# Patient Record
Sex: Female | Born: 1970 | Race: White | Hispanic: No | Marital: Single | Smoking: Current every day smoker
Health system: Southern US, Community
[De-identification: ages and names within clinical notes are randomized; demographics above are authoritative.]

## PROBLEM LIST (undated history)

## (undated) DIAGNOSIS — IMO0001 Reserved for inherently not codable concepts without codable children: Secondary | ICD-10-CM

## (undated) DIAGNOSIS — R918 Other nonspecific abnormal finding of lung field: Secondary | ICD-10-CM

## (undated) DIAGNOSIS — K861 Other chronic pancreatitis: Secondary | ICD-10-CM

## (undated) DIAGNOSIS — K219 Gastro-esophageal reflux disease without esophagitis: Secondary | ICD-10-CM

## (undated) DIAGNOSIS — S2239XA Fracture of one rib, unspecified side, initial encounter for closed fracture: Secondary | ICD-10-CM

## (undated) DIAGNOSIS — F419 Anxiety disorder, unspecified: Secondary | ICD-10-CM

## (undated) DIAGNOSIS — N390 Urinary tract infection, site not specified: Secondary | ICD-10-CM

## (undated) DIAGNOSIS — S060X9A Concussion with loss of consciousness of unspecified duration, initial encounter: Secondary | ICD-10-CM

## (undated) DIAGNOSIS — S2241XA Multiple fractures of ribs, right side, initial encounter for closed fracture: Secondary | ICD-10-CM

## (undated) DIAGNOSIS — C3490 Malignant neoplasm of unspecified part of unspecified bronchus or lung: Secondary | ICD-10-CM

## (undated) HISTORY — PX: TONSILLECTOMY: SUR1361

## (undated) HISTORY — PX: EYE MUSCLE SURGERY: SHX370

## (undated) HISTORY — DX: Malignant neoplasm of unspecified part of unspecified bronchus or lung: C34.90

## (undated) HISTORY — PX: MYRINGOTOMY WITH TUBE PLACEMENT: SHX5663

## (undated) HISTORY — PX: ESOPHAGOGASTRODUODENOSCOPY: SHX1529

---

## 1998-03-18 ENCOUNTER — Other Ambulatory Visit: Admission: RE | Admit: 1998-03-18 | Discharge: 1998-03-18 | Payer: Self-pay | Admitting: Obstetrics and Gynecology

## 2006-01-08 ENCOUNTER — Emergency Department: Payer: Self-pay | Admitting: Internal Medicine

## 2006-01-09 ENCOUNTER — Emergency Department: Payer: Self-pay | Admitting: Internal Medicine

## 2006-01-12 ENCOUNTER — Emergency Department: Payer: Self-pay | Admitting: Emergency Medicine

## 2006-01-16 ENCOUNTER — Emergency Department: Payer: Self-pay | Admitting: Emergency Medicine

## 2008-11-05 ENCOUNTER — Ambulatory Visit: Payer: Self-pay | Admitting: Urology

## 2009-09-02 ENCOUNTER — Ambulatory Visit: Payer: Self-pay

## 2009-09-07 ENCOUNTER — Ambulatory Visit: Payer: Self-pay

## 2010-02-17 ENCOUNTER — Other Ambulatory Visit: Payer: Self-pay | Admitting: Family

## 2010-03-03 ENCOUNTER — Ambulatory Visit: Payer: Self-pay | Admitting: Gastroenterology

## 2011-01-26 ENCOUNTER — Ambulatory Visit: Payer: Self-pay

## 2011-02-13 ENCOUNTER — Other Ambulatory Visit: Payer: Self-pay | Admitting: Family Medicine

## 2011-04-12 ENCOUNTER — Ambulatory Visit: Payer: Self-pay

## 2011-05-18 ENCOUNTER — Ambulatory Visit: Payer: Self-pay | Admitting: Surgery

## 2011-05-23 LAB — PATHOLOGY REPORT

## 2012-12-23 ENCOUNTER — Emergency Department: Payer: Self-pay | Admitting: Emergency Medicine

## 2012-12-29 ENCOUNTER — Emergency Department: Payer: Self-pay | Admitting: Internal Medicine

## 2013-05-22 ENCOUNTER — Ambulatory Visit: Payer: Self-pay | Admitting: Gastroenterology

## 2013-05-28 ENCOUNTER — Ambulatory Visit: Payer: Self-pay | Admitting: Gastroenterology

## 2013-08-14 ENCOUNTER — Emergency Department: Payer: Self-pay | Admitting: Emergency Medicine

## 2013-08-22 ENCOUNTER — Emergency Department: Payer: Self-pay | Admitting: Emergency Medicine

## 2013-08-22 LAB — CBC WITH DIFFERENTIAL/PLATELET
Basophil #: 0.1 10*3/uL (ref 0.0–0.1)
Basophil %: 0.4 %
EOS ABS: 0 10*3/uL (ref 0.0–0.7)
EOS PCT: 0.2 %
HCT: 48.2 % — AB (ref 35.0–47.0)
HGB: 16.2 g/dL — AB (ref 12.0–16.0)
LYMPHS ABS: 1 10*3/uL (ref 1.0–3.6)
LYMPHS PCT: 6.7 %
MCH: 35.1 pg — ABNORMAL HIGH (ref 26.0–34.0)
MCHC: 33.5 g/dL (ref 32.0–36.0)
MCV: 105 fL — ABNORMAL HIGH (ref 80–100)
MONO ABS: 1 x10 3/mm — AB (ref 0.2–0.9)
MONOS PCT: 6.9 %
NEUTROS ABS: 13 10*3/uL — AB (ref 1.4–6.5)
NEUTROS PCT: 85.8 %
PLATELETS: 241 10*3/uL (ref 150–440)
RBC: 4.61 10*6/uL (ref 3.80–5.20)
RDW: 13.2 % (ref 11.5–14.5)
WBC: 15.1 10*3/uL — AB (ref 3.6–11.0)

## 2013-08-22 LAB — COMPREHENSIVE METABOLIC PANEL
ALT: 96 U/L — AB (ref 12–78)
ANION GAP: 8 (ref 7–16)
AST: 82 U/L — AB (ref 15–37)
Albumin: 4.5 g/dL (ref 3.4–5.0)
Alkaline Phosphatase: 125 U/L — ABNORMAL HIGH
BUN: 5 mg/dL — ABNORMAL LOW (ref 7–18)
Bilirubin,Total: 0.9 mg/dL (ref 0.2–1.0)
CHLORIDE: 99 mmol/L (ref 98–107)
CREATININE: 0.61 mg/dL (ref 0.60–1.30)
Calcium, Total: 9.8 mg/dL (ref 8.5–10.1)
Co2: 26 mmol/L (ref 21–32)
EGFR (African American): >60
Glucose: 105 mg/dL — ABNORMAL HIGH (ref 65–99)
OSMOLALITY: 264 (ref 275–301)
Potassium: 3.6 mmol/L (ref 3.5–5.1)
SODIUM: 133 mmol/L — AB (ref 136–145)
Total Protein: 9.4 g/dL — ABNORMAL HIGH (ref 6.4–8.2)

## 2013-08-22 LAB — LIPASE, BLOOD: Lipase: 671 U/L — ABNORMAL HIGH (ref 73–393)

## 2013-08-23 LAB — URINALYSIS, COMPLETE
Bilirubin,UR: NEGATIVE
Blood: NEGATIVE
GLUCOSE, UR: NEGATIVE mg/dL (ref 0–75)
Hyaline Cast: 27
LEUKOCYTE ESTERASE: NEGATIVE
Nitrite: NEGATIVE
PH: 5 (ref 4.5–8.0)
RBC, UR: NONE SEEN /HPF (ref 0–5)
SPECIFIC GRAVITY: 1.026 (ref 1.003–1.030)
Squamous Epithelial: 65
WBC UR: NONE SEEN /HPF (ref 0–5)

## 2013-11-08 ENCOUNTER — Encounter: Payer: Self-pay | Admitting: Podiatry

## 2013-11-08 ENCOUNTER — Ambulatory Visit (INDEPENDENT_AMBULATORY_CARE_PROVIDER_SITE_OTHER): Payer: Self-pay

## 2013-11-08 ENCOUNTER — Ambulatory Visit (INDEPENDENT_AMBULATORY_CARE_PROVIDER_SITE_OTHER): Payer: Self-pay | Admitting: Podiatry

## 2013-11-08 VITALS — BP 118/79 | HR 84 | Resp 16 | Ht 62.0 in | Wt 155.0 lb

## 2013-11-08 DIAGNOSIS — M722 Plantar fascial fibromatosis: Secondary | ICD-10-CM

## 2013-11-08 MED ORDER — DICLOFENAC SODIUM 75 MG PO TBEC: 75.0000 mg | DELAYED_RELEASE_TABLET | Freq: Two times a day (BID) | ORAL | Status: DC

## 2013-11-08 NOTE — Patient Instructions (Signed)

## 2013-11-08 NOTE — Progress Notes (Signed)
Subjective:     Patient ID: Tiffany Erickson, female   DOB: 1971-05-19, 43 y.o.   MRN: [id number]  Foot Pain   patient points to both heels stating they had been very painful for me and it's been going on for around 6 months states the left has been worse than the right   Review of Systems  All other systems reviewed and are negative.      Objective:   Physical Exam  Nursing note and vitals reviewed. Constitutional: She is oriented to person, place, and time.  Cardiovascular: Intact distal pulses.   Musculoskeletal: Normal range of motion.  Neurological: She is oriented to person, place, and time.  Skin: Skin is warm.   neurovascular status found to be intact with no equinus condition and good range of motion subtalar midtarsal joint. Patient has discomfort and inflammation at the insertion of the fascia to the calcaneus medial side of both heels with the left being worse than the right     Assessment:     Severe plantar fasciitis heel region both feet    Plan:     H&P and x-ray reviewed. Injected the plantar fascia both heel 3 mg Kenalog 5 mg like Marcaine mixture and applied fascially brace left and placed on diclofenac 75 mg twice a day. Reappoint in 1 week

## 2013-11-08 NOTE — Progress Notes (Signed)
   Subjective:    Patient ID: Tiffany Erickson, female    DOB: 04-07-1971, 43 y.o.   MRN: [id number]  HPI Comments: i have heel pain in both feet. i have had the pain for 6 months. The pain has gotten worse. It hurts to stand and walk. i havent done anything for my feet.  Foot Pain Associated symptoms include abdominal pain, nausea and vomiting.      Review of Systems  Gastrointestinal: Positive for nausea, vomiting, abdominal pain, diarrhea and constipation.       Bloating  Endocrine: Positive for heat intolerance.       Excessive thirst  Musculoskeletal:       Back pain Difficulty walking   Allergic/Immunologic: Positive for environmental allergies.  Hematological: Bruises/bleeds easily.  All other systems reviewed and are negative.      Objective:   Physical Exam        Assessment & Plan:

## 2013-11-15 ENCOUNTER — Ambulatory Visit (INDEPENDENT_AMBULATORY_CARE_PROVIDER_SITE_OTHER): Payer: Self-pay | Admitting: Podiatry

## 2013-11-15 ENCOUNTER — Encounter: Payer: Self-pay | Admitting: Podiatry

## 2013-11-15 DIAGNOSIS — M722 Plantar fascial fibromatosis: Secondary | ICD-10-CM

## 2013-11-15 NOTE — Progress Notes (Signed)
Subjective:     Patient ID: Tiffany Erickson, female   DOB: 15-Dec-1970, 43 y.o.   MRN: [id number]  HPI patient presents stating she is doing a lot better   Review of Systems     Objective:   Physical Exam Significant diminishment of discomfort upon pressure to the tendons on the bottom of the feet    Assessment:     Improve plantar fasciitis    Plan:     Did inject one area on the left and went ahead today and applied PBT insoles to try to keep the symptoms under control

## 2013-12-27 ENCOUNTER — Encounter: Payer: Self-pay | Admitting: Podiatry

## 2013-12-27 ENCOUNTER — Ambulatory Visit (INDEPENDENT_AMBULATORY_CARE_PROVIDER_SITE_OTHER): Payer: Self-pay | Admitting: Podiatry

## 2013-12-27 DIAGNOSIS — M722 Plantar fascial fibromatosis: Secondary | ICD-10-CM

## 2013-12-28 NOTE — Progress Notes (Signed)
Subjective:     Patient ID: Tiffany Erickson, female   DOB: Apr 16, 1971, 43 y.o.   MRN: [id number]  HPI patient states she is still having pain and desperate to get off the cement floors that she is still on. States she was really good for a little while   Review of Systems     Objective:   Physical Exam Neurovascular status unchanged with pain in the plantar aspect left heel at the insertion with minimal discomfort on the right    Assessment:     Continue plantar fasciitis left over right    Plan:     Discussed possible orthotics and did careful injection around the area dexamethasone Xylocaine and advised on ice therapy and supportive shoe gear usage

## 2014-07-04 ENCOUNTER — Ambulatory Visit: Payer: Self-pay | Admitting: Gastroenterology

## 2014-07-29 ENCOUNTER — Other Ambulatory Visit: Payer: Self-pay | Admitting: Gastroenterology

## 2014-07-31 ENCOUNTER — Other Ambulatory Visit: Payer: Self-pay | Admitting: Gastroenterology

## 2014-08-28 ENCOUNTER — Ambulatory Visit: Payer: Self-pay | Admitting: Gastroenterology

## 2014-09-25 ENCOUNTER — Emergency Department
Admit: 2014-09-25 | Disposition: A | Discharge status: Home / Self Care | Payer: Self-pay | Admitting: Emergency Medicine

## 2014-09-25 LAB — CBC
HCT: 50.6 % — AB (ref 35.0–47.0)
HGB: 17.1 g/dL — AB (ref 12.0–16.0)
MCH: 34.1 pg — AB (ref 26.0–34.0)
MCHC: 33.8 g/dL (ref 32.0–36.0)
MCV: 101 fL — ABNORMAL HIGH (ref 80–100)
Platelet: 256 10*3/uL (ref 150–440)
RBC: 5 10*6/uL (ref 3.80–5.20)
RDW: 12.5 % (ref 11.5–14.5)
WBC: 7.3 10*3/uL (ref 3.6–11.0)

## 2014-09-25 LAB — URINALYSIS, COMPLETE
BILIRUBIN, UR: NEGATIVE
BLOOD: NEGATIVE
Glucose,UR: NEGATIVE mg/dL (ref 0–75)
Ketone: NEGATIVE
Leukocyte Esterase: NEGATIVE
Nitrite: NEGATIVE
Ph: 7 (ref 4.5–8.0)
Protein: NEGATIVE
RBC,UR: NONE SEEN /HPF (ref 0–5)
Specific Gravity: 1.001 (ref 1.003–1.030)

## 2014-09-25 LAB — LIPASE, BLOOD: Lipase: 117 U/L — ABNORMAL HIGH

## 2014-09-26 LAB — COMPREHENSIVE METABOLIC PANEL
Albumin: 4.3 g/dL
Alkaline Phosphatase: 132 U/L — ABNORMAL HIGH
Anion Gap: 8 (ref 7–16)
Bilirubin,Total: 0.7 mg/dL
CO2: 26 mmol/L
Calcium, Total: 9.1 mg/dL
Chloride: 107 mmol/L
Creatinine: 0.59 mg/dL
Glucose: 130 mg/dL — ABNORMAL HIGH
Potassium: 3.8 mmol/L
SGOT(AST): 106 U/L — ABNORMAL HIGH
SGPT (ALT): 84 U/L — ABNORMAL HIGH
Sodium: 141 mmol/L
TOTAL PROTEIN: 8 g/dL

## 2015-02-03 ENCOUNTER — Emergency Department (HOSPITAL_COMMUNITY)
Admission: EM | Admit: 2015-02-03 | Discharge: 2015-02-03 | Disposition: A | Discharge status: Home / Self Care | Payer: No Typology Code available for payment source | Attending: Emergency Medicine | Admitting: Emergency Medicine

## 2015-02-03 ENCOUNTER — Encounter (HOSPITAL_COMMUNITY): Payer: Self-pay | Admitting: Emergency Medicine

## 2015-02-03 DIAGNOSIS — F419 Anxiety disorder, unspecified: Secondary | ICD-10-CM | POA: Diagnosis not present

## 2015-02-03 DIAGNOSIS — Z720 Tobacco use: Secondary | ICD-10-CM | POA: Diagnosis not present

## 2015-02-03 DIAGNOSIS — Z79899 Other long term (current) drug therapy: Secondary | ICD-10-CM | POA: Diagnosis not present

## 2015-02-03 DIAGNOSIS — Z8719 Personal history of other diseases of the digestive system: Secondary | ICD-10-CM | POA: Diagnosis not present

## 2015-02-03 DIAGNOSIS — R140 Abdominal distension (gaseous): Secondary | ICD-10-CM | POA: Diagnosis not present

## 2015-02-03 DIAGNOSIS — R1084 Generalized abdominal pain: Secondary | ICD-10-CM | POA: Diagnosis present

## 2015-02-03 DIAGNOSIS — R000 Tachycardia, unspecified: Secondary | ICD-10-CM | POA: Diagnosis not present

## 2015-02-03 DIAGNOSIS — R1013 Epigastric pain: Secondary | ICD-10-CM | POA: Insufficient documentation

## 2015-02-03 HISTORY — DX: Anxiety disorder, unspecified: F41.9

## 2015-02-03 LAB — LIPASE, BLOOD: Lipase: 66 U/L — ABNORMAL HIGH (ref 22–51)

## 2015-02-03 LAB — COMPREHENSIVE METABOLIC PANEL
ALBUMIN: 4.4 g/dL (ref 3.5–5.0)
ALT: 25 U/L (ref 14–54)
AST: 37 U/L (ref 15–41)
Alkaline Phosphatase: 103 U/L (ref 38–126)
Anion gap: 13 (ref 5–15)
BILIRUBIN TOTAL: 0.6 mg/dL (ref 0.3–1.2)
CHLORIDE: 100 mmol/L — AB (ref 101–111)
CO2: 23 mmol/L (ref 22–32)
Calcium: 9.2 mg/dL (ref 8.9–10.3)
Creatinine, Ser: 0.77 mg/dL (ref 0.44–1.00)
GFR calc Af Amer: >60 mL/min (ref >60)
GFR calc non Af Amer: >60 mL/min (ref >60)
GLUCOSE: 108 mg/dL — AB (ref 65–99)
POTASSIUM: 3.4 mmol/L — AB (ref 3.5–5.1)
Sodium: 136 mmol/L (ref 135–145)
TOTAL PROTEIN: 8.2 g/dL — AB (ref 6.5–8.1)

## 2015-02-03 LAB — CBC
HEMATOCRIT: 48.9 % — AB (ref 36.0–46.0)
Hemoglobin: 17 g/dL — ABNORMAL HIGH (ref 12.0–15.0)
MCH: 34.8 pg — ABNORMAL HIGH (ref 26.0–34.0)
MCHC: 34.8 g/dL (ref 30.0–36.0)
MCV: 100 fL (ref 78.0–100.0)
Platelets: 305 10*3/uL (ref 150–400)
RBC: 4.89 MIL/uL (ref 3.87–5.11)
RDW: 12 % (ref 11.5–15.5)
WBC: 8 10*3/uL (ref 4.0–10.5)

## 2015-02-03 LAB — URINALYSIS, ROUTINE W REFLEX MICROSCOPIC
BILIRUBIN URINE: NEGATIVE
GLUCOSE, UA: NEGATIVE mg/dL
HGB URINE DIPSTICK: NEGATIVE
Ketones, ur: NEGATIVE mg/dL
Leukocytes, UA: NEGATIVE
Nitrite: NEGATIVE
PH: 6.5 (ref 5.0–8.0)
Protein, ur: NEGATIVE mg/dL
SPECIFIC GRAVITY, URINE: 1.003 — AB (ref 1.005–1.030)
Urobilinogen, UA: 0.2 mg/dL (ref 0.0–1.0)

## 2015-02-03 MED ORDER — MORPHINE SULFATE (PF) 4 MG/ML IV SOLN
6.0000 mg | Freq: Once | INTRAVENOUS | Status: AC
  Administered 2015-02-03: 6 mg via INTRAVENOUS
  Filled 2015-02-03: qty 2

## 2015-02-03 MED ORDER — ONDANSETRON HCL 4 MG/2ML IJ SOLN
4.0000 mg | Freq: Once | INTRAMUSCULAR | Status: AC
  Administered 2015-02-03: 4 mg via INTRAVENOUS
  Filled 2015-02-03: qty 2

## 2015-02-03 MED ORDER — METOCLOPRAMIDE HCL 10 MG PO TABS: 10.0000 mg | ORAL_TABLET | Freq: Three times a day (TID) | ORAL | Status: DC | PRN

## 2015-02-03 MED ORDER — SODIUM CHLORIDE 0.9 % IV BOLUS (SEPSIS)
2000.0000 mL | Freq: Once | INTRAVENOUS | Status: AC
Start: 2015-02-03 — End: 2015-02-03
  Administered 2015-02-03: 2000 mL via INTRAVENOUS

## 2015-02-03 MED ORDER — RANITIDINE HCL 150 MG/10ML PO SYRP
300.0000 mg | ORAL_SOLUTION | Freq: Once | ORAL | Status: AC
  Administered 2015-02-03: 300 mg via ORAL
  Filled 2015-02-03: qty 20

## 2015-02-03 MED ORDER — ALUM & MAG HYDROXIDE-SIMETH 200-200-20 MG/5ML PO SUSP
30.0000 mL | Freq: Once | ORAL | Status: AC
  Administered 2015-02-03: 30 mL via ORAL
  Filled 2015-02-03: qty 30

## 2015-02-03 MED ORDER — TRAMADOL HCL 50 MG PO TABS
50.0000 mg | ORAL_TABLET | Freq: Two times a day (BID) | ORAL | Status: DC | PRN
Start: 2015-02-03 — End: 2015-06-04

## 2015-02-03 NOTE — ED Provider Notes (Signed)
CSN: [id number]     Arrival date & time 02/03/15  0213 History  This chart was scribed for Tiffany Balls, MD by Evelene Croon, ED Scribe. This patient was seen in room WA13/WA13 and the patient's care was started 3:36 AM.     Chief Complaint  Patient presents with  . Abdominal Pain   The history is provided by the patient. No language interpreter was used.    HPI Comments:  Tiffany Erickson is a 44 y.o. female with a history of pancreatitis (diagnosed in 2015), who presents to the Emergency Department complaining of 10/10 diffuse abdominal pain for 2 days.  She notes her pain radiates around to her back and her abdomen is distended. She likens her pain to past episodes of pancreatitis. She admits to drinking ~4 beers last night and drinks daily. She reports associated nausea and vomiting. Pt has taken phengran 2 days ago with minimal relief. Pt denies dysuria and hematuria. No alleviating factors noted.  Past Medical History  Diagnosis Date  . Pancreatitis   . Anxiety    History reviewed. No pertinent past surgical history. History reviewed. No pertinent family history. Social History  Substance Use Topics  . Smoking status: Current Every Day Smoker  . Smokeless tobacco: None  . Alcohol Use: Yes     Comment: drinks daily   OB History    No data available     Review of Systems  A complete 10 system review of systems was obtained and all systems are negative except as noted in the HPI and PMH.    Allergies  Sulfa antibiotics  Home Medications   Prior to Admission medications   Medication Sig Start Date End Date Taking? Authorizing Provider  ALPRAZolam Duanne Moron) 1 MG tablet Take 1 mg by mouth 3 (three) times daily as needed for anxiety.    Yes Historical Provider, MD  dicyclomine (BENTYL) 10 MG capsule Take 10 mg by mouth every 8 (eight) hours.   Yes Historical Provider, MD  Melatonin-Pyridoxine (MELATIN PO) Take 1 tablet by mouth daily.   Yes Historical Provider, MD  metoCLOPramide  (REGLAN) 10 MG tablet Take 10 mg by mouth daily as needed for nausea.   Yes Historical Provider, MD  pantoprazole (PROTONIX) 20 MG tablet Take 20 mg by mouth daily.   Yes Historical Provider, MD  diclofenac (VOLTAREN) 75 MG EC tablet Take 1 tablet (75 mg total) by mouth 2 (two) times daily. Patient not taking: Reported on 02/03/2015 11/08/13   Tamala Fothergill Regal, DPM   BP 124/87 mmHg  Pulse 113  Temp(Src) 98.4 F (36.9 C) (Oral)  Resp 19  SpO2 98%  LMP 01/03/2015 Physical Exam  Constitutional: She is oriented to person, place, and time. She appears well-developed and well-nourished. No distress.  HENT:  Head: Normocephalic and atraumatic.  Nose: Nose normal.  Mouth/Throat: Oropharynx is clear and moist. No oropharyngeal exudate.  Eyes: Conjunctivae and EOM are normal. Pupils are equal, round, and reactive to light. No scleral icterus.  Neck: Normal range of motion. Neck supple. No JVD present. No tracheal deviation present. No thyromegaly present.  Cardiovascular: Regular rhythm and normal heart sounds.  Exam reveals no gallop and no friction rub.   No murmur heard. Tachycardia  Pulmonary/Chest: Effort normal and breath sounds normal. No respiratory distress. She has no wheezes. She exhibits no tenderness.  Abdominal: Soft. Bowel sounds are normal. She exhibits distension. She exhibits no mass. There is tenderness (Mid epigastric ). There is no rebound and no  guarding.  Musculoskeletal: Normal range of motion. She exhibits no edema or tenderness.  Lymphadenopathy:    She has no cervical adenopathy.  Neurological: She is alert and oriented to person, place, and time. No cranial nerve deficit. She exhibits normal muscle tone.  Skin: Skin is warm and dry. No rash noted. No erythema. No pallor.  Nursing note and vitals reviewed.   ED Course  Procedures   DIAGNOSTIC STUDIES:  Oxygen Saturation is 100% on RA, normal by my interpretation.    COORDINATION OF CARE:  3:40 AM Discussed  treatment plan with pt at bedside and pt agreed to plan.  Labs Review Labs Reviewed  LIPASE, BLOOD - Abnormal; Notable for the following:    Lipase 66 (*)    All other components within normal limits  COMPREHENSIVE METABOLIC PANEL - Abnormal; Notable for the following:    Potassium 3.4 (*)    Chloride 100 (*)    Glucose, Bld 108 (*)    BUN <5 (*)    Total Protein 8.2 (*)    All other components within normal limits  CBC - Abnormal; Notable for the following:    Hemoglobin 17.0 (*)    HCT 48.9 (*)    MCH 34.8 (*)    All other components within normal limits  URINALYSIS, ROUTINE W REFLEX MICROSCOPIC (NOT AT [facility]) - Abnormal; Notable for the following:    APPearance CLOUDY (*)    Specific Gravity, Urine 1.003 (*)    All other components within normal limits    Imaging Review No results found. I have personally reviewed and evaluated these images and lab results as part of my medical decision-making.   EKG Interpretation None      MDM   Final diagnoses:  None    Patient presents to the ED for abdominal pain, she states is consistent with her pancreatitis.  This is due to heavy alcohol use. She drinks beer every day. Patient was given IV fluids as well as morphine for pain control. Also gave her medicine for gastritis in case this has a component. Will reevaluate.  Long discussion was held with the patient in reference to alcohol cessation. She demonstrates understanding. She was given second dose of morphine for pain control. She appears well and in no acute distress. Her tachycardia has now resolved. Her vital signs remain within her normal limits and she is safe for discharge.   I personally performed the services described in this documentation, which was scribed in my presence. The recorded information has been reviewed and is accurate.   Tiffany Balls, MD 02/03/15 [phone]

## 2015-02-03 NOTE — ED Notes (Addendum)
Pt states that she has hx of pancreatitis and feels as if she has been having a flare up tonight. Pt is a daily drinker and has been drinking tonight. Alert and oriented. Generalized abdominal pain.

## 2015-02-03 NOTE — Discharge Instructions (Signed)
Alcohol Use Disorder Tiffany Erickson, your abdominal pain is likely related to drinking alcohol everyday.  See a primary care doctor within 3 days for help to quit drinking alcohol completely.  You can also follow up with GI for further evaluation of your abdominal pain. If symptoms worsen, come back to the ED immediately.  Thank you. Alcohol use disorder is a mental disorder. It is not a one-time incident of heavy drinking. Alcohol use disorder is the excessive and uncontrollable use of alcohol over time that leads to problems with functioning in one or more areas of daily living. People with this disorder risk harming themselves and others when they drink to excess. Alcohol use disorder also can cause other mental disorders, such as mood and anxiety disorders, and serious physical problems. People with alcohol use disorder often misuse other drugs.  Alcohol use disorder is common and widespread. Some people with this disorder drink alcohol to cope with or escape from negative life events. Others drink to relieve chronic pain or symptoms of mental illness. People with a family history of alcohol use disorder are at higher risk of losing control and using alcohol to excess.  SYMPTOMS  Signs and symptoms of alcohol use disorder may include the following:   Consumption ofalcohol inlarger amounts or over a longer period of time than intended.  Multiple unsuccessful attempts to cutdown or control alcohol use.   A great deal of time spent obtaining alcohol, using alcohol, or recovering from the effects of alcohol (hangover).  A strong desire or urge to use alcohol (cravings).   Continued use of alcohol despite problems at work, school, or home because of alcohol use.   Continued use of alcohol despite problems in relationships because of alcohol use.  Continued use of alcohol in situations when it is physically hazardous, such as driving a car.  Continued use of alcohol despite awareness of a  physical or psychological problem that is likely related to alcohol use. Physical problems related to alcohol use can involve the brain, heart, liver, stomach, and intestines. Psychological problems related to alcohol use include intoxication, depression, anxiety, psychosis, delirium, and dementia.   The need for increased amounts of alcohol to achieve the same desired effect, or a decreased effect from the consumption of the same amount of alcohol (tolerance).  Withdrawal symptoms upon reducing or stopping alcohol use, or alcohol use to reduce or avoid withdrawal symptoms. Withdrawal symptoms include:  Racing heart.  Hand tremor.  Difficulty sleeping.  Nausea.  Vomiting.  Hallucinations.  Restlessness.  Seizures. DIAGNOSIS Alcohol use disorder is diagnosed through an assessment by your health care provider. Your health care provider may start by asking three or four questions to screen for excessive or problematic alcohol use. To confirm a diagnosis of alcohol use disorder, at least two symptoms must be present within a 37-monthperiod. The severity of alcohol use disorder depends on the number of symptoms:  Mild--two or three.  Moderate--four or five.  Severe--six or more. Your health care provider may perform a physical exam or use results from lab tests to see if you have physical problems resulting from alcohol use. Your health care provider may refer you to a mental health professional for evaluation. TREATMENT  Some people with alcohol use disorder are able to reduce their alcohol use to low-risk levels. Some people with alcohol use disorder need to quit drinking alcohol. When necessary, mental health professionals with specialized training in substance use treatment can help. Your health care provider can  help you decide how severe your alcohol use disorder is and what type of treatment you need. The following forms of treatment are available:   Detoxification.  Detoxification involves the use of prescription medicines to prevent alcohol withdrawal symptoms in the first week after quitting. This is important for people with a history of symptoms of withdrawal and for heavy drinkers who are likely to have withdrawal symptoms. Alcohol withdrawal can be dangerous and, in severe cases, cause death. Detoxification is usually provided in a hospital or in-patient substance use treatment facility.  Counseling or talk therapy. Talk therapy is provided by substance use treatment counselors. It addresses the reasons people use alcohol and ways to keep them from drinking again. The goals of talk therapy are to help people with alcohol use disorder find healthy activities and ways to cope with life stress, to identify and avoid triggers for alcohol use, and to handle cravings, which can cause relapse.  Medicines.Different medicines can help treat alcohol use disorder through the following actions:  Decrease alcohol cravings.  Decrease the positive reward response felt from alcohol use.  Produce an uncomfortable physical reaction when alcohol is used (aversion therapy).  Support groups. Support groups are run by people who have quit drinking. They provide emotional support, advice, and guidance. These forms of treatment are often combined. Some people with alcohol use disorder benefit from intensive combination treatment provided by specialized substance use treatment centers. Both inpatient and outpatient treatment programs are available. Document Released: 06/30/2004 Document Revised: 10/07/2013 Document Reviewed: 08/30/2012 [facility] Patient Information 2015 [place], [place]. This information is not intended to replace advice given to you by your health care provider. Make sure you discuss any questions you have with your health care provider. Abdominal Pain, Women Abdominal (stomach, pelvic, or belly) pain can be caused by many things. It is important to tell your  doctor:  The location of the pain.  Does it come and go or is it present all the time?  Are there things that start the pain (eating certain foods, exercise)?  Are there other symptoms associated with the pain (fever, nausea, vomiting, diarrhea)? All of this is helpful to know when trying to find the cause of the pain. CAUSES   Stomach: virus or bacteria infection, or ulcer.  Intestine: appendicitis (inflamed appendix), regional ileitis (Crohn's disease), ulcerative colitis (inflamed colon), irritable bowel syndrome, diverticulitis (inflamed diverticulum of the colon), or cancer of the stomach or intestine.  Gallbladder disease or stones in the gallbladder.  Kidney disease, kidney stones, or infection.  Pancreas infection or cancer.  Fibromyalgia (pain disorder).  Diseases of the female organs:  Uterus: fibroid (non-cancerous) tumors or infection.  Fallopian tubes: infection or tubal pregnancy.  Ovary: cysts or tumors.  Pelvic adhesions (scar tissue).  Endometriosis (uterus lining tissue growing in the pelvis and on the pelvic organs).  Pelvic congestion syndrome (female organs filling up with blood just before the menstrual period).  Pain with the menstrual period.  Pain with ovulation (producing an egg).  Pain with an IUD (intrauterine device, birth control) in the uterus.  Cancer of the female organs.  Functional pain (pain not caused by a disease, may improve without treatment).  Psychological pain.  Depression. DIAGNOSIS  Your doctor will decide the seriousness of your pain by doing an examination.  Blood tests.  X-rays.  Ultrasound.  CT scan (computed tomography, special type of X-ray).  MRI (magnetic resonance imaging).  Cultures, for infection.  Barium enema (dye inserted in the large intestine,  to better view it with X-rays).  Colonoscopy (looking in intestine with a lighted tube).  Laparoscopy (minor surgery, looking in abdomen with a  lighted tube).  Major abdominal exploratory surgery (looking in abdomen with a large incision). TREATMENT  The treatment will depend on the cause of the pain.   Many cases can be observed and treated at home.  Over-the-counter medicines recommended by your caregiver.  Prescription medicine.  Antibiotics, for infection.  Birth control pills, for painful periods or for ovulation pain.  Hormone treatment, for endometriosis.  Nerve blocking injections.  Physical therapy.  Antidepressants.  Counseling with a psychologist or psychiatrist.  Minor or major surgery. HOME CARE INSTRUCTIONS   Do not take laxatives, unless directed by your caregiver.  Take over-the-counter pain medicine only if ordered by your caregiver. Do not take aspirin because it can cause an upset stomach or bleeding.  Try a clear liquid diet (broth or water) as ordered by your caregiver. Slowly move to a bland diet, as tolerated, if the pain is related to the stomach or intestine.  Have a thermometer and take your temperature several times a day, and record it.  Bed rest and sleep, if it helps the pain.  Avoid sexual intercourse, if it causes pain.  Avoid stressful situations.  Keep your follow-up appointments and tests, as your caregiver orders.  If the pain does not go away with medicine or surgery, you may try:  Acupuncture.  Relaxation exercises (yoga, meditation).  Group therapy.  Counseling. SEEK MEDICAL CARE IF:   You notice certain foods cause stomach pain.  Your home care treatment is not helping your pain.  You need stronger pain medicine.  You want your IUD removed.  You feel faint or lightheaded.  You develop nausea and vomiting.  You develop a rash.  You are having side effects or an allergy to your medicine. SEEK IMMEDIATE MEDICAL CARE IF:   Your pain does not go away or gets worse.  You have a fever.  Your pain is felt only in portions of the abdomen. The right  side could possibly be appendicitis. The left lower portion of the abdomen could be colitis or diverticulitis.  You are passing blood in your stools (bright red or black tarry stools, with or without vomiting).  You have blood in your urine.  You develop chills, with or without a fever.  You pass out. MAKE SURE YOU:   Understand these instructions.  Will watch your condition.  Will get help right away if you are not doing well or get worse. Document Released: 03/20/2007 Document Revised: 10/07/2013 Document Reviewed: 04/09/2009 [facility] Patient Information 2015 [place], [place]. This information is not intended to replace advice given to you by your health care provider. Make sure you discuss any questions you have with your health care provider.

## 2015-05-07 DIAGNOSIS — S2249XA Multiple fractures of ribs, unspecified side, initial encounter for closed fracture: Secondary | ICD-10-CM

## 2015-05-07 HISTORY — DX: Multiple fractures of ribs, unspecified side, initial encounter for closed fracture: S22.49XA

## 2015-06-01 ENCOUNTER — Inpatient Hospital Stay (HOSPITAL_COMMUNITY)
Admission: EM | Admit: 2015-06-01 | Discharge: 2015-06-04 | DRG: 184 | Disposition: A | Discharge status: Home Health | Payer: No Typology Code available for payment source | Attending: General Surgery | Admitting: General Surgery

## 2015-06-01 ENCOUNTER — Emergency Department (HOSPITAL_COMMUNITY): Payer: No Typology Code available for payment source

## 2015-06-01 ENCOUNTER — Encounter (HOSPITAL_COMMUNITY): Payer: Self-pay | Admitting: Emergency Medicine

## 2015-06-01 DIAGNOSIS — S060X0A Concussion without loss of consciousness, initial encounter: Secondary | ICD-10-CM | POA: Diagnosis present

## 2015-06-01 DIAGNOSIS — R000 Tachycardia, unspecified: Secondary | ICD-10-CM | POA: Diagnosis present

## 2015-06-01 DIAGNOSIS — S2232XA Fracture of one rib, left side, initial encounter for closed fracture: Secondary | ICD-10-CM

## 2015-06-01 DIAGNOSIS — Y92009 Unspecified place in unspecified non-institutional (private) residence as the place of occurrence of the external cause: Secondary | ICD-10-CM | POA: Diagnosis not present

## 2015-06-01 DIAGNOSIS — Z882 Allergy status to sulfonamides status: Secondary | ICD-10-CM

## 2015-06-01 DIAGNOSIS — W19XXXA Unspecified fall, initial encounter: Secondary | ICD-10-CM | POA: Diagnosis present

## 2015-06-01 DIAGNOSIS — S060X9A Concussion with loss of consciousness of unspecified duration, initial encounter: Secondary | ICD-10-CM

## 2015-06-01 DIAGNOSIS — Z79899 Other long term (current) drug therapy: Secondary | ICD-10-CM

## 2015-06-01 DIAGNOSIS — F1010 Alcohol abuse, uncomplicated: Secondary | ICD-10-CM | POA: Diagnosis present

## 2015-06-01 DIAGNOSIS — M549 Dorsalgia, unspecified: Secondary | ICD-10-CM | POA: Diagnosis present

## 2015-06-01 DIAGNOSIS — S2242XA Multiple fractures of ribs, left side, initial encounter for closed fracture: Principal | ICD-10-CM | POA: Diagnosis present

## 2015-06-01 DIAGNOSIS — F17200 Nicotine dependence, unspecified, uncomplicated: Secondary | ICD-10-CM | POA: Diagnosis present

## 2015-06-01 DIAGNOSIS — S2241XA Multiple fractures of ribs, right side, initial encounter for closed fracture: Secondary | ICD-10-CM

## 2015-06-01 DIAGNOSIS — S060XAA Concussion with loss of consciousness status unknown, initial encounter: Secondary | ICD-10-CM

## 2015-06-01 DIAGNOSIS — S2249XA Multiple fractures of ribs, unspecified side, initial encounter for closed fracture: Secondary | ICD-10-CM | POA: Diagnosis present

## 2015-06-01 DIAGNOSIS — R918 Other nonspecific abnormal finding of lung field: Secondary | ICD-10-CM | POA: Diagnosis present

## 2015-06-01 DIAGNOSIS — F419 Anxiety disorder, unspecified: Secondary | ICD-10-CM | POA: Diagnosis present

## 2015-06-01 DIAGNOSIS — K861 Other chronic pancreatitis: Secondary | ICD-10-CM | POA: Diagnosis present

## 2015-06-01 DIAGNOSIS — Y93E9 Activity, other interior property and clothing maintenance: Secondary | ICD-10-CM | POA: Diagnosis not present

## 2015-06-01 DIAGNOSIS — W11XXXA Fall on and from ladder, initial encounter: Secondary | ICD-10-CM | POA: Diagnosis present

## 2015-06-01 HISTORY — DX: Multiple fractures of ribs, right side, initial encounter for closed fracture: S22.41XA

## 2015-06-01 HISTORY — DX: Concussion with loss of consciousness of unspecified duration, initial encounter: S06.0X9A

## 2015-06-01 HISTORY — DX: Urinary tract infection, site not specified: N39.0

## 2015-06-01 HISTORY — DX: Gastro-esophageal reflux disease without esophagitis: K21.9

## 2015-06-01 HISTORY — DX: Other nonspecific abnormal finding of lung field: R91.8

## 2015-06-01 HISTORY — DX: Concussion with loss of consciousness status unknown, initial encounter: S06.0XAA

## 2015-06-01 HISTORY — DX: Other chronic pancreatitis: K86.1

## 2015-06-01 LAB — CBC
HCT: 52.2 % — ABNORMAL HIGH (ref 36.0–46.0)
HEMOGLOBIN: 17.2 g/dL — AB (ref 12.0–15.0)
MCH: 33 pg (ref 26.0–34.0)
MCHC: 33 g/dL (ref 30.0–36.0)
MCV: 100.2 fL — ABNORMAL HIGH (ref 78.0–100.0)
PLATELETS: 272 10*3/uL (ref 150–400)
RBC: 5.21 MIL/uL — AB (ref 3.87–5.11)
RDW: 13.4 % (ref 11.5–15.5)
WBC: 10.6 10*3/uL — ABNORMAL HIGH (ref 4.0–10.5)

## 2015-06-01 LAB — COMPREHENSIVE METABOLIC PANEL
ALBUMIN: 3.7 g/dL (ref 3.5–5.0)
ALK PHOS: 129 U/L — AB (ref 38–126)
ALT: 38 U/L (ref 14–54)
ANION GAP: 14 (ref 5–15)
AST: 46 U/L — ABNORMAL HIGH (ref 15–41)
BILIRUBIN TOTAL: 0.4 mg/dL (ref 0.3–1.2)
BUN: <5 mg/dL — ABNORMAL LOW (ref 6–20)
CALCIUM: 9 mg/dL (ref 8.9–10.3)
CO2: 23 mmol/L (ref 22–32)
CREATININE: 0.58 mg/dL (ref 0.44–1.00)
Chloride: 105 mmol/L (ref 101–111)
Glucose, Bld: 105 mg/dL — ABNORMAL HIGH (ref 65–99)
Potassium: 3.7 mmol/L (ref 3.5–5.1)
SODIUM: 142 mmol/L (ref 135–145)
TOTAL PROTEIN: 7.5 g/dL (ref 6.5–8.1)

## 2015-06-01 LAB — I-STAT BETA HCG BLOOD, ED (MC, WL, AP ONLY): I-stat hCG, quantitative: <5 m[IU]/mL (ref <5)

## 2015-06-01 LAB — PROTIME-INR
INR: 1.12 (ref 0.00–1.49)
PROTHROMBIN TIME: 14.6 s (ref 11.6–15.2)

## 2015-06-01 LAB — ETHANOL: ALCOHOL ETHYL (B): 214 mg/dL — AB (ref <5)

## 2015-06-01 LAB — CDS SEROLOGY

## 2015-06-01 MED ORDER — LORAZEPAM 1 MG PO TABS
0.0000 mg | ORAL_TABLET | Freq: Two times a day (BID) | ORAL | Status: DC
  Administered 2015-06-03: 2 mg via ORAL
  Filled 2015-06-01: qty 2

## 2015-06-01 MED ORDER — TETANUS-DIPHTH-ACELL PERTUSSIS 5-2.5-18.5 LF-MCG/0.5 IM SUSP
0.5000 mL | Freq: Once | INTRAMUSCULAR | Status: AC
  Administered 2015-06-01: 0.5 mL via INTRAMUSCULAR
  Filled 2015-06-01: qty 0.5

## 2015-06-01 MED ORDER — ONDANSETRON HCL 4 MG/2ML IJ SOLN
4.0000 mg | Freq: Once | INTRAMUSCULAR | Status: AC
  Administered 2015-06-01: 4 mg via INTRAVENOUS
  Filled 2015-06-01: qty 2

## 2015-06-01 MED ORDER — KCL IN DEXTROSE-NACL 20-5-0.45 MEQ/L-%-% IV SOLN
INTRAVENOUS | Status: DC
  Administered 2015-06-01 – 2015-06-02 (×2): via INTRAVENOUS
  Filled 2015-06-01 (×2): qty 1000

## 2015-06-01 MED ORDER — ALPRAZOLAM 0.25 MG PO TABS
1.0000 mg | ORAL_TABLET | Freq: Once | ORAL | Status: AC
  Administered 2015-06-01: 1 mg via ORAL
  Filled 2015-06-01: qty 4

## 2015-06-01 MED ORDER — MORPHINE SULFATE (PF) 4 MG/ML IV SOLN
4.0000 mg | Freq: Once | INTRAVENOUS | Status: AC
  Administered 2015-06-01: 4 mg via INTRAVENOUS
  Filled 2015-06-01: qty 1

## 2015-06-01 MED ORDER — FOLIC ACID 1 MG PO TABS
1.0000 mg | ORAL_TABLET | Freq: Every day | ORAL | Status: DC
Start: 2015-06-01 — End: 2015-06-04
  Administered 2015-06-01 – 2015-06-04 (×4): 1 mg via ORAL
  Filled 2015-06-01 (×4): qty 1

## 2015-06-01 MED ORDER — LORAZEPAM 2 MG/ML IJ SOLN: 1.0000 mg | Freq: Four times a day (QID) | INTRAMUSCULAR | Status: AC | PRN

## 2015-06-01 MED ORDER — LORAZEPAM 1 MG PO TABS
0.0000 mg | ORAL_TABLET | Freq: Four times a day (QID) | ORAL | Status: AC
  Administered 2015-06-02 – 2015-06-03 (×2): 2 mg via ORAL
  Filled 2015-06-01 (×2): qty 2

## 2015-06-01 MED ORDER — ADULT MULTIVITAMIN W/MINERALS CH
1.0000 | ORAL_TABLET | Freq: Every day | ORAL | Status: DC
  Administered 2015-06-01 – 2015-06-04 (×4): 1 via ORAL
  Filled 2015-06-01 (×4): qty 1

## 2015-06-01 MED ORDER — VITAMIN B-1 100 MG PO TABS
100.0000 mg | ORAL_TABLET | Freq: Every day | ORAL | Status: DC
  Administered 2015-06-01 – 2015-06-04 (×4): 100 mg via ORAL
  Filled 2015-06-01 (×4): qty 1

## 2015-06-01 MED ORDER — OXYCODONE HCL 5 MG PO TABS
10.0000 mg | ORAL_TABLET | ORAL | Status: DC | PRN
  Administered 2015-06-01 – 2015-06-02 (×5): 10 mg via ORAL
  Filled 2015-06-01 (×5): qty 2

## 2015-06-01 MED ORDER — MELATONIN 5 MG PO TABS: 5.0000 mg | ORAL_TABLET | Freq: Every evening | ORAL | Status: DC | PRN

## 2015-06-01 MED ORDER — ONDANSETRON HCL 4 MG/2ML IJ SOLN
4.0000 mg | Freq: Four times a day (QID) | INTRAMUSCULAR | Status: DC | PRN
  Administered 2015-06-01 – 2015-06-02 (×4): 4 mg via INTRAVENOUS
  Filled 2015-06-01 (×4): qty 2

## 2015-06-01 MED ORDER — IOHEXOL 300 MG/ML  SOLN
100.0000 mL | Freq: Once | INTRAMUSCULAR | Status: AC | PRN
  Administered 2015-06-01: 100 mL via INTRAVENOUS

## 2015-06-01 MED ORDER — TRAMADOL HCL 50 MG PO TABS
100.0000 mg | ORAL_TABLET | Freq: Four times a day (QID) | ORAL | Status: DC | PRN
  Administered 2015-06-01: 100 mg via ORAL
  Filled 2015-06-01: qty 2

## 2015-06-01 MED ORDER — ACETAMINOPHEN 325 MG PO TABS: 650.0000 mg | ORAL_TABLET | ORAL | Status: DC | PRN

## 2015-06-01 MED ORDER — HYDROMORPHONE HCL 1 MG/ML IJ SOLN
0.5000 mg | INTRAMUSCULAR | Status: DC | PRN
  Administered 2015-06-01 – 2015-06-02 (×4): 1 mg via INTRAVENOUS
  Filled 2015-06-01 (×3): qty 1

## 2015-06-01 MED ORDER — ALPRAZOLAM 0.5 MG PO TABS
0.5000 mg | ORAL_TABLET | Freq: Three times a day (TID) | ORAL | Status: DC | PRN
  Administered 2015-06-01 – 2015-06-04 (×4): 1 mg via ORAL
  Filled 2015-06-01 (×4): qty 2

## 2015-06-01 MED ORDER — PANTOPRAZOLE SODIUM 20 MG PO TBEC
20.0000 mg | DELAYED_RELEASE_TABLET | Freq: Every day | ORAL | Status: DC
  Administered 2015-06-01 – 2015-06-04 (×4): 20 mg via ORAL
  Filled 2015-06-01 (×5): qty 1

## 2015-06-01 MED ORDER — METOCLOPRAMIDE HCL 10 MG PO TABS
10.0000 mg | ORAL_TABLET | Freq: Three times a day (TID) | ORAL | Status: DC | PRN
  Administered 2015-06-02: 10 mg via ORAL
  Filled 2015-06-01: qty 1

## 2015-06-01 MED ORDER — LORAZEPAM 1 MG PO TABS
1.0000 mg | ORAL_TABLET | Freq: Four times a day (QID) | ORAL | Status: AC | PRN
  Administered 2015-06-02 (×2): 1 mg via ORAL
  Filled 2015-06-01 (×2): qty 1

## 2015-06-01 MED ORDER — OXYCODONE HCL 5 MG PO TABS: 5.0000 mg | ORAL_TABLET | ORAL | Status: DC | PRN

## 2015-06-01 MED ORDER — THIAMINE HCL 100 MG/ML IJ SOLN
100.0000 mg | Freq: Every day | INTRAMUSCULAR | Status: DC
  Filled 2015-06-01: qty 2

## 2015-06-01 MED ORDER — ONDANSETRON HCL 4 MG PO TABS: 4.0000 mg | ORAL_TABLET | Freq: Four times a day (QID) | ORAL | Status: DC | PRN

## 2015-06-01 MED ORDER — HYDROMORPHONE HCL 1 MG/ML IJ SOLN
INTRAMUSCULAR | Status: AC
  Filled 2015-06-01: qty 1

## 2015-06-01 MED ORDER — ENOXAPARIN SODIUM 40 MG/0.4ML ~~LOC~~ SOLN
40.0000 mg | SUBCUTANEOUS | Status: DC
  Administered 2015-06-01 – 2015-06-03 (×3): 40 mg via SUBCUTANEOUS
  Filled 2015-06-01 (×3): qty 0.4

## 2015-06-01 NOTE — ED Notes (Signed)
Pt oob to br with steady gait 

## 2015-06-01 NOTE — ED Notes (Signed)
Pt has $90.00 Cash, 1 Visa Card and 1 [place] License. And 1 White Gold [employer]. Checked with [facility]. Belongings put in a valuable envelope and given to Security.

## 2015-06-01 NOTE — ED Notes (Signed)
Pt fell over an eight foot banister. Pt admits to etoh aboard. Pt with chief complaint of head/neck/back pain. Pt with multiple abrasions/contusions on both her legs. Pt stated that she did not have a loss of consciousness before or after her fall. Pt CAO with stable vital signs.

## 2015-06-01 NOTE — ED Notes (Signed)
The patient has $90.00 in cas and VISA CARD and NCDL and white gold w/ diamond pendant. The RN IS AWARE.

## 2015-06-01 NOTE — ED Notes (Signed)
Pt. Ambulated in the hallway with ease. Pt. Independent with toileting. This RN assisted pt. To bathroom and back to bed. Pt. Free of dizziness/lightheadedness.

## 2015-06-01 NOTE — ED Notes (Signed)
Trauma surgeon at bedside.

## 2015-06-01 NOTE — ED Notes (Signed)
Pt instructed on use of incentive spirometer

## 2015-06-01 NOTE — H&P (Signed)
Tiffany Erickson is an 44 y.o. female.   Chief Complaint: L chest wall pain HPI: Tiffany Erickson was up on a stepladder trying to turn off a smoke detector when she fell over the railing on the stairs approximately 8 feet. She landed on her right side. She is amnestic to parts of the event and may have had loss of consciousness. She called her mother and then was transported via EMS for evaluation at the emergency department. She underwent a thorough evaluation. She was found to have multiple left-sided rib fractures as well as an incidental finding of a large right pulmonary mass, consistent with a lung cancer. Additionally, she was intoxicated. I was asked to see her for admission to the trauma service. She complains of anxiety and left sided chest pain. She was unaware about the right lung mass. She did have a couple days of hemoptysis earlier this month but this resolved spontaneously. She attributed this to a cold. She drinks between 3 and many beers daily.  Past Medical History  Diagnosis Date  . Pancreatitis   . Anxiety     History reviewed. No pertinent past surgical history.  History reviewed. No pertinent family history. Social History:  reports that she has been smoking.  She does not have any smokeless tobacco history on file. She reports that she drinks alcohol. Her drug history is not on file.  Allergies:  Allergies  Allergen Reactions  . Sulfa Antibiotics Other (See Comments)    unknown     (Not in a hospital admission)  Results for orders placed or performed during the hospital encounter of 06/01/15 (from the past 48 hour(s))  CDS serology     Status: None   Collection Time: 06/01/15  4:30 AM  Result Value Ref Range   CDS serology specimen      SPECIMEN WILL BE HELD FOR 14 DAYS IF TESTING IS REQUIRED  Comprehensive metabolic panel     Status: Abnormal   Collection Time: 06/01/15  4:30 AM  Result Value Ref Range   Sodium 142 135 - 145 mmol/L   Potassium 3.7 3.5 - 5.1 mmol/L    Chloride 105 101 - 111 mmol/L   CO2 23 22 - 32 mmol/L   Glucose, Bld 105 (H) 65 - 99 mg/dL   BUN <5 (L) 6 - 20 mg/dL   Creatinine, Ser 0.58 0.44 - 1.00 mg/dL   Calcium 9.0 8.9 - 10.3 mg/dL   Total Protein 7.5 6.5 - 8.1 g/dL   Albumin 3.7 3.5 - 5.0 g/dL   AST 46 (H) 15 - 41 U/L   ALT 38 14 - 54 U/L   Alkaline Phosphatase 129 (H) 38 - 126 U/L   Total Bilirubin 0.4 0.3 - 1.2 mg/dL   GFR calc non Af Amer >60 >60 mL/min   GFR calc Af Amer >60 >60 mL/min    Comment: (NOTE) The eGFR has been calculated using the CKD EPI equation. This calculation has not been validated in all clinical situations. eGFR's persistently <60 mL/min signify possible Chronic Kidney Disease.    Anion gap 14 5 - 15  CBC     Status: Abnormal   Collection Time: 06/01/15  4:30 AM  Result Value Ref Range   WBC 10.6 (H) 4.0 - 10.5 K/uL   RBC 5.21 (H) 3.87 - 5.11 MIL/uL   Hemoglobin 17.2 (H) 12.0 - 15.0 g/dL   HCT 52.2 (H) 36.0 - 46.0 %   MCV 100.2 (H) 78.0 - 100.0 fL  MCH 33.0 26.0 - 34.0 pg   MCHC 33.0 30.0 - 36.0 g/dL   RDW 13.4 11.5 - 15.5 %   Platelets 272 150 - 400 K/uL  Ethanol     Status: Abnormal   Collection Time: 06/01/15  4:30 AM  Result Value Ref Range   Alcohol, Ethyl (B) 214 (H) <5 mg/dL    Comment:        LOWEST DETECTABLE LIMIT FOR SERUM ALCOHOL IS 5 mg/dL FOR MEDICAL PURPOSES ONLY   Protime-INR     Status: None   Collection Time: 06/01/15  4:30 AM  Result Value Ref Range   Prothrombin Time 14.6 11.6 - 15.2 seconds   INR 1.12 0.00 - 1.49  I-Stat Beta hCG blood, ED ([facility], [facility], [facility] only)     Status: None   Collection Time: 06/01/15  6:26 AM  Result Value Ref Range   I-stat hCG, quantitative <5.0 <5 mIU/mL   Comment 3            Comment:   GEST. AGE      CONC.  (mIU/mL)   <=1 WEEK        5 - 50     2 WEEKS       50 - 500     3 WEEKS       100 - 10,000     4 WEEKS     1,000 - 30,000        FEMALE AND NON-PREGNANT FEMALE:     LESS THAN 5 mIU/mL    Ct Head Wo Contrast  06/01/2015   CLINICAL DATA:  Status post fall 8 foot over banister. Unsure of loss of consciousness. Headaches and neck pain. EXAM: CT HEAD WITHOUT CONTRAST CT CERVICAL SPINE WITHOUT CONTRAST TECHNIQUE: Multidetector CT imaging of the head and cervical spine was performed following the standard protocol without intravenous contrast. Multiplanar CT image reconstructions of the cervical spine were also generated. COMPARISON:  December 23, 2012 head CT FINDINGS: CT HEAD FINDINGS There is no midline shift, hydrocephalus, or mass. No acute hemorrhage or acute transcortical infarct is identified. The bony calvarium is intact. The visualized sinuses are clear. There is left frontal parietal scalp hematoma. CT CERVICAL SPINE FINDINGS There is no acute fracture or dislocation. The prevertebral soft tissues are normal. There is no malalignment. There is a right apical lung mass measuring at least 4.8 cm, incompletely included. IMPRESSION: No focal acute intracranial abnormality identified. Left frontal/ parietal scalp hematoma. No acute fracture or dislocation of cervical spine. Right apical lung mass measuring at least 4.8 cm, incompletely included. Recommend further evaluation with chest CT. Electronically Signed   By: Abelardo Diesel M.D.   On: 06/01/2015 07:41   Ct Chest W Contrast  06/01/2015  CLINICAL DATA:  Left-sided chest and back pain after falling 8 feet. History of pancreatitis. EXAM: CT CHEST, ABDOMEN, AND PELVIS WITH CONTRAST TECHNIQUE: Multidetector CT imaging of the chest, abdomen and pelvis was performed following the standard protocol during bolus administration of intravenous contrast. CONTRAST:  169m OMNIPAQUE IOHEXOL 300 MG/ML  SOLN COMPARISON:  Abdominal pelvic CT 08/23/2013. Chest radiographs today. FINDINGS: CT CHEST Mediastinum/Nodes: The heart and great vessels appear unremarkable. There is no evidence of acute vascular injury or mediastinal hematoma. There is a 12 mm right paratracheal node on image number 17 and  a 9 mm right hilar node on image 21. The thyroid gland, trachea and esophagus demonstrate no significant findings. The heart size is normal. There is no pericardial  effusion. Lungs/Pleura: There is no pleural effusion or pneumothorax.There is a large right upper lobe mass, measuring 6.0 x 4.6 cm. This has slightly irregular margins, although is otherwise well circumscribed and is concerning for malignancy rather than pulmonary contusion. Minimal atelectasis at the left lung base. No other suspicious pulmonary nodules. Musculoskeletal/Chest wall: There are several nondisplaced left-sided rib fractures posteriorly. No rib fractures are seen on the right. There is no evidence of acute spinal injury. There is a 2.3 cm water density lesion anteriorly in the left breast which appears cystic. CT ABDOMEN AND PELVIS FINDINGS Hepatobiliary: The liver is normal in density without focal abnormality. No evidence of gallstones, gallbladder wall thickening or biliary dilatation. Pancreas: Unremarkable. No pancreatic ductal dilatation or surrounding inflammatory changes. Spleen: Normal in size without focal abnormality. Adrenals/Urinary Tract: Both adrenal glands appear normal. The kidneys appear normal without evidence of urinary tract calculus, suspicious lesion or hydronephrosis. No bladder abnormalities are seen. Stomach/Bowel: No evidence of bowel wall thickening, distention or surrounding inflammatory change. No evidence of bowel or mesenteric injury. The appendix appears normal. Vascular/Lymphatic: There are small lymph nodes within the porta hepatis and retroperitoneum, not pathologically enlarged. There is mild aortoiliac atherosclerosis. No evidence of retroperitoneal hematoma. Reproductive: Unremarkable. Other: No evidence of abdominal wall mass or hernia. Musculoskeletal: No acute or significant osseous findings. IMPRESSION: 1. There are several nondisplaced posterior left rib fractures. Mild associated left lower lobe  atelectasis. No pneumothorax or significant pleural effusion. 2. Large right upper lobe mass is highly concerning for malignancy. Pulmonary contusion is considered less likely given the absence of associated right chest wall trauma and adjacent prominent right hilar and peritracheal lymph nodes. Close short-term follow-up recommended. 3. No acute or significant findings demonstrated within the abdomen or pelvis. Electronically Signed   By: Richardean Sale M.D.   On: 06/01/2015 07:43   Ct Cervical Spine Wo Contrast  06/01/2015  CLINICAL DATA:  Status post fall 8 foot over banister. Unsure of loss of consciousness. Headaches and neck pain. EXAM: CT HEAD WITHOUT CONTRAST CT CERVICAL SPINE WITHOUT CONTRAST TECHNIQUE: Multidetector CT imaging of the head and cervical spine was performed following the standard protocol without intravenous contrast. Multiplanar CT image reconstructions of the cervical spine were also generated. COMPARISON:  December 23, 2012 head CT FINDINGS: CT HEAD FINDINGS There is no midline shift, hydrocephalus, or mass. No acute hemorrhage or acute transcortical infarct is identified. The bony calvarium is intact. The visualized sinuses are clear. There is left frontal parietal scalp hematoma. CT CERVICAL SPINE FINDINGS There is no acute fracture or dislocation. The prevertebral soft tissues are normal. There is no malalignment. There is a right apical lung mass measuring at least 4.8 cm, incompletely included. IMPRESSION: No focal acute intracranial abnormality identified. Left frontal/ parietal scalp hematoma. No acute fracture or dislocation of cervical spine. Right apical lung mass measuring at least 4.8 cm, incompletely included. Recommend further evaluation with chest CT. Electronically Signed   By: Abelardo Diesel M.D.   On: 06/01/2015 07:41   Ct Abdomen Pelvis W Contrast  06/01/2015  CLINICAL DATA:  Left-sided chest and back pain after falling 8 feet. History of pancreatitis. EXAM: CT  CHEST, ABDOMEN, AND PELVIS WITH CONTRAST TECHNIQUE: Multidetector CT imaging of the chest, abdomen and pelvis was performed following the standard protocol during bolus administration of intravenous contrast. CONTRAST:  169m OMNIPAQUE IOHEXOL 300 MG/ML  SOLN COMPARISON:  Abdominal pelvic CT 08/23/2013. Chest radiographs today. FINDINGS: CT CHEST Mediastinum/Nodes: The heart and great vessels appear  unremarkable. There is no evidence of acute vascular injury or mediastinal hematoma. There is a 12 mm right paratracheal node on image number 17 and a 9 mm right hilar node on image 21. The thyroid gland, trachea and esophagus demonstrate no significant findings. The heart size is normal. There is no pericardial effusion. Lungs/Pleura: There is no pleural effusion or pneumothorax.There is a large right upper lobe mass, measuring 6.0 x 4.6 cm. This has slightly irregular margins, although is otherwise well circumscribed and is concerning for malignancy rather than pulmonary contusion. Minimal atelectasis at the left lung base. No other suspicious pulmonary nodules. Musculoskeletal/Chest wall: There are several nondisplaced left-sided rib fractures posteriorly. No rib fractures are seen on the right. There is no evidence of acute spinal injury. There is a 2.3 cm water density lesion anteriorly in the left breast which appears cystic. CT ABDOMEN AND PELVIS FINDINGS Hepatobiliary: The liver is normal in density without focal abnormality. No evidence of gallstones, gallbladder wall thickening or biliary dilatation. Pancreas: Unremarkable. No pancreatic ductal dilatation or surrounding inflammatory changes. Spleen: Normal in size without focal abnormality. Adrenals/Urinary Tract: Both adrenal glands appear normal. The kidneys appear normal without evidence of urinary tract calculus, suspicious lesion or hydronephrosis. No bladder abnormalities are seen. Stomach/Bowel: No evidence of bowel wall thickening, distention or  surrounding inflammatory change. No evidence of bowel or mesenteric injury. The appendix appears normal. Vascular/Lymphatic: There are small lymph nodes within the porta hepatis and retroperitoneum, not pathologically enlarged. There is mild aortoiliac atherosclerosis. No evidence of retroperitoneal hematoma. Reproductive: Unremarkable. Other: No evidence of abdominal wall mass or hernia. Musculoskeletal: No acute or significant osseous findings. IMPRESSION: 1. There are several nondisplaced posterior left rib fractures. Mild associated left lower lobe atelectasis. No pneumothorax or significant pleural effusion. 2. Large right upper lobe mass is highly concerning for malignancy. Pulmonary contusion is considered less likely given the absence of associated right chest wall trauma and adjacent prominent right hilar and peritracheal lymph nodes. Close short-term follow-up recommended. 3. No acute or significant findings demonstrated within the abdomen or pelvis. Electronically Signed   By: Richardean Sale M.D.   On: 06/01/2015 07:43   Dg Pelvis Portable  06/01/2015  CLINICAL DATA:  Status post fall over 8 foot bannister, with concern for pelvic injury. Initial encounter. EXAM: PORTABLE PELVIS 1-2 VIEWS COMPARISON:  CT of the abdomen and pelvis performed 08/23/2013 FINDINGS: There is no evidence of fracture or dislocation. Both femoral heads are seated normally within their respective acetabula. No significant degenerative change is appreciated. The sacroiliac joints are unremarkable in appearance. The visualized bowel gas pattern is grossly unremarkable in appearance. IMPRESSION: No evidence of fracture or dislocation. Electronically Signed   By: Garald Balding M.D.   On: 06/01/2015 03:32   Dg Chest Portable 1 View  06/01/2015  CLINICAL DATA:  44 year old female with fall and multiple operations. EXAM: PORTABLE CHEST 1 VIEW COMPARISON:  None. FINDINGS: The heart size and mediastinal contours are within normal  limits. Both lungs are clear. The visualized skeletal structures are unremarkable. IMPRESSION: No active disease. Electronically Signed   By: Anner Crete M.D.   On: 06/01/2015 03:32    Review of Systems  Constitutional: Negative for fever and chills.  Eyes: Negative.   Respiratory: Positive for cough and hemoptysis.        Earlier this month  Cardiovascular: Positive for chest pain.       Acute L side  Gastrointestinal: Positive for nausea and vomiting.  Chronic nausea and vomiting, has undergone extensive workup for chronic pancreatitis  Genitourinary: Negative.   Musculoskeletal: Negative.   Skin: Negative.   Neurological: Positive for loss of consciousness.       Possible loss of consciousness  Endo/Heme/Allergies: Negative.   Psychiatric/Behavioral: Negative.     Blood pressure 120/85, pulse 106, temperature 98.3 F (36.8 C), temperature source Oral, resp. rate 21, last menstrual period 05/11/2015, SpO2 93 %. Physical Exam  Constitutional: She appears well-developed and well-nourished. No distress.  HENT:  Head: Head is with contusion. Head is without abrasion.  Right Ear: Hearing, tympanic membrane, external ear and ear canal normal.  Left Ear: Hearing, tympanic membrane, external ear and ear canal normal.  Nose: No sinus tenderness or nasal deformity.  Mouth/Throat: Uvula is midline and oropharynx is clear and moist.  Left-sided scalp hematoma  Eyes: EOM are normal. Pupils are equal, round, and reactive to light. Right eye exhibits no discharge. Left eye exhibits no discharge.  Neck: No tracheal deviation present.  No posterior midline tenderness, no pain with active range of motion  Cardiovascular: Normal rate, regular rhythm, normal heart sounds and intact distal pulses.   Respiratory: Effort normal and breath sounds normal. No stridor. No respiratory distress. She has no wheezes. She has no rales. She exhibits tenderness.  Significant left-sided chest  tenderness along her ribs, limited respiratory excursion due to pain  GI: Soft. She exhibits no distension. There is no tenderness. There is no rebound and no guarding.  Musculoskeletal: Normal range of motion. She exhibits no edema or tenderness.  Neurological: She is alert. She displays no atrophy and no tremor. No cranial nerve deficit. She exhibits normal muscle tone. She displays no seizure activity. GCS eye subscore is 4. GCS verbal subscore is 5. GCS motor subscore is 6.  Skin: Skin is warm.  Psychiatric:  anxious     Assessment/Plan Fall Left-sided rib fractures 5 - pain control and pulmonary toilet, chest x-ray in a.m. Right lung mass - likely a carcinoma. I reviewed her films with Dr. Halford Chessman from the pulmonary service. She will need to be set up for EBUS after discharge. Concussion Anxiety - Xanax when necessary Alcohol abuse - CIWA Chronic pancreatitis - home Reglan dose when necessary  Admit to trauma  Airrion Otting E 06/01/2015, 9:10 AM

## 2015-06-01 NOTE — ED Provider Notes (Signed)
CSN: [id number]     Arrival date & time 06/01/15  5176 History  By signing my name below, I, Sonum Patel, attest that this documentation has been prepared under the direction and in the presence of Merryl Hacker, MD. Electronically Signed: Sonum Patel, Education administrator. 06/01/2015. 2:40 AM.     Chief Complaint  Patient presents with  . Fall   The history is provided by the patient. No language interpreter was used.     HPI Comments: Tiffany Erickson is a 44 y.o. female who presents to the Emergency Department complaining of a fall that occurred PTA. Patient states she was attempting to turn off a smoke detector when she lost balance and fell over a 6-8 ft banister and onto stairs. She reports striking her head and back during the fall but denies LOC. She complains of constant left upper and mid back pain along with left rib pain and posterior head pain which she rates as 10/10. She reports having a few beers about 5-6 hours ago. She denies knee pain, elbow pain, CP, SOB.    Past Medical History  Diagnosis Date  . Pancreatitis   . Anxiety    History reviewed. No pertinent past surgical history. History reviewed. No pertinent family history. Social History  Substance Use Topics  . Smoking status: Current Every Day Smoker  . Smokeless tobacco: None  . Alcohol Use: Yes     Comment: drinks daily   OB History    No data available     Review of Systems  Respiratory: Positive for shortness of breath.   Cardiovascular: Negative for chest pain.  Musculoskeletal: Positive for myalgias, back pain and arthralgias.  Skin: Positive for wound.  Neurological: Positive for headaches. Negative for syncope.  All other systems reviewed and are negative.     Allergies  Sulfa antibiotics  Home Medications   Prior to Admission medications   Medication Sig Start Date End Date Taking? Authorizing Provider  ALPRAZolam Duanne Moron) 1 MG tablet Take 0.5-1 mg by mouth 3 (three) times daily as needed for anxiety.     Yes Historical Provider, MD  Melatonin 5 MG TABS Take 5 mg by mouth at bedtime as needed (sleep).   Yes Historical Provider, MD  metoCLOPramide (REGLAN) 10 MG tablet Take 1 tablet (10 mg total) by mouth every 8 (eight) hours as needed for nausea or vomiting. 02/03/15  Yes [name], MD  pantoprazole (PROTONIX) 20 MG tablet Take 20 mg by mouth daily.   Yes Historical Provider, MD  traMADol (ULTRAM) 50 MG tablet Take 1 tablet (50 mg total) by mouth every 12 (twelve) hours as needed for severe pain. 02/03/15  Yes Adeleke Claudine Mouton, MD   BP 120/85 mmHg  Pulse 106  Temp(Src) 98.3 F (36.8 C) (Oral)  Resp 21  SpO2 93%  LMP 05/11/2015 Physical Exam  Constitutional: She is oriented to person, place, and time.  ABCs intact  HENT:  Head: Normocephalic and atraumatic.  Mouth/Throat: Oropharynx is clear and moist.  Eyes: EOM are normal. Pupils are equal, round, and reactive to light.  Neck:  C-collar in place  Cardiovascular: Normal rate, regular rhythm and normal heart sounds.   Pulmonary/Chest: Effort normal. No respiratory distress.  Decreased air movement, mild splinting, tenderness to palpation over the left posterior chest wall with crepitus  Abdominal: Soft. Bowel sounds are normal.  Musculoskeletal:  Normal range of motion of the bilateral hips and knees  Neurological: She is alert and oriented to person, place, and time.  Skin: Skin is warm and dry.  Contusion left posterior chest wall, abrasions to bilateral knees  Psychiatric: She has a normal mood and affect.  Nursing note and vitals reviewed.   ED Course  Procedures (including critical care time)  DIAGNOSTIC STUDIES: Oxygen Saturation is 98% on RA, normal by my interpretation.    COORDINATION OF CARE: 2:48 AM Discussed treatment plan with pt at bedside and pt agreed to plan.   Labs Review Labs Reviewed  COMPREHENSIVE METABOLIC PANEL - Abnormal; Notable for the following:    Glucose, Bld 105 (*)    BUN <5 (*)    AST 46  (*)    Alkaline Phosphatase 129 (*)    All other components within normal limits  CBC - Abnormal; Notable for the following:    WBC 10.6 (*)    RBC 5.21 (*)    Hemoglobin 17.2 (*)    HCT 52.2 (*)    MCV 100.2 (*)    All other components within normal limits  ETHANOL - Abnormal; Notable for the following:    Alcohol, Ethyl (B) 214 (*)    All other components within normal limits  CDS SEROLOGY  PROTIME-INR  I-STAT BETA HCG BLOOD, ED ([facility], [facility], [facility] ONLY)  I-STAT BETA HCG BLOOD, ED ([facility], [facility], [facility] ONLY)  SAMPLE TO BLOOD BANK    Imaging Review Ct Head Wo Contrast  06/01/2015  CLINICAL DATA:  Status post fall 8 foot over banister. Unsure of loss of consciousness. Headaches and neck pain. EXAM: CT HEAD WITHOUT CONTRAST CT CERVICAL SPINE WITHOUT CONTRAST TECHNIQUE: Multidetector CT imaging of the head and cervical spine was performed following the standard protocol without intravenous contrast. Multiplanar CT image reconstructions of the cervical spine were also generated. COMPARISON:  December 23, 2012 head CT FINDINGS: CT HEAD FINDINGS There is no midline shift, hydrocephalus, or mass. No acute hemorrhage or acute transcortical infarct is identified. The bony calvarium is intact. The visualized sinuses are clear. There is left frontal parietal scalp hematoma. CT CERVICAL SPINE FINDINGS There is no acute fracture or dislocation. The prevertebral soft tissues are normal. There is no malalignment. There is a right apical lung mass measuring at least 4.8 cm, incompletely included. IMPRESSION: No focal acute intracranial abnormality identified. Left frontal/ parietal scalp hematoma. No acute fracture or dislocation of cervical spine. Right apical lung mass measuring at least 4.8 cm, incompletely included. Recommend further evaluation with chest CT. Electronically Signed   By: Abelardo Diesel M.D.   On: 06/01/2015 07:41   Ct Chest W Contrast  06/01/2015  CLINICAL DATA:  Left-sided chest and back pain after falling  8 feet. History of pancreatitis. EXAM: CT CHEST, ABDOMEN, AND PELVIS WITH CONTRAST TECHNIQUE: Multidetector CT imaging of the chest, abdomen and pelvis was performed following the standard protocol during bolus administration of intravenous contrast. CONTRAST:  164m OMNIPAQUE IOHEXOL 300 MG/ML  SOLN COMPARISON:  Abdominal pelvic CT 08/23/2013. Chest radiographs today. FINDINGS: CT CHEST Mediastinum/Nodes: The heart and great vessels appear unremarkable. There is no evidence of acute vascular injury or mediastinal hematoma. There is a 12 mm right paratracheal node on image number 17 and a 9 mm right hilar node on image 21. The thyroid gland, trachea and esophagus demonstrate no significant findings. The heart size is normal. There is no pericardial effusion. Lungs/Pleura: There is no pleural effusion or pneumothorax.There is a large right upper lobe mass, measuring 6.0 x 4.6 cm. This has slightly irregular margins, although is otherwise well circumscribed and is concerning  for malignancy rather than pulmonary contusion. Minimal atelectasis at the left lung base. No other suspicious pulmonary nodules. Musculoskeletal/Chest wall: There are several nondisplaced left-sided rib fractures posteriorly. No rib fractures are seen on the right. There is no evidence of acute spinal injury. There is a 2.3 cm water density lesion anteriorly in the left breast which appears cystic. CT ABDOMEN AND PELVIS FINDINGS Hepatobiliary: The liver is normal in density without focal abnormality. No evidence of gallstones, gallbladder wall thickening or biliary dilatation. Pancreas: Unremarkable. No pancreatic ductal dilatation or surrounding inflammatory changes. Spleen: Normal in size without focal abnormality. Adrenals/Urinary Tract: Both adrenal glands appear normal. The kidneys appear normal without evidence of urinary tract calculus, suspicious lesion or hydronephrosis. No bladder abnormalities are seen. Stomach/Bowel: No evidence of  bowel wall thickening, distention or surrounding inflammatory change. No evidence of bowel or mesenteric injury. The appendix appears normal. Vascular/Lymphatic: There are small lymph nodes within the porta hepatis and retroperitoneum, not pathologically enlarged. There is mild aortoiliac atherosclerosis. No evidence of retroperitoneal hematoma. Reproductive: Unremarkable. Other: No evidence of abdominal wall mass or hernia. Musculoskeletal: No acute or significant osseous findings. IMPRESSION: 1. There are several nondisplaced posterior left rib fractures. Mild associated left lower lobe atelectasis. No pneumothorax or significant pleural effusion. 2. Large right upper lobe mass is highly concerning for malignancy. Pulmonary contusion is considered less likely given the absence of associated right chest wall trauma and adjacent prominent right hilar and peritracheal lymph nodes. Close short-term follow-up recommended. 3. No acute or significant findings demonstrated within the abdomen or pelvis. Electronically Signed   By: Richardean Sale M.D.   On: 06/01/2015 07:43   Ct Cervical Spine Wo Contrast  06/01/2015  CLINICAL DATA:  Status post fall 8 foot over banister. Unsure of loss of consciousness. Headaches and neck pain. EXAM: CT HEAD WITHOUT CONTRAST CT CERVICAL SPINE WITHOUT CONTRAST TECHNIQUE: Multidetector CT imaging of the head and cervical spine was performed following the standard protocol without intravenous contrast. Multiplanar CT image reconstructions of the cervical spine were also generated. COMPARISON:  December 23, 2012 head CT FINDINGS: CT HEAD FINDINGS There is no midline shift, hydrocephalus, or mass. No acute hemorrhage or acute transcortical infarct is identified. The bony calvarium is intact. The visualized sinuses are clear. There is left frontal parietal scalp hematoma. CT CERVICAL SPINE FINDINGS There is no acute fracture or dislocation. The prevertebral soft tissues are normal. There is no  malalignment. There is a right apical lung mass measuring at least 4.8 cm, incompletely included. IMPRESSION: No focal acute intracranial abnormality identified. Left frontal/ parietal scalp hematoma. No acute fracture or dislocation of cervical spine. Right apical lung mass measuring at least 4.8 cm, incompletely included. Recommend further evaluation with chest CT. Electronically Signed   By: Abelardo Diesel M.D.   On: 06/01/2015 07:41   Ct Abdomen Pelvis W Contrast  06/01/2015  CLINICAL DATA:  Left-sided chest and back pain after falling 8 feet. History of pancreatitis. EXAM: CT CHEST, ABDOMEN, AND PELVIS WITH CONTRAST TECHNIQUE: Multidetector CT imaging of the chest, abdomen and pelvis was performed following the standard protocol during bolus administration of intravenous contrast. CONTRAST:  172m OMNIPAQUE IOHEXOL 300 MG/ML  SOLN COMPARISON:  Abdominal pelvic CT 08/23/2013. Chest radiographs today. FINDINGS: CT CHEST Mediastinum/Nodes: The heart and great vessels appear unremarkable. There is no evidence of acute vascular injury or mediastinal hematoma. There is a 12 mm right paratracheal node on image number 17 and a 9 mm right hilar node on image 21.  The thyroid gland, trachea and esophagus demonstrate no significant findings. The heart size is normal. There is no pericardial effusion. Lungs/Pleura: There is no pleural effusion or pneumothorax.There is a large right upper lobe mass, measuring 6.0 x 4.6 cm. This has slightly irregular margins, although is otherwise well circumscribed and is concerning for malignancy rather than pulmonary contusion. Minimal atelectasis at the left lung base. No other suspicious pulmonary nodules. Musculoskeletal/Chest wall: There are several nondisplaced left-sided rib fractures posteriorly. No rib fractures are seen on the right. There is no evidence of acute spinal injury. There is a 2.3 cm water density lesion anteriorly in the left breast which appears cystic. CT  ABDOMEN AND PELVIS FINDINGS Hepatobiliary: The liver is normal in density without focal abnormality. No evidence of gallstones, gallbladder wall thickening or biliary dilatation. Pancreas: Unremarkable. No pancreatic ductal dilatation or surrounding inflammatory changes. Spleen: Normal in size without focal abnormality. Adrenals/Urinary Tract: Both adrenal glands appear normal. The kidneys appear normal without evidence of urinary tract calculus, suspicious lesion or hydronephrosis. No bladder abnormalities are seen. Stomach/Bowel: No evidence of bowel wall thickening, distention or surrounding inflammatory change. No evidence of bowel or mesenteric injury. The appendix appears normal. Vascular/Lymphatic: There are small lymph nodes within the porta hepatis and retroperitoneum, not pathologically enlarged. There is mild aortoiliac atherosclerosis. No evidence of retroperitoneal hematoma. Reproductive: Unremarkable. Other: No evidence of abdominal wall mass or hernia. Musculoskeletal: No acute or significant osseous findings. IMPRESSION: 1. There are several nondisplaced posterior left rib fractures. Mild associated left lower lobe atelectasis. No pneumothorax or significant pleural effusion. 2. Large right upper lobe mass is highly concerning for malignancy. Pulmonary contusion is considered less likely given the absence of associated right chest wall trauma and adjacent prominent right hilar and peritracheal lymph nodes. Close short-term follow-up recommended. 3. No acute or significant findings demonstrated within the abdomen or pelvis. Electronically Signed   By: Richardean Sale M.D.   On: 06/01/2015 07:43   Dg Pelvis Portable  06/01/2015  CLINICAL DATA:  Status post fall over 8 foot bannister, with concern for pelvic injury. Initial encounter. EXAM: PORTABLE PELVIS 1-2 VIEWS COMPARISON:  CT of the abdomen and pelvis performed 08/23/2013 FINDINGS: There is no evidence of fracture or dislocation. Both femoral  heads are seated normally within their respective acetabula. No significant degenerative change is appreciated. The sacroiliac joints are unremarkable in appearance. The visualized bowel gas pattern is grossly unremarkable in appearance. IMPRESSION: No evidence of fracture or dislocation. Electronically Signed   By: Garald Balding M.D.   On: 06/01/2015 03:32   Dg Chest Portable 1 View  06/01/2015  CLINICAL DATA:  44 year old female with fall and multiple operations. EXAM: PORTABLE CHEST 1 VIEW COMPARISON:  None. FINDINGS: The heart size and mediastinal contours are within normal limits. Both lungs are clear. The visualized skeletal structures are unremarkable. IMPRESSION: No active disease. Electronically Signed   By: Anner Crete M.D.   On: 06/01/2015 03:32   I have personally reviewed and evaluated these images and lab results as part of my medical decision-making.   EKG Interpretation None      MDM   Final diagnoses:  Lung mass  Left rib fracture, closed, initial encounter    Patient presents after a fall. ABCs intact. Vital signs are notable for mild tachycardia. She has obvious crepitus left posterior chest with contusion. Sounds are present but diminished secondary to splinting. She does endorse alcohol use. Trauma gram obtained given mechanism of injury and alcohol use. Patient  was given pain medication. Full trauma gram obtained and notable only for several rib fractures at least 5 left posterior chest. No pneumothorax. Patient also incidentally noted to have a pulmonary mass concerning for malignancy. She is received multiple doses of pain medication in the ER and still reports 10 out of 10 pain. She is a smoker. I would be concerned about the patient developing pneumonia given for pain control. Trauma consulted. Patient was informed of her pulmonary mass and need for follow-up with biopsy.  I personally performed the services described in this documentation, which was scribed in  my presence. The recorded information has been reviewed and is accurate.   Merryl Hacker, MD 06/01/15 [phone]

## 2015-06-01 NOTE — ED Notes (Addendum)
Dr. Lavone Neri contacted to confirm orders.

## 2015-06-02 ENCOUNTER — Inpatient Hospital Stay (HOSPITAL_COMMUNITY): Payer: No Typology Code available for payment source

## 2015-06-02 DIAGNOSIS — W19XXXA Unspecified fall, initial encounter: Secondary | ICD-10-CM | POA: Diagnosis present

## 2015-06-02 LAB — BASIC METABOLIC PANEL
Anion gap: 8 (ref 5–15)
BUN: <5 mg/dL — ABNORMAL LOW (ref 6–20)
CHLORIDE: 97 mmol/L — AB (ref 101–111)
CO2: 28 mmol/L (ref 22–32)
CREATININE: 0.57 mg/dL (ref 0.44–1.00)
Calcium: 8.7 mg/dL — ABNORMAL LOW (ref 8.9–10.3)
GFR calc non Af Amer: >60 mL/min (ref >60)
Glucose, Bld: 104 mg/dL — ABNORMAL HIGH (ref 65–99)
POTASSIUM: 3.5 mmol/L (ref 3.5–5.1)
SODIUM: 133 mmol/L — AB (ref 135–145)

## 2015-06-02 LAB — CBC
HCT: 48.2 % — ABNORMAL HIGH (ref 36.0–46.0)
HEMOGLOBIN: 15.7 g/dL — AB (ref 12.0–15.0)
MCH: 33 pg (ref 26.0–34.0)
MCHC: 32.6 g/dL (ref 30.0–36.0)
MCV: 101.3 fL — ABNORMAL HIGH (ref 78.0–100.0)
Platelets: 257 10*3/uL (ref 150–400)
RBC: 4.76 MIL/uL (ref 3.87–5.11)
RDW: 13.4 % (ref 11.5–15.5)
WBC: 8.5 10*3/uL (ref 4.0–10.5)

## 2015-06-02 MED ORDER — HYDROMORPHONE HCL 1 MG/ML IJ SOLN
0.5000 mg | INTRAMUSCULAR | Status: DC | PRN
  Administered 2015-06-02 (×2): 0.5 mg via INTRAVENOUS
  Filled 2015-06-02 (×2): qty 1

## 2015-06-02 MED ORDER — DOCUSATE SODIUM 100 MG PO CAPS
100.0000 mg | ORAL_CAPSULE | Freq: Two times a day (BID) | ORAL | Status: DC
  Administered 2015-06-02 – 2015-06-04 (×5): 100 mg via ORAL
  Filled 2015-06-02 (×5): qty 1

## 2015-06-02 MED ORDER — NAPROXEN 250 MG PO TABS
500.0000 mg | ORAL_TABLET | Freq: Two times a day (BID) | ORAL | Status: DC
  Administered 2015-06-02 – 2015-06-03 (×3): 500 mg via ORAL
  Filled 2015-06-02 (×5): qty 2

## 2015-06-02 MED ORDER — POLYETHYLENE GLYCOL 3350 17 G PO PACK
17.0000 g | PACK | Freq: Every day | ORAL | Status: DC
  Administered 2015-06-02 – 2015-06-04 (×3): 17 g via ORAL
  Filled 2015-06-02 (×3): qty 1

## 2015-06-02 MED ORDER — OXYCODONE HCL 5 MG PO TABS
5.0000 mg | ORAL_TABLET | ORAL | Status: DC | PRN
  Administered 2015-06-02 – 2015-06-03 (×4): 15 mg via ORAL
  Filled 2015-06-02 (×4): qty 3

## 2015-06-02 MED ORDER — TRAMADOL HCL 50 MG PO TABS
100.0000 mg | ORAL_TABLET | Freq: Four times a day (QID) | ORAL | Status: DC
  Administered 2015-06-02 – 2015-06-04 (×9): 100 mg via ORAL
  Filled 2015-06-02 (×9): qty 2

## 2015-06-02 NOTE — Evaluation (Signed)
Physical Therapy Evaluation Patient Details Name: Tiffany Erickson MRN: [id number] DOB: 1970/10/25 Today's Date: 06/02/2015   History of Present Illness  Tiffany Erickson was up on a stepladder trying to turn off a smoke detector when she fell over the railing on the stairs approximately 8 feet. She landed on her right side. She is amnestic to parts of the event and may have had loss of consciousness. She called her mother and then was transported via EMS for evaluation at the emergency department. She underwent a thorough evaluation. She was found to have multiple left-sided rib fractures as well as an incidental finding of a large right pulmonary mass, consistent with a lung cancer. Additionally, she was intoxicated. I was asked to see her for admission to the trauma service. She complains of anxiety and left sided chest pain. She was unaware about the right lung mass. She did have a couple days of hemoptysis earlier this month but this resolved spontaneously. She attributed this to a cold. She drinks between 3 and many beers daily.  Clinical Impression   Pt admitted with above diagnosis. Pt currently with functional limitations due to the deficits listed below (see PT Problem List).  Pt will benefit from skilled PT to increase their independence and safety with mobility to allow discharge to the venue listed below.       Follow Up Recommendations Home health PT    Equipment Recommendations  None recommended by PT    Recommendations for Other Services       Precautions / Restrictions        Mobility  Bed Mobility Overal bed mobility: Needs Assistance Bed Mobility: Supine to Sit;Sit to Supine     Supine to sit: Supervision Sit to supine: Supervision   General bed mobility comments: Cues and suggestions for technique, getting in and out on R side; able to manage without physical assist; including stepping into bed with RLE and turning to sit to get back into bed  Transfers Overall transfer level:  Needs assistance Equipment used: None Transfers: Sit to/from Stand Sit to Stand: Supervision            Ambulation/Gait Ambulation/Gait assistance: Supervision Ambulation Distance (Feet): 60 Feet Assistive device: None Gait Pattern/deviations: Step-through pattern;Decreased step length - right;Decreased step length - left;Decreased stride length     General Gait Details: Slow moving, but able to manage without loss of balance  Stairs            Wheelchair Mobility    Modified Rankin (Stroke Patients Only)       Balance                                             Pertinent Vitals/Pain Pain Assessment: 0-10 Pain Score: 9  Pain Location: rib pain post amb Pain Descriptors / Indicators: Aching;Grimacing Pain Intervention(s): Limited activity within patient's tolerance;Monitored during session;Other (comment) (taught splinting rib fxs to be able to take deep breaths)    Home Living Family/patient expects to be discharged to:: Private residence Living Arrangements: Alone Available Help at Discharge: Family;Available PRN/intermittently Type of Home: House Home Access: Stairs to enter   Entrance Stairs-Number of Steps: 1-2 Home Layout: Two level;1/2 bath on main level;Bed/bath upstairs Home Equipment: None      Prior Function Level of Independence: Independent  Hand Dominance        Extremity/Trunk Assessment   Upper Extremity Assessment: LUE deficits/detail       LUE Deficits / Details: Hesitant to move LUE because fo anticipation of pain   Lower Extremity Assessment: Overall WFL for tasks assessed      Cervical / Trunk Assessment: Normal  Communication   Communication: No difficulties  Cognition Arousal/Alertness: Awake/alert Behavior During Therapy: WFL for tasks assessed/performed Overall Cognitive Status: Within Functional Limits for tasks assessed                      General Comments  General comments (skin integrity, edema, etc.): Ended session with pt working on her incentive spirometry    Exercises        Assessment/Plan    PT Assessment Patient needs continued PT services  PT Diagnosis Difficulty walking;Acute pain   PT Problem List Decreased activity tolerance;Decreased mobility;Decreased knowledge of precautions;Pain  PT Treatment Interventions DME instruction;Gait training;Stair training;Functional mobility training;Therapeutic activities;Therapeutic exercise;Patient/family education   PT Goals (Current goals can be found in the Care Plan section) Acute Rehab PT Goals Patient Stated Goal: Is wary of going home tomorrow PT Goal Formulation: With patient Time For Goal Achievement: 06/09/15 Potential to Achieve Goals: Good    Frequency Min 5X/week   Barriers to discharge Decreased caregiver support Must be independent ot go home    Co-evaluation               End of Session   Activity Tolerance: Patient tolerated treatment well Patient left: in bed;with call bell/phone within reach Nurse Communication: Mobility status;Patient requests pain meds         Time: 2751-7001 PT Time Calculation (min) (ACUTE ONLY): 26 min   Charges:   PT Evaluation $Initial PT Evaluation Tier I: 1 Procedure PT Treatments $Gait Training: 8-22 mins   PT G Codes:        Quin Hoop 06/02/2015, 4:26 PM  Roney [place], [place] Pager [phone] Office [phone]

## 2015-06-02 NOTE — Progress Notes (Signed)
Patient ID: Tiffany Erickson, female   DOB: 1970/09/04, 44 y.o.   MRN: [id number]   LOS: 1 day   Subjective: Pain worse after x-ray this morning.   Objective: Vital signs in last 24 hours: Temp:  [97.9 F (36.6 C)-99 F (37.2 C)] 98.7 F (37.1 C) (12/27 0900) Pulse Rate:  [94-108] 96 (12/27 0900) Resp:  [17-19] 17 (12/27 0900) BP: (105-142)/(69-90) 124/74 mmHg (12/27 0900) SpO2:  [92 %-98 %] 96 % (12/27 0900) Weight:  [69.4 kg (153 lb)] 69.4 kg (153 lb) (12/26 1415) Last BM Date: 05/31/15   IS: 1227m   Laboratory  CBC  Recent Labs  06/01/15 0430 06/02/15 0551  WBC 10.6* 8.5  HGB 17.2* 15.7*  HCT 52.2* 48.2*  PLT 272 257   BMET  Recent Labs  06/01/15 0430 06/02/15 0551  NA 142 133*  K 3.7 3.5  CL 105 97*  CO2 23 28  GLUCOSE 105* 104*  BUN <5* <5*  CREATININE 0.58 0.57  CALCIUM 9.0 8.7*    Radiology Results PORTABLE CHEST 1 VIEW  COMPARISON: CT scan of the chest 06/01/2015  FINDINGS: Masslike opacity in the right upper lobe is again noted. This is more completely demonstrated on the recent CT scan of the chest. The nondisplaced left-sided rib fractures are also better demonstrated on the prior CT scan. No evidence of pneumothorax or new pleural effusion. Cardiac and mediastinal contours remain within normal limits.  IMPRESSION: 1. No evidence of a pneumothorax or new pleural effusion. 2. Nondisplaced left-sided rib fractures better demonstrated on recent CT scan. 3. Right upper lobe pulmonary mass again noted.   Electronically Signed  By: HJacqulynn CadetM.D.  On: 06/02/2015 07:41   Physical Exam General appearance: alert and no distress Resp: clear to auscultation bilaterally Cardio: regular rate and rhythm GI: normal findings: bowel sounds normal and soft, non-tender   Assessment/Plan: Fall Multiple left rib fxs -- Pulmonary toilet Right pulmonary mass -- OP f/u with pulmonology EtOH use -- CIWA FEN -- Add NSAID, schedule  tramadol, increase OxyIR, SL IV, advance diet VTE -- SCD's, Lovenox Dispo -- Home once pain controlled, likely tomorrow    MLisette Abu PA-C Pager: [phone] Trauma PA Pager: [phone] 06/02/2015

## 2015-06-02 NOTE — Progress Notes (Signed)
OT Cancellation Note  Patient Details Name: Tiffany Erickson MRN: [id number] DOB: 01/13/1971   Cancelled Treatment:    Reason Eval/Treat Not Completed: Pain limiting ability to participate. Pt with 10/10 pain. Pt reporting, "I was doing ok with pain-we had a good system going-but that chest x-ray this morning threw everything off again." nursing notified. OT to reattempt as schedule permits.   Hortencia Pilar 06/02/2015, 11:12 AM

## 2015-06-03 MED ORDER — OXYCODONE HCL ER 10 MG PO T12A
10.0000 mg | EXTENDED_RELEASE_TABLET | Freq: Two times a day (BID) | ORAL | Status: DC
  Administered 2015-06-03 – 2015-06-04 (×3): 10 mg via ORAL
  Filled 2015-06-03 (×3): qty 1

## 2015-06-03 MED ORDER — ONDANSETRON HCL 4 MG/2ML IJ SOLN
4.0000 mg | INTRAMUSCULAR | Status: DC | PRN
  Administered 2015-06-03 – 2015-06-04 (×2): 4 mg via INTRAVENOUS
  Filled 2015-06-03 (×2): qty 2

## 2015-06-03 MED ORDER — ONDANSETRON HCL 4 MG PO TABS
4.0000 mg | ORAL_TABLET | ORAL | Status: DC | PRN
  Administered 2015-06-04: 4 mg via ORAL
  Filled 2015-06-03: qty 1

## 2015-06-03 MED ORDER — OXYCODONE HCL 5 MG PO TABS
10.0000 mg | ORAL_TABLET | ORAL | Status: DC | PRN
  Administered 2015-06-03 – 2015-06-04 (×3): 20 mg via ORAL
  Administered 2015-06-04: 10 mg via ORAL
  Administered 2015-06-04 (×2): 20 mg via ORAL
  Filled 2015-06-03 (×5): qty 4
  Filled 2015-06-03: qty 2

## 2015-06-03 NOTE — Clinical Social Work Note (Signed)
Clinical Social Work Assessment  Patient Details  Name: Tiffany Erickson MRN: [id number] Date of Birth: [birth year]  Date of referral:  06/03/15               Reason for consult:  Substance Use/ETOH Abuse                Permission sought to share information with:   (Pt very private and does not want anyone invovled in her care) Permission granted to share information::  No           Housing/Transportation Living arrangements for the past 2 months:  Single Family Home Source of Information:  Patient Patient Interpreter Needed:  None Criminal Activity/Legal Involvement Pertinent to Current Situation/Hospitalization:  No - Comment as needed Significant Relationships:  Friend, Parents (mother and a few friends - very limited support) Lives with:  Self Do you feel safe going back to the place where you live?  Yes Need for family participation in patient care:  No (Coment)  Care giving concerns:  Lives alone and does not want her mother or others to be in her home or involved. Difficulty moving due to pain from rib fractures.   Social Worker assessment / plan:  44 year old female admitted from home after falling from a stepladder while trying to change the batteries in her smoke detector.  She states that she fell over a bannister and feel 6-8 feet. She was alone at the time but was able to call for help with her cell phone.  Patient lives alone by choice and relates that she has a very estranged relationship with her mother due to many childhood issues.  Patient states that she entered therapy for 5 years to simply be able to tolerate being around her mother. She was raised by her grandparents who have both passed away; she lived in their home and survived on the inheritance that they left her until it was used up.  She now states that she will need to seek work agan. 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  Patient verbalizes that she has very few friends or supports and not  discuss with this CSW. States that she prefers solitude only seeks others when she absolutely "has to."  Discussed current ETOH use which she fully minimalizes and denies any concerns about this. She admits to a DWI in 2014 and lost her driver's license for a while.  She feels that she drinks "a few beers or drinks a day and this varies.  She adamantly refuses to accept any SA resources nor is she interested in entering any type of treatment program.  She states that she will allow her mother to take her home but plans to minimalize contact with her as she is "too hysterical and I have to try to calm her down."  She plans to utilize [employer] for transportation as needed once home until she can drive again. SBIRT completed but denies any any interest in resources, counseling or support and she does not feel she has a problem.  Employment status:  Unemployed Forensic scientist:  Managed Care PT Recommendations:  Home with [place] / Referral to community resources:   (Pt declines SA resources or any mental health resources)  Patient/Family's Response to care:  Patient verbalizes frustration with staff and states "they don't help me much" but then indicates that she not always ask for help due to her independent nature. She states that it  is hard to move due to her rib fractures and struggles with her limited mobility.  Patient/Family's Understanding of and Emotional Response to Diagnosis, Current Treatment, and Prognosis: Patient was very quiet and withdrawn during first part of today's visit- she was noted to be guarded with limited responses. As she talked however, she began to open up about her childhood and loss of parental support; she states that her mother was "not a mother to me" and her father was not involved in her life. Her support came from her grandparents and even though they have passed away- she remains stoic as she speaks of them. Patient did bring up that a recent finding of  a "lung tumor" has been very frightening to her and she states that she reached out to her mother to come to the hospital to talk about it but she delayed coming an brought her boyfriend who has many medical issues. She felt this cut off any opportunities for her to talk. As CSW was starting to get patient to open up about her emotional response to this current diagnosis- our discussion was interrupted by staff 3 x's. By the 3rd time- she turned away and indicated that she did not feel like talking anymore.  CSW offered to come back but she declined politely.  She hopes to be able to return home in a few days and denies any further needs or questions.  CSW will sign off.    Emotional Assessment Appearance:  Appears younger than stated age Attitude/Demeanor/Rapport:  Guarded, Suspicious (Difficult to engage at first ) Affect (typically observed):  Calm, Quiet, Stoic, Blunt, Sad Orientation:  Oriented to Self, Oriented to Place, Oriented to  Time, Oriented to Situation Alcohol / Substance use:  Alcohol Use (Current, chronic- long term) Psych involvement (Current and /or in the community):  No (Comment) (Was seeing a private counselor until several years ago)  Discharge Needs  Concerns to be addressed:  Patient refuses services, Care Coordination Readmission within the last 30 days:  No Current discharge risk:  Lack of support system, Lives alone, Substance Abuse (Limited support system  by patient's choice) Barriers to Discharge:  Continued Medical Work up, Active Substance Use (Will return home when stable. )   Williemae Area, LCSW 06/03/2015, 8:29 PM

## 2015-06-03 NOTE — Progress Notes (Signed)
Occupational Therapy Evaluation Patient Details Name: Tiffany Erickson MRN: [id number] DOB: 1971-02-01 Today's Date: 06/03/2015    History of Present Illness Tiffany Erickson was up on a stepladder trying to turn off a smoke detector when she fell over the railing on the stairs approximately 8 feet. She landed on her right side. She is amnestic to parts of the event and may have had loss of consciousness. She called her mother and then was transported via EMS for evaluation at the emergency department. She underwent a thorough evaluation. She was found to have multiple left-sided rib fractures as well as an incidental finding of a large right pulmonary mass, consistent with a lung cancer. Additionally, she was intoxicated. I was asked to see her for admission to the trauma service. She complains of anxiety and left sided chest pain. She was unaware about the right lung mass. She did have a couple days of hemoptysis earlier this month but this resolved spontaneously. She attributed this to a cold. She drinks between 3 and many beers daily.   Clinical Impression   Pt admitted with the above diagnoses and presents with below problem list. Pt will benefit from continued acute OT to address the below listed deficits and maximize independence with BADLs prior to d/c home. PTA pt was independent with ADLs. Pt is currently setup to min guard with ADLs. Pt reporting she will not have reliable assistance at home but that she will "be able to figure things out when I get home." ADL education provided and discussed strategies, AE, DME to facilitate independence and safety at home. OT to continue to follow acutely      Follow Up Recommendations  Supervision - Intermittent;Home health OT    Equipment Recommendations  3 in 1 bedside comode    Recommendations for Other Services       Precautions / Restrictions Restrictions Weight Bearing Restrictions: No      Mobility Bed Mobility Overal bed mobility: Needs  Assistance Bed Mobility: Supine to Sit;Rolling;Sit to Sidelying Rolling: Supervision   Supine to sit: Supervision   Sit to sidelying: Supervision General bed mobility comments: Discussed bed mobility techniques for comfort. Pt supine to EOB with HOB elevated at start of session. Therapist positioned Tiffany Erickson flat with pt completing logrolling technique to return to bed.   Transfers Overall transfer level: Needs assistance Equipment used: None Transfers: Sit to/from Stand Sit to Stand: Supervision         General transfer comment: from EOB, comfort height toilet    Balance Overall balance assessment: Needs assistance Sitting-balance support: No upper extremity supported;Feet supported Sitting balance-Tiffany Erickson Scale: Good Sitting balance - Comments: donned socks in sitting position by bringing foot to rest on opposite knee.    Standing balance support: No upper extremity supported Standing balance-Tiffany Erickson Scale: Good                              ADL Overall ADL's : Needs assistance/impaired Eating/Feeding: Set up;Sitting   Grooming: Standing;Set up;Cueing for compensatory techniques   Upper Body Bathing: Set up;Sitting   Lower Body Bathing: Min guard;Sit to/from stand   Upper Body Dressing : Set up;Sitting   Lower Body Dressing: Min guard;Sit to/from stand Lower Body Dressing Details (indicate cue type and reason): donned socks sitting EOB. instructed and able to bring feet up to don socks.  Toilet Transfer: Supervision/safety;Ambulation;Comfort height toilet;Grab bars Toilet Transfer Details (indicate cue type and reason): pt noted to use  grab bars on right side to assist with transfer [place] and Hygiene: Set up;Sitting/lateral lean       Functional mobility during ADLs: Supervision/safety;Min guard General ADL Comments: Pt completed household distance functional mobility at supervision to min guards level. Pt noted to veer left and right  at times and seek external support, decreased speed. Pt reports she has plantar fasciitis and balance is a little off at baseline. Discussed strategies, AE and recommended DME for completing ADLs at home. Pt reports she does not have reliable assistance at home. Discussed timing of tasks such as bathing when she knows someone will be around.      Vision     Perception     Praxis      Pertinent Vitals/Pain Pain Assessment: 0-10 Pain Score: 9  Pain Location: rib pain Pain Descriptors / Indicators: Aching;Grimacing;Operative site guarding Pain Intervention(s): Limited activity within patient's tolerance;Monitored during session;Premedicated before session;Repositioned     Hand Dominance     Extremity/Trunk Assessment Upper Extremity Assessment Upper Extremity Assessment: Overall WFL for tasks assessed;LUE deficits/detail LUE Deficits / Details: Pt holding LUE in elbow flexion for comfort during ambulation.    Lower Extremity Assessment Lower Extremity Assessment: Defer to PT evaluation   Cervical / Trunk Assessment Cervical / Trunk Assessment: Normal   Communication Communication Communication: No difficulties   Cognition Arousal/Alertness: Awake/alert Behavior During Therapy: WFL for tasks assessed/performed Overall Cognitive Status: Within Functional Limits for tasks assessed                     General Comments       Exercises       Shoulder Instructions      Home Living Family/patient expects to be discharged to:: Private residence Living Arrangements: Alone Available Help at Discharge: Family;Available PRN/intermittently Type of Home: House Home Access: Stairs to enter [employer] of Steps: 1-2   Home Layout: Two level;1/2 bath on main level;Bed/bath upstairs Alternate Level Stairs-Number of Steps: 12   Bathroom Shower/Tub: Teacher, early years/pre: Standard     Home Equipment: None          Prior  Functioning/Environment Level of Independence: Independent             OT Diagnosis: Acute pain   OT Problem List: Impaired balance (sitting and/or standing);Decreased knowledge of use of DME or AE;Decreased knowledge of precautions;Pain   OT Treatment/Interventions: Self-care/ADL training;DME and/or AE instruction;Therapeutic activities;Patient/family education;Balance training    OT Goals(Current goals can be found in the care plan section) Acute Rehab OT Goals Patient Stated Goal: not stated OT Goal Formulation: With patient Time For Goal Achievement: 06/10/15 Potential to Achieve Goals: Good ADL Goals Pt Will Perform Grooming: with modified independence;sitting;standing Pt Will Perform Upper Body Bathing: with modified independence;sitting Pt Will Perform Lower Body Bathing: with modified independence;with adaptive equipment;sit to/from stand Pt Will Perform Upper Body Dressing: with modified independence;sitting Pt Will Perform Lower Body Dressing: with modified independence;with adaptive equipment;sitting/lateral leans;sit to/from stand Pt Will Perform Tub/Shower Transfer: Tub transfer;with modified independence;ambulating;3 in 1 Additional ADL Goal #1: Pt will complete logrolling at mod I level to prepare for OOB ADLs.   OT Frequency: Min 2X/week   Barriers to D/C: Decreased caregiver support          Co-evaluation              End of Session    Activity Tolerance: Patient tolerated treatment well Patient left: in bed;with call bell/phone  within reach   Time: 1204-1224 OT Time Calculation (min): 20 min Charges:  OT General Charges $OT Visit: 1 Procedure OT Evaluation $Initial OT Evaluation Tier I: 1 Procedure G-Codes:    Hortencia Pilar 06-30-15, 12:44 PM

## 2015-06-03 NOTE — Progress Notes (Signed)
Patient ID: EVAH RASHID, female   DOB: [birth year], 44 y.o.   MRN: [id number]   LOS: 2 days   Subjective: Up and down with respect to pain control. Did good with PT yesterday.   Objective: Vital signs in last 24 hours: Temp:  [97.9 F (36.6 C)-98.7 F (37.1 C)] 97.9 F (36.6 C) (12/28 0643) Pulse Rate:  [94-108] 100 (12/28 0643) Resp:  [17-18] 18 (12/28 0643) BP: (121-140)/(65-84) 121/65 mmHg (12/28 0643) SpO2:  [95 %-98 %] 98 % (12/28 0643) Last BM Date: 05/31/15   IS: 1540m (+2557m   Physical Exam General appearance: alert and no distress Resp: clear to auscultation bilaterally Cardio: regular rate and rhythm GI: normal findings: bowel sounds normal and soft, non-tender   Assessment/Plan: Fall Multiple left rib fxs -- Pulmonary toilet Right pulmonary mass -- OP f/u with pulmonology EtOH use -- CIWA FEN -- Increase OxyIR, add OxyContin VTE -- SCD's, Lovenox Dispo -- Home once pain controlled, possibly this afternoon    MiLisette AbuPA-C Pager: [phone] 1631eneral Trauma PA Pager: [phone]/28/2016

## 2015-06-03 NOTE — Progress Notes (Signed)
Physical Therapy Treatment Patient Details Name: Tiffany Erickson MRN: [id number] DOB: 12/04/1970 Today's Date: 06/03/2015    History of Present Illness Tiffany Erickson was up on a stepladder trying to turn off a smoke detector when she fell over the railing on the stairs approximately 8 feet. She landed on her right side. She is amnestic to parts of the event and may have had loss of consciousness. She called her mother and then was transported via EMS for evaluation at the emergency department. She underwent a thorough evaluation. She was found to have multiple left-sided rib fractures as well as an incidental finding of a large right pulmonary mass, consistent with a lung cancer. Additionally, she was intoxicated. I was asked to see her for admission to the trauma service. She complains of anxiety and left sided chest pain. She was unaware about the right lung mass. She did have a couple days of hemoptysis earlier this month but this resolved spontaneously. She attributed this to a cold. She drinks between 3 and many beers daily.    PT Comments    Overall moving well, but painful and not confident; Discussed allowing her mother to help her a bit more around the house at dc; Tiffany Erickson politely declined stairs this session due to pain; she tells me that she plans to sty on her first floor of her home until she feels better   Follow Up Recommendations  Home health PT     Equipment Recommendations  None recommended by PT    Recommendations for Other Services       Precautions / Restrictions      Mobility  Bed Mobility Overal bed mobility: Needs Assistance Bed Mobility: Supine to Sit;Sit to Supine Rolling: Supervision   Supine to sit: Supervision Sit to supine: Supervision   General bed mobility comments: Cues and suggestions for technique, getting in and out on R side; able to manage without physical assist; including stepping into bed with RLE and turning to sit to get back into  bed  Transfers Overall transfer level: Needs assistance Equipment used: None Transfers: Sit to/from Stand Sit to Stand: Supervision         General transfer comment: Slow, but steady  Ambulation/Gait Ambulation/Gait assistance: Supervision Ambulation Distance (Feet): 80 Feet Assistive device: None Gait Pattern/deviations: Step-through pattern     General Gait Details: Slow moving, but able to manage without loss of balance   Stairs            Wheelchair Mobility    Modified Rankin (Stroke Patients Only)       Balance     Sitting balance-Leahy Scale: Good       Standing balance-Leahy Scale: Good                      Cognition Arousal/Alertness: Awake/alert Behavior During Therapy: WFL for tasks assessed/performed Overall Cognitive Status: Within Functional Limits for tasks assessed                      Exercises      General Comments General comments (skin integrity, edema, etc.): Discussed improtance of deep breathing       Pertinent Vitals/Pain Pain Assessment: 0-10 Pain Score: 8  Pain Location: rib pain Pain Descriptors / Indicators: Aching Pain Intervention(s): Limited activity within patient's tolerance (educated in splinting)    Home Living  Prior Function            PT Goals (current goals can now be found in the care plan section) Acute Rehab PT Goals Patient Stated Goal: Is wary of going home PT Goal Formulation: With patient Time For Goal Achievement: 06/09/15 Potential to Achieve Goals: Good Progress towards PT goals: Progressing toward goals    Frequency  Min 5X/week    PT Plan Current plan remains appropriate    Co-evaluation             End of Session   Activity Tolerance: Patient tolerated treatment well Patient left: in bed;with call bell/phone within reach     Time: 2505-3976 PT Time Calculation (min) (ACUTE ONLY): 19 min  Charges:  $Gait Training: 8-22  mins                    G Codes:      Quin Hoop 06/03/2015, 4:05 PM  Roney Marion, [place] Pager [phone] Office [phone]

## 2015-06-03 NOTE — Care Management Note (Signed)
Case Management Note  Patient Details  Name: Tiffany Erickson MRN: [id number] Date of Birth: [birth year]  Subjective/Objective:   Pt admitted on 06/01/15 s/p fall with multiple rib fractures.  PTA, pt independent, lives alone.                   Action/Plan: Will follow for discharge planning as pt progresses.  PT/OT recommending HH follow up.    Expected Discharge Date:                  Expected Discharge Plan:  [place]  In-House Referral:     Discharge planning Services  CM Consult  Post Acute Care Choice:    Choice offered to:     DME Arranged:    DME Agency:     HH Arranged:    [place] Agency:     Status of Service:  In process, will continue to follow  Medicare Important Message Given:    Date Medicare IM Given:    Medicare IM give by:    Date Additional Medicare IM Given:    Additional Medicare Important Message give by:     If discussed at [place] of Stay Meetings, dates discussed:    Additional Comments:  Reinaldo Raddle, RN, BSN  Trauma/Neuro ICU Case Manager [phone]

## 2015-06-04 MED ORDER — OXYCODONE HCL ER 10 MG PO T12A
10.0000 mg | EXTENDED_RELEASE_TABLET | Freq: Once | ORAL | Status: AC
  Administered 2015-06-04: 10 mg via ORAL
  Filled 2015-06-04: qty 1

## 2015-06-04 MED ORDER — OXYCODONE HCL ER 10 MG PO T12A: 20.0000 mg | EXTENDED_RELEASE_TABLET | Freq: Two times a day (BID) | ORAL | Status: DC

## 2015-06-04 MED ORDER — NAPROXEN 500 MG PO TABS: 500.0000 mg | ORAL_TABLET | Freq: Two times a day (BID) | ORAL | Status: DC

## 2015-06-04 MED ORDER — TRAMADOL HCL 50 MG PO TABS: 100.0000 mg | ORAL_TABLET | Freq: Four times a day (QID) | ORAL | Status: DC

## 2015-06-04 MED ORDER — OXYCODONE HCL ER 10 MG PO T12A: EXTENDED_RELEASE_TABLET | ORAL | Status: DC

## 2015-06-04 MED ORDER — OXYCODONE-ACETAMINOPHEN 10-325 MG PO TABS: 1.0000 | ORAL_TABLET | ORAL | Status: DC | PRN

## 2015-06-04 NOTE — Discharge Instructions (Signed)
No driving while taking oxycodone.

## 2015-06-04 NOTE — Progress Notes (Signed)
PT Cancellation Note  Patient Details Name: KUSHI KUN MRN: [id number] DOB: [birth year]   Cancelled Treatment:    Reason Eval/Treat Not Completed: Other (comment)   Politely declining amb at this time;   Roney Marion, [place] Pager [phone] Office [phone]'    Roney Marion [facility] 06/04/2015, 2:58 PM

## 2015-06-04 NOTE — Progress Notes (Signed)
Patient ID: Tiffany Erickson, female   DOB: [birth year], 44 y.o.   MRN: [id number]   LOS: 3 days   Subjective: No new c/o. Pain control is a little better.   Objective: Vital signs in last 24 hours: Temp:  [97.7 F (36.5 C)-98.4 F (36.9 C)] 98.4 F (36.9 C) (12/29 7544) Pulse Rate:  [90-99] 93 (12/29 0613) Resp:  [18-19] 19 (12/29 0613) BP: (123-136)/(70-85) 130/70 mmHg (12/29 0613) SpO2:  [96 %-100 %] 97 % (12/29 9201) Last BM Date: 05/31/15   IS: 1516m (=)   Physical Exam General appearance: alert and no distress Resp: clear to auscultation bilaterally Cardio: regular rate and rhythm GI: normal findings: bowel sounds normal and soft, non-tender   Assessment/Plan: Fall Multiple left rib fxs -- Pulmonary toilet Right pulmonary mass -- OP f/u with pulmonology EtOH use -- CIWA Dispo -- D/C home    MLisette Abu PA-C Pager: [phone] Trauma PA Pager: [phone] 06/04/2015

## 2015-06-04 NOTE — Discharge Planning (Signed)
Valuables returned to patient. AVS and rx's given to patient who verbalizes understanding. Dc'd per w/c to private car home with all personal belongings accompanied by her Mom at 1715.

## 2015-06-04 NOTE — Discharge Summary (Signed)
Physician Discharge Summary  Patient ID: Tiffany Erickson MRN: [id number] DOB/AGE: Aug 28, 1970 44 y.o.  Admit date: 06/01/2015 Discharge date: 06/04/2015  Discharge Diagnoses Patient Active Problem List   Diagnosis Date Noted  . Fall 06/02/2015  . Pulmonary mass 06/02/2015  . Multiple rib fractures 06/01/2015    Consultants None   Procedures None   HPI: Ignacia was up on a stepladder trying to turn off a smoke detector when she fell over the railing on the stairs approximately 8 feet. She landed on her right side. She was amnestic to parts of the event and may have had a loss of consciousness. She called her mother and then was transported via EMS for evaluation at the emergency department. She underwent a thorough evaluation. She was found to have multiple left-sided rib fractures as well as an incidental finding of a large right pulmonary mass consistent with a lung cancer. Additionally, she was intoxicated. She did have a couple days of hemoptysis earlier this month but this resolved spontaneously. She attributed this to a cold. She drinks between 3 and many beers daily.   Hospital Course: The patient did not suffer any respiratory compromise from her rib fractures. Pulmonology was consulted and they said they would see her as an outpatient. It took multiple medications and dosage modifications to get her pain under control. Once it was adequately treated she was discharged home in good condition.     Medication List    TAKE these medications        ALPRAZolam 1 MG tablet  Commonly known as:  XANAX  Take 0.5-1 mg by mouth 3 (three) times daily as needed for anxiety.     Melatonin 5 MG Tabs  Take 5 mg by mouth at bedtime as needed (sleep).     metoCLOPramide 10 MG tablet  Commonly known as:  REGLAN  Take 1 tablet (10 mg total) by mouth every 8 (eight) hours as needed for nausea or vomiting.     naproxen 500 MG tablet  Commonly known as:  NAPROSYN  Take 1 tablet (500 mg total)  by mouth 2 (two) times daily with a meal.     oxyCODONE 10 mg 12 hr tablet  Commonly known as:  OXYCONTIN  '20mg'$  q12 x7d then '10mg'$  q12h x7d then stop     oxyCODONE-acetaminophen 10-325 MG tablet  Commonly known as:  PERCOCET  Take 1-2 tablets by mouth every 4 (four) hours as needed for pain.     pantoprazole 20 MG tablet  Commonly known as:  PROTONIX  Take 20 mg by mouth daily.     traMADol 50 MG tablet  Commonly known as:  ULTRAM  Take 2 tablets (100 mg total) by mouth 4 (four) times daily.            Follow-up Information    Follow up with PARRETT,TAMMY, NP On 06/22/2015.   Specialty:  Pulmonary Disease   Why:  9:00AM   Contact information:   [address] [place] [address] [phone]       Call [place].   Why:  As needed   Contact information:   [address] [id number] [place]       Signed: Lisette Abu, PA-C Pager: [phone] General Trauma PA Pager: [phone] 06/04/2015, 10:21 AM

## 2015-06-04 NOTE — Care Management Note (Signed)
Case Management Note  Patient Details  Name: SHINA WASS MRN: [id number] Date of Birth: September 30, 1970  Subjective/Objective:    Pt for dc later today.  Pt lives home alone; has little support from family or friends.  PT recommending [place] follow up.                Action/Plan: Met with pt to discuss [place] set up; pt politely declines [place] services at this time, as she cannot afford copays.  She states she does not think she needs HH at this time.  She hopes that her friend will assist her with getting groceries at dc.    Expected Discharge Date:   06/04/15               Expected Discharge Plan:  [place]  In-House Referral:     Discharge planning Services  CM Consult  Post Acute Care Choice:  Home Health Choice offered to:     DME Arranged:    DME Agency:     HH Arranged:  Patient Refused [place] Agency:     Status of Service:  Completed, signed off  Medicare Important Message Given:    Date Medicare IM Given:    Medicare IM give by:    Date Additional Medicare IM Given:    Additional Medicare Important Message give by:     If discussed at [place] of Stay Meetings, dates discussed:    Additional Comments:  Reinaldo Raddle, RN, BSN  Trauma/Neuro ICU Case Manager [phone]

## 2015-06-22 ENCOUNTER — Inpatient Hospital Stay: Payer: PRIVATE HEALTH INSURANCE | Admitting: Adult Health

## 2015-06-30 ENCOUNTER — Encounter: Payer: Self-pay | Admitting: Internal Medicine

## 2015-06-30 ENCOUNTER — Ambulatory Visit (INDEPENDENT_AMBULATORY_CARE_PROVIDER_SITE_OTHER): Payer: BLUE CROSS/BLUE SHIELD | Admitting: Internal Medicine

## 2015-06-30 ENCOUNTER — Other Ambulatory Visit
Admission: RE | Admit: 2015-06-30 | Discharge: 2015-06-30 | Disposition: A | Discharge status: Home / Self Care | Payer: BLUE CROSS/BLUE SHIELD | Source: Ambulatory Visit | Attending: Internal Medicine | Admitting: Internal Medicine

## 2015-06-30 VITALS — BP 128/72 | HR 116 | Wt 135.6 lb

## 2015-06-30 DIAGNOSIS — R0781 Pleurodynia: Secondary | ICD-10-CM

## 2015-06-30 DIAGNOSIS — F1910 Other psychoactive substance abuse, uncomplicated: Secondary | ICD-10-CM | POA: Diagnosis not present

## 2015-06-30 DIAGNOSIS — R918 Other nonspecific abnormal finding of lung field: Secondary | ICD-10-CM | POA: Diagnosis present

## 2015-06-30 DIAGNOSIS — Z720 Tobacco use: Secondary | ICD-10-CM

## 2015-06-30 NOTE — Progress Notes (Signed)
[facility] Consultation      Assessment and Plan:  Lung mass. -Right upper lobe lung mass, with right hilar lymphadenopathy, we'll plan bronchoscopy. Lung mass appears to be accessible via bronchoscopy.  Mediastinal lymphadenopathy. -We'll consider EBUS bronchoscopy, would like to get her pleuritic chest pain under better control before-hand  Multiple left-sided rib fractures. -Status post recent fall/trauma, now with severe pleuritic chest pain.  Alcohol abuse. -She is not currently drinking to excess. -Discussed the importance of cessation.  Nicotine abuse. -Discussed tobacco cessation. I asked her to stop smoking as this increases the risk of palpitations with bronchoscopy. -Given her pleuritic chest pain. We will hold off on scheduling a pulmonary function test at this time  Date: 06/30/2015  MRN# [id number] Tiffany Erickson 1971/05/22  Referring Physician:   [name] is a 45 y.o. old female seen in consultation for chief complaint of:    Chief Complaint  Patient presents with  . pulmonary consult    pt. states she has prod. cough yellowish in color. denies SOB, wheezing or chest pain/tightness.    HPI:  The patient is a 45 year old female. She had a fall off of a ladder on 06/01/2015, she apparently had a likely loss of consciousness at that time, she had been drinking at that time.She was found to have multiple left-sided rib fractures and incidentally found to have a large right pulmonary mass.  She got 2 new cats about a month ago, she sleeps in her recliner because of left chest pain. Today she notes that she continues to have severe pain and is grabbing her left side. She feels that the pain is radiating to the right side.  She is not working currently.   She notes about a month ago, she got a cold, she coughed up a bit of blood, it was bright red, it lasted only one day. She denied trouble breathing at that time or now. She has lost about 10 lbs  in about 3 weeks. Her grandparents had colon and pancreatic ca. No Lung cancer.   She has not drank since the fall other than one occasion. 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   She was discharged with naproxen and ultram which made her nauseated, so she stopped them. She has anxiety and take xanax '1mg'$  bid.    Review of images and report, CT chest 06/01/2015: Large right upper lobe mass with right paratracheal lymphadenopathy.  PMHX:   Past Medical History  Diagnosis Date  . Chronic pancreatitis ([place])   . Multiple fractures of ribs of right side 06/01/2015    "5" S/P fall   . Concussion 06/01/2015    S/P fall  . Lung mass     right side noted 06/01/2015  . Anxiety   . GERD (gastroesophageal reflux disease)   . Frequent UTI     "sometimes" (06/01/2015)   Surgical Hx:  Past Surgical History  Procedure Laterality Date  . Tonsillectomy    . Eye muscle surgery Left ~ 1979  . Myringotomy with tube placement Bilateral    Family Hx:  No family history on file. Social Hx:   Social History  Substance Use Topics  . Smoking status: Current Every Day Smoker -- 0.50 packs/day for 28 years    Types: Cigarettes  . Smokeless tobacco: Never Used  . Alcohol Use: 8.4 oz/week    12 Cans of beer, 2 Glasses of wine per week  Comment: 06/01/2015 "drink probably 4 times/wk; either ;4 beers or 2 glasses of wine when I do drink"   Medication:   Current Outpatient Rx  Name  Route  Sig  Dispense  Refill  . ALPRAZolam (XANAX) 1 MG tablet   Oral   Take 0.5-1 mg by mouth 3 (three) times daily as needed for anxiety.          . Melatonin 5 MG TABS   Oral   Take 5 mg by mouth at bedtime as needed (sleep).         . metoCLOPramide (REGLAN) 10 MG tablet   Oral   Take 1 tablet (10 mg total) by mouth every 8 (eight) hours as needed for nausea or vomiting.   10 tablet   0   . naproxen (NAPROSYN) 500 MG tablet   Oral   Take 1 tablet (500 mg total)  by mouth 2 (two) times daily with a meal.   60 tablet   1   . oxyCODONE (OXYCONTIN) 10 mg 12 hr tablet      '20mg'$  q12 x7d then '10mg'$  q12h x7d then stop   42 tablet   0   . oxyCODONE-acetaminophen (PERCOCET) 10-325 MG tablet   Oral   Take 1-2 tablets by mouth every 4 (four) hours as needed for pain.   60 tablet   0   . pantoprazole (PROTONIX) 20 MG tablet   Oral   Take 20 mg by mouth daily.         . traMADol (ULTRAM) 50 MG tablet   Oral   Take 2 tablets (100 mg total) by mouth 4 (four) times daily.   200 tablet   0       Allergies:  Sulfa antibiotics  Review of Systems: Gen:  Denies  fever, sweats, chills HEENT: Denies blurred vision, double vision. Cvc:  No dizziness, chest pain. Resp:   Denies cough or sputum porduction, shortness of breath  Gi: Denies swallowing difficulty, stomach pain. Gu:  Denies bladder incontinence, burning urine Ext:   No Joint pain, stiffness. Skin: No skin rash,  hives Endoc:  No polyuria, polydipsia. Psych: No depression, insomnia. Other:  All other systems were reviewed with the patient and were negative other that what is mentioned in the HPI.   Physical Examination:   VS: BP 128/72 mmHg  Pulse 116  Wt 135 lb 9.6 oz (61.508 kg)  SpO2 97%  LMP 05/11/2015  General Appearance: No distress  Neuro:without focal findings,  speech normal,  HEENT: PERRLA, EOM intact.   Pulmonary: normal breath sounds, No wheezing.  CardiovascularNormal S1,S2.  No m/r/g.   Abdomen: Benign, Soft, non-tender. Renal:  No costovertebral tenderness  GU:  No performed at this time. Endoc: No evident thyromegaly, no signs of acromegaly. Skin:   warm, no rashes, no ecchymosis  Extremities: normal, no cyanosis, clubbing.  Other findings:    LABORATORY PANEL:   CBC No results for input(s): WBC, HGB, HCT, PLT in the last 168  hours. ------------------------------------------------------------------------------------------------------------------  Chemistries  No results for input(s): NA, K, CL, CO2, GLUCOSE, BUN, CREATININE, CALCIUM, MG, AST, ALT, ALKPHOS, BILITOT in the last 168 hours.  Invalid input(s): GFRCGP ------------------------------------------------------------------------------------------------------------------  Cardiac Enzymes No results for input(s): TROPONINI in the last 168 hours. ------------------------------------------------------------  RADIOLOGY:  No results found.     Thank  you for the consultation and for allowing [facility] to assist in the care of your patient. Our recommendations are noted above.  Please  contact us if we can be of further service.   Marda Stalker, MD.  Board Certified in Internal Medicine, Pulmonary Medicine, [place], and Sleep Medicine.  [facility]   Patricia Pesa, M.D.  Vilinda Boehringer, M.D.  Merton Border, M.D

## 2015-06-30 NOTE — Patient Instructions (Addendum)
--  ACE level.  --Will set up EBUS-bronchoscopy with Fluoroscopy.  --You should stop smoking, continued smoking increases risks of complication with bronchoscopy.

## 2015-07-01 LAB — ANGIOTENSIN CONVERTING ENZYME: ANGIOTENSIN-CONVERTING ENZYME: 52 U/L (ref 14–82)

## 2015-07-06 ENCOUNTER — Other Ambulatory Visit: Payer: BLUE CROSS/BLUE SHIELD

## 2015-07-06 ENCOUNTER — Encounter: Payer: Self-pay | Admitting: *Deleted

## 2015-07-06 ENCOUNTER — Inpatient Hospital Stay: Admission: RE | Admit: 2015-07-06 | Payer: BLUE CROSS/BLUE SHIELD | Source: Ambulatory Visit

## 2015-07-06 NOTE — Pre-Procedure Instructions (Signed)
CALLED PT TO DO HER PHONE INTERVIEW AND PT STATES THAT SHE DOES NOT LIKE TO THE WAY THAT SHE FEELS-PT STATES SHE FEELS LIKE SHE IS GOING THRU PAIN MED WITHDRAWAL AND THAT HER XANAX IS NOT HELPING HER-PT STATES SHE DOES NOT LIKE WHAT HER HEAD IS TELLING

## 2015-07-06 NOTE — Patient Instructions (Signed)
  Your procedure is scheduled on: 07-13-15  Report to [facility] To find out your arrival time please call [phone] between 1PM - 3PM on 07-10-15  Remember: Instructions that are not followed completely may result in serious medical risk, up to and including death, or upon the discretion of your surgeon and anesthesiologist your surgery may need to be rescheduled.    _X___ 1. Do not eat food or drink liquids after midnight. No gum chewing or hard candies.     _X___ 2. No Alcohol for 24 hours before or after surgery.   ____ 3. Bring all medications with you on the day of surgery if instructed.    ____ 4. Notify your doctor if there is any change in your medical condition     (cold, fever, infections).     Do not wear jewelry, make-up, hairpins, clips or nail polish.  Do not wear lotions, powders, or perfumes. You may wear deodorant.  Do not shave 48 hours prior to surgery. Men may shave face and neck.  Do not bring valuables to the hospital.    [facility] is not responsible for any belongings or valuables.               Contacts, dentures or bridgework may not be worn into surgery.  Leave your suitcase in the car. After surgery it may be brought to your room.  For patients admitted to the hospital, discharge time is determined by your  treatment team.   Patients discharged the day of surgery will not be allowed to drive home.   Please read over the following fact sheets that you were given:      _X___ Take these medicines the morning of surgery with A SIP OF WATER:    1. XANAX  2. PROTONIX   3. TAKE EXTRA DOSE PROTONIX Sunday NIGHT  4.  5.  6.  ____ Fleet Enema (as directed)   ____ Use CHG Soap as directed  ____ Use inhalers on the day of surgery  ____ Stop metformin 2 days prior to surgery    ____ Take 1/2 of usual insulin dose the night before surgery and none on the morning of surgery.   ____ Stop Coumadin/Plavix/aspirin-N/A  _X___ Stop  Anti-inflammatories-STOP NAPROXEN NOW-TRAMADOL/TYLENOL OK TO CONTINUE    _X___ Stop supplements until after surgery-STOP MELATONIN NOW  ____ Bring C-Pap to the hospital.

## 2015-07-06 NOTE — Pre-Procedure Instructions (Signed)
CONTINUATION OF EARLIER NOTE-WHEN ASKED SUICIDE QUESTIONS PT STATES NO TO THOSE QUESTIONS ADDRESSED IN EPIC-PT WAS INSTRUCTED TO CALL DR KJ WHO HAD ORDERED HER OXYCODONE WHEN GETTING DISCHARGED FROM HOSPITAL AND WHO HAS BEEN MANAGING HER XANAX-PT STATES SHE FLUSHED OXYCODONE DOWN THE TOILET AND FEELS LIKE SHES IN WITHDRWAL BECAUSE HER LEGS WONT STAY STILL AND SHE FEELS REALLY NERVOUS-PT STATES SHE HAS BEEN LAYING ON THE COUCH FOR WEEKS NOW DUE TO HER RIB FX AND THAT HER XANAX IS NOT CALMING HER DOWN- THE PT NEVER TOLD ME WHAT THOUGHTS SHE WAS HAVING BUT I DID TELL HER THAT IF DR Cherylann Banas WAS NOT AVAILABLE THAT SHE NEEDED TO GO TO THE ED AND SEE IF SHE COULD GET HELP THERE. I STAYED ON THE PHONE WITH PT AND COMPLETED HER PHONE INTERVIEW.  PT WOULD SEEM TO PERK UP DURING CERTAIN PARTS OF INTERVIEW AND LAUGH BUT THEN WOULD GO BACK TO SAYING THAT SHE DIDN'T LIKE HOW SHE WAS FEELING.

## 2015-07-10 ENCOUNTER — Encounter: Payer: Self-pay | Admitting: *Deleted

## 2015-07-13 ENCOUNTER — Ambulatory Visit: Payer: BLUE CROSS/BLUE SHIELD

## 2015-07-13 ENCOUNTER — Encounter: Payer: Self-pay | Admitting: *Deleted

## 2015-07-13 ENCOUNTER — Ambulatory Visit: Payer: BLUE CROSS/BLUE SHIELD | Admitting: *Deleted

## 2015-07-13 ENCOUNTER — Encounter
Admission: RE | Disposition: A | Discharge status: Home / Self Care | Payer: Self-pay | Source: Ambulatory Visit | Attending: Internal Medicine

## 2015-07-13 ENCOUNTER — Ambulatory Visit
Admission: RE | Admit: 2015-07-13 | Discharge: 2015-07-13 | Disposition: A | Discharge status: Home / Self Care | Payer: BLUE CROSS/BLUE SHIELD | Source: Ambulatory Visit | Attending: Internal Medicine | Admitting: Internal Medicine

## 2015-07-13 DIAGNOSIS — Z800 Family history of malignant neoplasm of digestive organs: Secondary | ICD-10-CM | POA: Diagnosis not present

## 2015-07-13 DIAGNOSIS — K8681 Exocrine pancreatic insufficiency: Secondary | ICD-10-CM | POA: Diagnosis not present

## 2015-07-13 DIAGNOSIS — Z8781 Personal history of (healed) traumatic fracture: Secondary | ICD-10-CM | POA: Diagnosis not present

## 2015-07-13 DIAGNOSIS — K861 Other chronic pancreatitis: Secondary | ICD-10-CM | POA: Insufficient documentation

## 2015-07-13 DIAGNOSIS — R590 Localized enlarged lymph nodes: Secondary | ICD-10-CM | POA: Diagnosis present

## 2015-07-13 DIAGNOSIS — K219 Gastro-esophageal reflux disease without esophagitis: Secondary | ICD-10-CM | POA: Insufficient documentation

## 2015-07-13 DIAGNOSIS — R918 Other nonspecific abnormal finding of lung field: Secondary | ICD-10-CM

## 2015-07-13 DIAGNOSIS — C3411 Malignant neoplasm of upper lobe, right bronchus or lung: Secondary | ICD-10-CM | POA: Insufficient documentation

## 2015-07-13 DIAGNOSIS — F419 Anxiety disorder, unspecified: Secondary | ICD-10-CM | POA: Insufficient documentation

## 2015-07-13 DIAGNOSIS — R0602 Shortness of breath: Secondary | ICD-10-CM | POA: Insufficient documentation

## 2015-07-13 DIAGNOSIS — R0600 Dyspnea, unspecified: Secondary | ICD-10-CM | POA: Diagnosis not present

## 2015-07-13 DIAGNOSIS — Z87440 Personal history of urinary (tract) infections: Secondary | ICD-10-CM | POA: Insufficient documentation

## 2015-07-13 DIAGNOSIS — Z9889 Other specified postprocedural states: Secondary | ICD-10-CM | POA: Diagnosis not present

## 2015-07-13 DIAGNOSIS — C3490 Malignant neoplasm of unspecified part of unspecified bronchus or lung: Secondary | ICD-10-CM

## 2015-07-13 DIAGNOSIS — F17200 Nicotine dependence, unspecified, uncomplicated: Secondary | ICD-10-CM | POA: Insufficient documentation

## 2015-07-13 HISTORY — PX: ENDOBRONCHIAL ULTRASOUND: SHX5096

## 2015-07-13 HISTORY — DX: Malignant neoplasm of unspecified part of unspecified bronchus or lung: C34.90

## 2015-07-13 HISTORY — DX: Reserved for inherently not codable concepts without codable children: IMO0001

## 2015-07-13 SURGERY — ENDOBRONCHIAL ULTRASOUND (EBUS)
Anesthesia: General

## 2015-07-13 MED ORDER — HYDROMORPHONE HCL 1 MG/ML IJ SOLN
0.2500 mg | INTRAMUSCULAR | Status: DC | PRN
  Administered 2015-07-13 (×4): 0.5 mg via INTRAVENOUS

## 2015-07-13 MED ORDER — MORPHINE SULFATE (PF) 2 MG/ML IV SOLN
2.0000 mg | Freq: Once | INTRAVENOUS | Status: AC
  Administered 2015-07-13: 2 mg via INTRAVENOUS

## 2015-07-13 MED ORDER — MIDAZOLAM HCL 2 MG/2ML IJ SOLN
INTRAMUSCULAR | Status: AC
  Administered 2015-07-13: 2 mg
  Filled 2015-07-13: qty 2

## 2015-07-13 MED ORDER — ONDANSETRON HCL 4 MG/2ML IJ SOLN
INTRAMUSCULAR | Status: DC | PRN
  Administered 2015-07-13: 4 mg via INTRAVENOUS

## 2015-07-13 MED ORDER — LIDOCAINE HCL (CARDIAC) 20 MG/ML IV SOLN
INTRAVENOUS | Status: DC | PRN
  Administered 2015-07-13: 100 mg via INTRAVENOUS

## 2015-07-13 MED ORDER — MORPHINE SULFATE (PF) 4 MG/ML IV SOLN
INTRAVENOUS | Status: AC
  Filled 2015-07-13: qty 1

## 2015-07-13 MED ORDER — PHENYLEPHRINE HCL 10 MG/ML IJ SOLN
INTRAMUSCULAR | Status: DC | PRN
  Administered 2015-07-13 (×2): 100 ug via INTRAVENOUS

## 2015-07-13 MED ORDER — DEXAMETHASONE SODIUM PHOSPHATE 10 MG/ML IJ SOLN
INTRAMUSCULAR | Status: DC | PRN
  Administered 2015-07-13: 10 mg via INTRAVENOUS

## 2015-07-13 MED ORDER — SUCCINYLCHOLINE CHLORIDE 20 MG/ML IJ SOLN
INTRAMUSCULAR | Status: DC | PRN
  Administered 2015-07-13: 100 mg via INTRAVENOUS

## 2015-07-13 MED ORDER — LIDOCAINE HCL 2 % EX GEL: 1.0000 "application " | Freq: Once | CUTANEOUS | Status: DC

## 2015-07-13 MED ORDER — HYDROMORPHONE HCL 1 MG/ML IJ SOLN
INTRAMUSCULAR | Status: AC
  Administered 2015-07-13: 0.5 mg via INTRAVENOUS
  Filled 2015-07-13: qty 1

## 2015-07-13 MED ORDER — BUTAMBEN-TETRACAINE-BENZOCAINE 2-2-14 % EX AERO
1.0000 | INHALATION_SPRAY | Freq: Once | CUTANEOUS | Status: DC
  Filled 2015-07-13: qty 20

## 2015-07-13 MED ORDER — FENTANYL CITRATE (PF) 100 MCG/2ML IJ SOLN
INTRAMUSCULAR | Status: DC | PRN
  Administered 2015-07-13 (×3): 50 ug via INTRAVENOUS

## 2015-07-13 MED ORDER — ONDANSETRON HCL 4 MG/2ML IJ SOLN: 4.0000 mg | Freq: Once | INTRAMUSCULAR | Status: DC | PRN

## 2015-07-13 MED ORDER — LACTATED RINGERS IV SOLN
INTRAVENOUS | Status: DC
  Administered 2015-07-13: 13:00:00 via INTRAVENOUS

## 2015-07-13 MED ORDER — MIDAZOLAM HCL 2 MG/2ML IJ SOLN
INTRAMUSCULAR | Status: DC | PRN
  Administered 2015-07-13: 2 mg via INTRAVENOUS

## 2015-07-13 MED ORDER — MIDAZOLAM HCL 5 MG/ML IJ SOLN: 2.0000 mg | Freq: Once | INTRAMUSCULAR | Status: DC

## 2015-07-13 MED ORDER — ROCURONIUM BROMIDE 100 MG/10ML IV SOLN
INTRAVENOUS | Status: DC | PRN
  Administered 2015-07-13: 20 mg via INTRAVENOUS

## 2015-07-13 MED ORDER — PHENYLEPHRINE HCL 0.25 % NA SOLN
1.0000 | Freq: Four times a day (QID) | NASAL | Status: DC | PRN
Start: 2015-07-13 — End: 2015-07-13
  Filled 2015-07-13: qty 15

## 2015-07-13 MED ORDER — SUGAMMADEX SODIUM 200 MG/2ML IV SOLN
INTRAVENOUS | Status: DC | PRN
  Administered 2015-07-13: 120 mg via INTRAVENOUS

## 2015-07-13 MED ORDER — PROPOFOL 10 MG/ML IV BOLUS
INTRAVENOUS | Status: DC | PRN
  Administered 2015-07-13: 200 mg via INTRAVENOUS

## 2015-07-13 NOTE — H&P (Signed)
H&P reviewed, no interval changes. Pt notes continued symptoms which are unchanged from previous visit.   Physical Examination:   PS:UGAYGEFU.  General Appearance: No distress  Neuro:without focal findings, speech normal,  HEENT: PERRLA, EOM intact.  Pulmonary: normal breath sounds, No wheezing.  CardiovascularNormal S1,S2. No m/r/g.  Abdomen: Benign, Soft, non-tender. Renal: No costovertebral tenderness  GU: No performed at this time. Endoc: No evident thyromegaly, no signs of acromegaly. Skin: warm, no rashes, no ecchymosis  Extremities: normal, no cyanosis, clubbing.  A: Lung mass.  P: Will proceed with EBUS.

## 2015-07-13 NOTE — Procedures (Signed)
  [facility]            EBUS Bronchoscopy Note   FINDINGS/SUMMARY:   -Normal airways with moderate mucosal secretions, thick, copious mucus throughout both lungs. -Moderate erythema and edema throughout both lungs. -No endobronchial lesions were noted. -Right upper lobe transbronchial forceps biopsies 3. -Right upper lobe transbronchial cytology brushing 1. -Right upper lobe. Washings sent for cytology, culture, fungal, differential. -Right paratracheal, 4R node. EBUS guided biopsies 3 passes.   Indication: The patient is a 45 year old female with a incidentally found right upper lobe lung mass, with right paratracheal lymph node enlargement. The patient (or their representative) was informed of the risks (including but not limited to bleeding, infection, respiratory failure, lung injury, tooth/oral injury) and benefits of the procedure and gave consent, see chart.   Pre-op diagnosis: Lung mass Post-op diagnosis: Same Estimated blood loss: 5 ML  Medications for procedure: See anesthesia note  Procedure description:  The patient was brought to the endoscopy suite, she was intubated by anesthesia. Please see their note for greater details. Subsequently, the EBUS scope was taken, the endotracheal tube was retracted by 2 cm at the right paratracheal node could be visualized. Under real-time ultrasound guidance. 3 passes were made and the right paratracheal node with good returns. Scope was also used to examine for other areas of lymphadenopathy. No areas of enlarged lymphadenopathy were seen in the right hilar or in the subcarinal areas. The EBUS scope was removed, the white light bronchoscope was advanced and anatomical tour was undertaken, all segments were visualized except the right upper lobe apical segment, which could not be reached. There was thick mucus throughout both airways, which were easily suctioned. Under real-time fluoroscopic guidance, transbronchial  biopsies were taken from the right upper lobe anterior segment, with good returns. Subsequently cytology brush was passed in the same area. Washings were then taken from this area in 3 separate traps. Bronchoscope was then removed.   Condition post procedure: Stable   Complications: None   Deep Ashby Dawes, MD.  Board Certified in Internal Medicine, Pulmonary Medicine, [place], and Sleep Medicine.  [facility]  Patricia Pesa, M.D.  Vilinda Boehringer, M.D.  Cheral Marker, M.D

## 2015-07-13 NOTE — Transfer of Care (Signed)
Immediate Anesthesia Transfer of Care Note  Patient: Tiffany Erickson  Procedure(s) Performed: Procedure(s): ENDOBRONCHIAL ULTRASOUND (N/A)  Patient Location: PACU  Anesthesia Type:General  Level of Consciousness: awake, alert , oriented and patient cooperative  Airway & Oxygen Therapy: Patient Spontanous Breathing and Patient connected to nasal cannula oxygen  Post-op Assessment: Report given to RN, Post -op Vital signs reviewed and stable and Patient moving all extremities X 4  Post vital signs: Reviewed and stable  Last Vitals:  Filed Vitals:   07/13/15 1212  BP: 108/75  Pulse: 109  Temp: 37.1 C  Resp: 18    Complications: No apparent anesthesia complications

## 2015-07-13 NOTE — Anesthesia Postprocedure Evaluation (Signed)
Anesthesia Post Note  Patient: Tiffany Erickson  Procedure(s) Performed: Procedure(s) (LRB): ENDOBRONCHIAL ULTRASOUND (N/A)  Patient location during evaluation: PACU Anesthesia Type: General Level of consciousness: awake Pain management: pain level controlled Vital Signs Assessment: post-procedure vital signs reviewed and stable Respiratory status: spontaneous breathing Cardiovascular status: blood pressure returned to baseline Postop Assessment: no headache Anesthetic complications: no    Last Vitals:  Filed Vitals:   07/13/15 1212  BP: 108/75  Pulse: 109  Temp: 37.1 C  Resp: 18    Last Pain:  Filed Vitals:   07/13/15 1213  PainSc: 7                  Victorious Cosio, Willean M

## 2015-07-13 NOTE — Anesthesia Preprocedure Evaluation (Addendum)
Anesthesia Evaluation  Patient identified by MRN, date of birth, ID band Patient awake    Reviewed: Allergy & Precautions, NPO status , Patient's Chart, lab work & pertinent test results  Airway Mallampati: II  TM Distance: >3 FB Neck ROM: Full    Dental  (+) Teeth Intact, Dental Advisory Given   Pulmonary shortness of breath and with exertion, Current Smoker,  30 pack year hx--now down to 1/2 pk/day.   breath sounds clear to auscultation       Cardiovascular Exercise Tolerance: Poor  Rhythm:Regular Rate:Normal     Neuro/Psych Anxiety    GI/Hepatic GERD  Medicated and Controlled,  Endo/Other    Renal/GU      Musculoskeletal   Abdominal (+)  Abdomen: soft.    Peds  Hematology   Anesthesia Other Findings Hx tob/etoh, fall off a ladder and multiple fractured ribs on the LEFT side (chart says right side, but it is the left). Lung mass found on CXR.  Reproductive/Obstetrics                            Anesthesia Physical Anesthesia Plan  ASA: III  Anesthesia Plan: General   Post-op Pain Management:    Induction: Intravenous  Airway Management Planned: Oral ETT  Additional Equipment:   Intra-op Plan:   Post-operative Plan: Extubation in OR  Informed Consent: I have reviewed the patients History and Physical, chart, labs and discussed the procedure including the risks, benefits and alternatives for the proposed anesthesia with the patient or authorized representative who has indicated his/her understanding and acceptance.   Dental advisory given  Plan Discussed with: CRNA  Anesthesia Plan Comments:         Anesthesia Quick Evaluation

## 2015-07-13 NOTE — Discharge Instructions (Signed)
AMBULATORY SURGERY  °DISCHARGE INSTRUCTIONS ° ° °1) The drugs that you were given will stay in your system until tomorrow so for the next 24 hours you should not: ° °A) Drive an automobile °B) Make any legal decisions °C) Drink any alcoholic beverage ° ° °2) You may resume regular meals tomorrow.  Today it is better to start with liquids and gradually work up to solid foods. ° °You may eat anything you prefer, but it is better to start with liquids, then soup and crackers, and gradually work up to solid foods. ° ° °3) Please notify your doctor immediately if you have any unusual bleeding, trouble breathing, redness and pain at the surgery site, drainage, fever, or pain not relieved by medication. ° ° ° °4) Additional Instructions: ° ° ° ° ° ° ° °Please contact your physician with any problems or Same Day Surgery at [phone], Monday through Friday 6 am to 4 pm, or [place] at [facility] Main number at [phone]. °

## 2015-07-13 NOTE — Anesthesia Procedure Notes (Signed)
Procedure Name: Intubation Date/Time: 07/13/2015 1:39 PM Performed by: Silvana Newness Pre-anesthesia Checklist: Patient identified, Emergency Drugs available, Suction available, Patient being monitored and Timeout performed Patient Re-evaluated:Patient Re-evaluated prior to inductionOxygen Delivery Method: Circle system utilized Preoxygenation: Pre-oxygenation with 100% oxygen Intubation Type: IV induction Ventilation: Mask ventilation without difficulty Laryngoscope Size: Mac and 3 Grade View: Grade I Tube type: Oral Tube size: 8.5 mm Number of attempts: 1 Airway Equipment and Method: Rigid stylet Placement Confirmation: ETT inserted through vocal cords under direct vision,  positive ETCO2 and breath sounds checked- equal and bilateral Secured at: 18 cm Tube secured with: Tape Dental Injury: Teeth and Oropharynx as per pre-operative assessment

## 2015-07-14 ENCOUNTER — Encounter: Payer: Self-pay | Admitting: Internal Medicine

## 2015-07-15 LAB — SURGICAL PATHOLOGY

## 2015-07-15 LAB — CYTOLOGY - NON PAP

## 2015-07-16 LAB — CULTURE, BAL-QUANTITATIVE

## 2015-07-16 LAB — CULTURE, BAL-QUANTITATIVE W GRAM STAIN
Culture: NO GROWTH
Special Requests: NORMAL

## 2015-07-17 ENCOUNTER — Ambulatory Visit: Payer: BLUE CROSS/BLUE SHIELD | Admitting: Internal Medicine

## 2015-07-17 LAB — CYTOLOGY - NON PAP

## 2015-07-20 ENCOUNTER — Telehealth: Payer: Self-pay | Admitting: Internal Medicine

## 2015-07-20 ENCOUNTER — Encounter: Payer: Self-pay | Admitting: Internal Medicine

## 2015-07-20 ENCOUNTER — Ambulatory Visit (INDEPENDENT_AMBULATORY_CARE_PROVIDER_SITE_OTHER): Payer: BLUE CROSS/BLUE SHIELD | Admitting: Internal Medicine

## 2015-07-20 VITALS — BP 126/72 | HR 93 | Ht 62.0 in | Wt 138.2 lb

## 2015-07-20 DIAGNOSIS — R058 Other specified cough: Secondary | ICD-10-CM

## 2015-07-20 DIAGNOSIS — R05 Cough: Secondary | ICD-10-CM

## 2015-07-20 MED ORDER — ESZOPICLONE 1 MG PO TABS: 1.0000 mg | ORAL_TABLET | Freq: Every evening | ORAL | Status: DC | PRN

## 2015-07-20 MED ORDER — AMOXICILLIN-POT CLAVULANATE 875-125 MG PO TABS: 1.0000 | ORAL_TABLET | Freq: Two times a day (BID) | ORAL | Status: DC

## 2015-07-20 NOTE — Patient Instructions (Addendum)
Plan for today:  -Sputum sample for culture. -Augmentin 875 tablet twice daily for 5 days. -Lunesta 1 mg by mouth daily at bedtime  If you do not hear from the [facility] regarding your tumor board appointment in the next week, please call our office.   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

## 2015-07-20 NOTE — Progress Notes (Signed)
* [facility]     Assessment and Plan:  Lung cancer, adenocarcinoma with right paratracheal node involvement. -EBUS lymph node sampling of the right paratracheal node positive for adeno carcinoma. Consistent with an N2 disease, given the presence of a 7 cm right lung mass. This would  represent stage IIIA lung cancer.  The patient has been referred back to the [facility] tumor board for further treatment planning.  Insomnia. -Patient expressed that she is having trouble sleeping with the recent anxiety from what she is going through. -She would prefer not to use Ambien because of side effects, explained the similar side effects and Lunesta and she will be willing to try this.  Multiple left-sided rib fractures. -Status post recent fall/trauma,   Alcohol abuse. -She is not currently drinking to excess. -Discussed the importance of cessation.  Nicotine abuse. -Discussed the importance of smoking cessation. Even with the current diagnosis of lung cancer, continued smoking can reduce her chance for survival.    Date: 07/20/2015  MRN# [id number] Tiffany Erickson June 30, 1970   Tiffany Erickson is a 45 y.o. old female seen in follow up for chief complaint of  Chief Complaint  Patient presents with  . Follow-up    bronch results. pt. c/o prod. cough clear in color. occ. wheezing. occ. chest tightness. denies chest pain.      HPI:    Medication:   Outpatient Encounter Prescriptions as of 07/20/2015  Medication Sig  . ALPRAZolam (XANAX) 1 MG tablet Take 0.5-1 mg by mouth 3 (three) times daily as needed for anxiety.   . benzonatate (TESSALON) 200 MG capsule Take 200 mg by mouth 3 (three) times daily as needed for cough.  . Melatonin 5 MG TABS Take 5 mg by mouth at bedtime as needed (sleep).  . metoCLOPramide (REGLAN) 10 MG tablet Take 1 tablet (10 mg total) by mouth every 8 (eight) hours as needed for nausea or vomiting. (Patient not taking: Reported on 07/06/2015)  .  naproxen (NAPROSYN) 500 MG tablet Take 500 mg by mouth as needed. Reported on 07/13/2015  . pantoprazole (PROTONIX) 20 MG tablet Take 20 mg by mouth every morning.   . promethazine (PHENERGAN) 25 MG tablet Take 25 mg by mouth every 6 (six) hours as needed for nausea or vomiting.  . traMADol (ULTRAM) 50 MG tablet Take 50 mg by mouth every 6 (six) hours as needed. Reported on 07/13/2015   No facility-administered encounter medications on file as of 07/20/2015.     Allergies:  Sulfa antibiotics  Review of Systems: Gen:  Denies  fever, sweats. HEENT: Denies blurred vision. Cvc:  No dizziness, chest pain or heaviness Resp:   She notes that she has been having some cough and sputum production since the bronchoscopy. Gi: Denies swallowing difficulty, Gu:  Denies bladder incontinence, burning urine Ext:   No Joint pain, stiffness. Skin: No skin rash, easy bruising. Endoc:  No polyuria, polydipsia. Psych: No depression, insomnia. Other:  All other systems were reviewed and found to be negative other than what is mentioned in the HPI.   Physical Examination:   VS: BP 126/72 mmHg  Pulse 93  Ht '5\' 2"'$  (1.575 m)  Wt 138 lb 3.2 oz (62.687 kg)  BMI 25.27 kg/m2  SpO2 97%  LMP 06/09/2015 (Approximate)  General Appearance: No distress  Neuro:without focal findings,  speech normal,  HEENT: PERRLA, EOM intact. Pulmonary: normal breath sounds, No wheezing.   CardiovascularNormal S1,S2.  No m/r/g.   Abdomen: Benign,  Soft, non-tender. Renal:  No costovertebral tenderness  GU:  Not performed at this time. Endoc: No evident thyromegaly, no signs of acromegaly. Skin:   warm, no rash. Extremities: normal, no cyanosis, clubbing.   LABORATORY PANEL:   CBC No results for input(s): WBC, HGB, HCT, PLT in the last 168 hours. ------------------------------------------------------------------------------------------------------------------  Chemistries  No results for input(s): NA, K, CL, CO2, GLUCOSE,  BUN, CREATININE, CALCIUM, MG, AST, ALT, ALKPHOS, BILITOT in the last 168 hours.  Invalid input(s): GFRCGP ------------------------------------------------------------------------------------------------------------------  Cardiac Enzymes No results for input(s): TROPONINI in the last 168 hours. ------------------------------------------------------------  RADIOLOGY:   No results found for this or any previous visit. No results found for this or any previous visit. ------------------------------------------------------------------------------------------------------------------  Thank  you for allowing [facility] to assist in the care of your patient. Our recommendations are noted above.  Please contact us if we can be of further service.   Marda Stalker, MD.  [facility] Office Number: [phone]  Patricia Pesa, M.D.  Vilinda Boehringer, M.D.  Merton Border, M.D

## 2015-07-20 NOTE — Telephone Encounter (Signed)
Patient wants to know the specific kind of cancer Dr. Juanell Fairly dx her with.  Please call to discuss.

## 2015-07-20 NOTE — Telephone Encounter (Signed)
Tried to call pt. No answer. LMOM for pt to call back.

## 2015-07-20 NOTE — Telephone Encounter (Signed)
Pt informed of what is listed per DR. Nothing further needed.

## 2015-07-21 ENCOUNTER — Other Ambulatory Visit: Payer: Self-pay | Admitting: Internal Medicine

## 2015-07-23 ENCOUNTER — Encounter: Payer: Self-pay | Admitting: *Deleted

## 2015-07-23 ENCOUNTER — Inpatient Hospital Stay (HOSPITAL_BASED_OUTPATIENT_CLINIC_OR_DEPARTMENT_OTHER): Payer: BLUE CROSS/BLUE SHIELD | Admitting: Cardiothoracic Surgery

## 2015-07-23 ENCOUNTER — Ambulatory Visit
Admission: RE | Admit: 2015-07-23 | Discharge: 2015-07-23 | Disposition: A | Discharge status: Home / Self Care | Payer: BLUE CROSS/BLUE SHIELD | Source: Ambulatory Visit | Attending: Radiation Oncology | Admitting: Radiation Oncology

## 2015-07-23 ENCOUNTER — Other Ambulatory Visit: Payer: Self-pay | Admitting: *Deleted

## 2015-07-23 ENCOUNTER — Inpatient Hospital Stay: Payer: BLUE CROSS/BLUE SHIELD | Attending: Internal Medicine | Admitting: Internal Medicine

## 2015-07-23 ENCOUNTER — Inpatient Hospital Stay: Payer: BLUE CROSS/BLUE SHIELD

## 2015-07-23 VITALS — BP 129/88 | HR 93 | Temp 97.8°F | Resp 18 | Ht 62.0 in | Wt 138.0 lb

## 2015-07-23 DIAGNOSIS — F17210 Nicotine dependence, cigarettes, uncomplicated: Secondary | ICD-10-CM | POA: Diagnosis not present

## 2015-07-23 DIAGNOSIS — C3411 Malignant neoplasm of upper lobe, right bronchus or lung: Secondary | ICD-10-CM | POA: Insufficient documentation

## 2015-07-23 DIAGNOSIS — G8929 Other chronic pain: Secondary | ICD-10-CM | POA: Insufficient documentation

## 2015-07-23 DIAGNOSIS — R112 Nausea with vomiting, unspecified: Secondary | ICD-10-CM | POA: Insufficient documentation

## 2015-07-23 DIAGNOSIS — Z79899 Other long term (current) drug therapy: Secondary | ICD-10-CM | POA: Diagnosis not present

## 2015-07-23 DIAGNOSIS — Z87891 Personal history of nicotine dependence: Secondary | ICD-10-CM | POA: Insufficient documentation

## 2015-07-23 DIAGNOSIS — K861 Other chronic pancreatitis: Secondary | ICD-10-CM | POA: Insufficient documentation

## 2015-07-23 DIAGNOSIS — Z9181 History of falling: Secondary | ICD-10-CM | POA: Insufficient documentation

## 2015-07-23 DIAGNOSIS — C7931 Secondary malignant neoplasm of brain: Secondary | ICD-10-CM | POA: Diagnosis not present

## 2015-07-23 DIAGNOSIS — Z87440 Personal history of urinary (tract) infections: Secondary | ICD-10-CM | POA: Insufficient documentation

## 2015-07-23 DIAGNOSIS — Z8781 Personal history of (healed) traumatic fracture: Secondary | ICD-10-CM | POA: Insufficient documentation

## 2015-07-23 DIAGNOSIS — R109 Unspecified abdominal pain: Secondary | ICD-10-CM | POA: Diagnosis not present

## 2015-07-23 DIAGNOSIS — G8918 Other acute postprocedural pain: Secondary | ICD-10-CM | POA: Diagnosis not present

## 2015-07-23 DIAGNOSIS — Z800 Family history of malignant neoplasm of digestive organs: Secondary | ICD-10-CM | POA: Insufficient documentation

## 2015-07-23 DIAGNOSIS — K219 Gastro-esophageal reflux disease without esophagitis: Secondary | ICD-10-CM | POA: Diagnosis not present

## 2015-07-23 DIAGNOSIS — F419 Anxiety disorder, unspecified: Secondary | ICD-10-CM | POA: Insufficient documentation

## 2015-07-23 DIAGNOSIS — Z510 Encounter for antineoplastic radiation therapy: Secondary | ICD-10-CM | POA: Insufficient documentation

## 2015-07-23 LAB — COMPREHENSIVE METABOLIC PANEL
ALT: 26 U/L (ref 14–54)
AST: 21 U/L (ref 15–41)
Albumin: 3.9 g/dL (ref 3.5–5.0)
Alkaline Phosphatase: 155 U/L — ABNORMAL HIGH (ref 38–126)
Anion gap: 7 (ref 5–15)
BUN: <5 mg/dL — ABNORMAL LOW (ref 6–20)
CHLORIDE: 101 mmol/L (ref 101–111)
CO2: 27 mmol/L (ref 22–32)
Calcium: 9.1 mg/dL (ref 8.9–10.3)
Creatinine, Ser: 0.57 mg/dL (ref 0.44–1.00)
Glucose, Bld: 106 mg/dL — ABNORMAL HIGH (ref 65–99)
POTASSIUM: 3.6 mmol/L (ref 3.5–5.1)
SODIUM: 135 mmol/L (ref 135–145)
Total Bilirubin: 0.8 mg/dL (ref 0.3–1.2)
Total Protein: 7.9 g/dL (ref 6.5–8.1)

## 2015-07-23 LAB — CBC WITH DIFFERENTIAL/PLATELET
BASOS ABS: 0.1 10*3/uL (ref 0–0.1)
Basophils Relative: 1 %
EOS ABS: 0.3 10*3/uL (ref 0–0.7)
EOS PCT: 3 %
HCT: 48.9 % — ABNORMAL HIGH (ref 35.0–47.0)
Hemoglobin: 16.4 g/dL — ABNORMAL HIGH (ref 12.0–16.0)
LYMPHS PCT: 28 %
Lymphs Abs: 3.1 10*3/uL (ref 1.0–3.6)
MCH: 32.1 pg (ref 26.0–34.0)
MCHC: 33.5 g/dL (ref 32.0–36.0)
MCV: 95.7 fL (ref 80.0–100.0)
MONO ABS: 0.7 10*3/uL (ref 0.2–0.9)
Monocytes Relative: 7 %
Neutro Abs: 7 10*3/uL — ABNORMAL HIGH (ref 1.4–6.5)
Neutrophils Relative %: 61 %
PLATELETS: 369 10*3/uL (ref 150–440)
RBC: 5.11 MIL/uL (ref 3.80–5.20)
RDW: 15.1 % — AB (ref 11.5–14.5)
WBC: 11.2 10*3/uL — AB (ref 3.6–11.0)

## 2015-07-23 LAB — PROTIME-INR
INR: 0.92
PROTHROMBIN TIME: 12.6 s (ref 11.4–15.0)

## 2015-07-23 LAB — LACTATE DEHYDROGENASE: LDH: 150 U/L (ref 98–192)

## 2015-07-23 LAB — HCG, QUANTITATIVE, PREGNANCY: HCG, BETA CHAIN, QUANT, S: 1 m[IU]/mL (ref <5)

## 2015-07-23 LAB — APTT: APTT: 27 s (ref 24–36)

## 2015-07-23 NOTE — Consult Note (Signed)
Except an outstanding is perfect of Radiation Oncology NEW PATIENT EVALUATION  Name: Tiffany Erickson  MRN: [id number]  Date:   07/23/2015     DOB: 10-14-1970   This 45 y.o. female patient presents to the clinic for initial evaluation of stage IIIa (T2 N2 M0) non-small cell lung cancer right upper lobe.  REFERRING PHYSICIAN: No ref. provider found  CHIEF COMPLAINT: No chief complaint on file.   DIAGNOSIS: The encounter diagnosis was Malignant neoplasm of right upper lobe of lung ([place]).   PREVIOUS INVESTIGATIONS:  CT scan reviewed PET scan has been ordered Pathology report reviewed Clinical notes reviewed Case presented at weekly tumor conference  HPI: Patient is a 45 year old female who was seen in emergency room after falling off a ladder trying to change his smoking detector and upon workup was noted to have a right upper lobe lung mass. She did fracture some ribs although had no prior complaints of cough hemoptysis or chest tightness. CT scan demonstrated a large right upper lobe lung mass concerning for malignancy. She also had evidence of paratracheal lymph node. Underwent E BUS with biopsy of her paratracheal lymph node showing metastatic adenocarcinoma consistent with lung primary. PET scan and brain MRI scan have been ordered for complete metastatic workup. She is seen today in lung clinic. She has been discussed at weekly tumor conference and recognition was made for concurrent chemotherapy and radiation with curative intent.  PLANNED TREATMENT REGIMEN: Chemoradiation with curative intent  PAST MEDICAL HISTORY:  has a past medical history of Chronic pancreatitis ([place]); Multiple fractures of ribs of right side (06/01/2015); Concussion (06/01/2015); Lung mass; Anxiety; GERD (gastroesophageal reflux disease); Frequent UTI; Shortness of breath dyspnea; and Lung cancer ([place]) (07/13/15).    PAST SURGICAL HISTORY:  Past Surgical History  Procedure Laterality Date  . Tonsillectomy    .  Myringotomy with tube placement Bilateral   . Eye muscle surgery Left ~ 1979  . Esophagogastroduodenoscopy    . Endobronchial ultrasound N/A 07/13/2015    Procedure: ENDOBRONCHIAL ULTRASOUND;  Surgeon: Laverle Hobby, MD;  Location: [facility] ORS;  Service: Pulmonary;  Laterality: N/A;    FAMILY HISTORY: family history includes CAD in her maternal grandfather; Colon cancer in her maternal grandfather; Pancreatic cancer in her maternal grandmother.  SOCIAL HISTORY:  reports that she has been smoking Cigarettes.  She has a 14 pack-year smoking history. She has never used smokeless tobacco. She reports that she drinks about 8.4 oz of alcohol per week. She reports that she does not use illicit drugs.  ALLERGIES: Sulfa antibiotics  MEDICATIONS:  Current Outpatient Prescriptions  Medication Sig Dispense Refill  . ALPRAZolam (XANAX) 1 MG tablet Take 0.5-1 mg by mouth 3 (three) times daily as needed for anxiety.     Marland Kitchen amoxicillin-clavulanate (AUGMENTIN) 875-125 MG tablet Take 1 tablet by mouth 2 (two) times daily. 10 tablet 0  . eszopiclone (LUNESTA) 1 MG TABS tablet Take 1 tablet (1 mg total) by mouth at bedtime as needed for sleep. Take immediately before bedtime 30 tablet 1  . metoCLOPramide (REGLAN) 10 MG tablet Take 1 tablet (10 mg total) by mouth every 8 (eight) hours as needed for nausea or vomiting. 10 tablet 0  . pantoprazole (PROTONIX) 20 MG tablet Take 20 mg by mouth every morning.     . promethazine (PHENERGAN) 25 MG tablet Take 25 mg by mouth every 6 (six) hours as needed for nausea or vomiting.     No current facility-administered medications for this encounter.  ECOG PERFORMANCE STATUS:  0 - Asymptomatic  REVIEW OF SYSTEMS:  Patient denies any weight loss, fatigue, weakness, fever, chills or night sweats. Patient denies any loss of vision, blurred vision. Patient denies any ringing  of the ears or hearing loss. No irregular heartbeat. Patient denies heart murmur or history of  fainting. Patient denies any chest pain or pain radiating to her upper extremities. Patient denies any shortness of breath, difficulty breathing at night, cough or hemoptysis. Patient denies any swelling in the lower legs. Patient denies any nausea vomiting, vomiting of blood, or coffee ground material in the vomitus. Patient denies any stomach pain. Patient states has had normal bowel movements no significant constipation or diarrhea. Patient denies any dysuria, hematuria or significant nocturia. Patient denies any problems walking, swelling in the joints or loss of balance. Patient denies any skin changes, loss of hair or loss of weight. Patient denies any excessive worrying or anxiety or significant depression. Patient denies any problems with insomnia. Patient denies excessive thirst, polyuria, polydipsia. Patient denies any swollen glands, patient denies easy bruising or easy bleeding. Patient denies any recent infections, allergies or URI. Patient "s visual fields have not changed significantly in recent time.    PHYSICAL EXAM: LMP 06/09/2015 (Approximate) Well-developed well-nourished patient in NAD. HEENT reveals PERLA, EOMI, discs not visualized.  Oral cavity is clear. No oral mucosal lesions are identified. Neck is clear without evidence of cervical or supraclavicular adenopathy. Lungs are clear to A&P. Cardiac examination is essentially unremarkable with regular rate and rhythm without murmur rub or thrill. Abdomen is benign with no organomegaly or masses noted. Motor sensory and DTR levels are equal and symmetric in the upper and lower extremities. Cranial nerves II through XII are grossly intact. Proprioception is intact. No peripheral adenopathy or edema is identified. No motor or sensory levels are noted. Crude visual fields are within normal range.  LABORATORY DATA: Cytology reports reviewed as well as demonstrated at tumor conference    RADIOLOGY RESULTS: CT scan reviewed PET CT and brain  MRI pending   IMPRESSION: Stage IIIa adenocarcinoma of the right upper lobe in 45 year old female  PLAN: Have discussed the case with surgical oncology as well as medical oncology. Favor concurrent chemoradiation with curative and tends. Tumor is rather large would favor slightly dose escalated treatment with IM RT radiation therapy up to 6600 cGy with concurrent chemotherapy. Risks and benefits of treatment including cough possible radiation esophagitis fatigue alteration of blood counts skin reaction all were discussed in detail with the patient. She seems to comprehend my treatment plan well. I have set up and ordered CT simulation early next week. Patient will have port placed complete her metastatic workup. Depending on findings metastatic workup treatment plan may change and will review those tests as they become available.  I would like to take this opportunity for allowing me to participate in the care of your patient.Armstead Peaks., MD

## 2015-07-23 NOTE — Progress Notes (Signed)
Pt here for lung cancer dx and to get plan of care for her dx.pt states she has no pain at this time but does often from chronic pancreatitis and she has rib fractures on right side that hurt and sometimes cause her to have sob

## 2015-07-23 NOTE — Progress Notes (Signed)
[facility] CONSULT NOTE  Patient Care Team: Pcp Not In System as PCP - General  CHIEF COMPLAINTS/PURPOSE OF CONSULTATION:   # FEB 2017- ADENOCARCINOMA RUL [T2N2; STAGE III  Bronch/FNA- Right paratracheal LN;Dr.Ram].   # "Chronic pancreatitis"/ smoker [15ppt]  HISTORY OF PRESENTING ILLNESS:  Tiffany Erickson 45 y.o.  female with history of "chronic pancreatitis" as per history without any obvious cause- lost her balance and fell hitting her left ribs for which she ended up in the emergency room late December 2016. Further investigations- including a CT of the chest with contrast showed a 4 x 6 cm right upper lobe mass; with mediastinal adenopathy. She further underwent bronchoscopy with biopsy- pathology summarized above.  Patient admits to chronic nausea with intermittent vomiting; chronic abdominal pain. She denies any excessive alcohol abuse. No history of any gallstones. Patient denies any tingling and numbness of her feet. Denies any unusual chest pain except for the pain. No significant shortness of breath; no significant cough or hemoptysis. No hoarseness of voice. No vision changes or double vision.   ROS: A complete 10 point review of system is done which is negative except mentioned above in history of present illness  MEDICAL HISTORY:  Past Medical History  Diagnosis Date  . Chronic pancreatitis ([place])   . Multiple fractures of ribs of right side 06/01/2015    "5" S/P fall  LEFT SIDE  . Concussion 06/01/2015    S/P fall-PT STATES IN PHONE INTERVIEW THAT SHE IS UNSURE IF SHE HAD A CONCUSSION OR NOT  . Lung mass     right side noted 06/01/2015  . Anxiety   . GERD (gastroesophageal reflux disease)   . Frequent UTI     "sometimes" (06/01/2015)  . Shortness of breath dyspnea     5 rib fx right side  . Lung cancer ([place]) 07/13/15    METASTATIC ADENOCARCINOMA OF THE LUNG    SURGICAL HISTORY: Past Surgical History  Procedure Laterality Date  . Tonsillectomy    .  Myringotomy with tube placement Bilateral   . Eye muscle surgery Left ~ 1979  . Esophagogastroduodenoscopy    . Endobronchial ultrasound N/A 07/13/2015    Procedure: ENDOBRONCHIAL ULTRASOUND;  Surgeon: Laverle Hobby, MD;  Location: [facility] ORS;  Service: Pulmonary;  Laterality: N/A;    SOCIAL HISTORY: Social History   Social History  . Marital Status: Single    Spouse Name: N/A  . Number of Children: N/A  . Years of Education: N/A   Occupational History  . Not on file.   Social History Main Topics  . Smoking status: Current Every Day Smoker -- 0.50 packs/day for 28 years    Types: Cigarettes  . Smokeless tobacco: Never Used  . Alcohol Use: 8.4 oz/week    2 Glasses of wine, 12 Cans of beer per week     Comment: 06/01/2015 "drink probably 4 times/wk; either ;4 beers or 2 glasses of wine when I do drink"  . Drug Use: No  . Sexual Activity: Not Currently   Other Topics Concern  . Not on file   Social History Narrative    FAMILY HISTORY: Family History  Problem Relation Age of Onset  . Colon cancer Maternal Grandfather   . CAD Maternal Grandfather   . Pancreatic cancer Maternal Grandmother     ALLERGIES:  is allergic to sulfa antibiotics.  MEDICATIONS:  Current Outpatient Prescriptions  Medication Sig Dispense Refill  . ALPRAZolam (XANAX) 1 MG tablet Take 0.5-1 mg by  mouth 3 (three) times daily as needed for anxiety.     Marland Kitchen amoxicillin-clavulanate (AUGMENTIN) 875-125 MG tablet Take 1 tablet by mouth 2 (two) times daily. 10 tablet 0  . eszopiclone (LUNESTA) 1 MG TABS tablet Take 1 tablet (1 mg total) by mouth at bedtime as needed for sleep. Take immediately before bedtime 30 tablet 1  . metoCLOPramide (REGLAN) 10 MG tablet Take 1 tablet (10 mg total) by mouth every 8 (eight) hours as needed for nausea or vomiting. 10 tablet 0  . pantoprazole (PROTONIX) 20 MG tablet Take 20 mg by mouth every morning.     . promethazine (PHENERGAN) 25 MG tablet Take 25 mg by mouth every  6 (six) hours as needed for nausea or vomiting.     No current facility-administered medications for this visit.      Marland Kitchen  PHYSICAL EXAMINATION: ECOG PERFORMANCE STATUS: 0 - Asymptomatic  Filed Vitals:   07/23/15 1415  BP: 129/88  Pulse: 93  Temp: 97.8 F (36.6 C)  Resp: 18   Filed Weights   07/23/15 1415  Weight: 138 lb 0.1 oz (62.6 kg)    GENERAL: Well-nourished well-developed; Alert, no distress and comfortable. Accompanied by her mother/aunt.  EYES: no pallor or icterus OROPHARYNX: no thrush or ulceration; good dentition  NECK: supple, no masses felt LYMPH:  no palpable lymphadenopathy in the cervical, axillary or inguinal regions LUNGS: clear to auscultation and  No wheeze or crackles HEART/CVS: regular rate & rhythm and no murmurs; No lower extremity edema ABDOMEN: abdomen soft, non-tender and normal bowel sounds Musculoskeletal:no cyanosis of digits and no clubbing  PSYCH: alert & oriented x 3 with fluent speech; anxious NEURO: no focal motor/sensory deficits SKIN:  no rashes or significant lesions  LABORATORY DATA:  I have reviewed the data as listed Lab Results  Component Value Date   WBC 8.5 06/02/2015   HGB 15.7* 06/02/2015   HCT 48.2* 06/02/2015   MCV 101.3* 06/02/2015   PLT 257 06/02/2015    Recent Labs  09/25/14 2254 02/03/15 0235 06/01/15 0430 06/02/15 0551  NA 141 136 142 133*  K 3.8 3.4* 3.7 3.5  CL 107 100* 105 97*  CO2 26 23 23 28   GLUCOSE 130* 108* 105* 104*  BUN <5 <5* <5* <5*  CREATININE 0.59 0.77 0.58 0.57  CALCIUM 9.1 9.2 9.0 8.7*  GFRNONAA >60 >60 >60 >60  GFRAA >60 >60 >60 >60  PROT 8.0 8.2* 7.5  --   ALBUMIN 4.3 4.4 3.7  --   AST 106* 37 46*  --   ALT 84* 25 38  --   ALKPHOS 132* 103 129*  --   BILITOT 0.7 0.6 0.4  --     RADIOGRAPHIC STUDIES: I have personally reviewed the radiological images as listed and agreed with the findings in the report. Dg Chest 1 View  07/13/2015  CLINICAL DATA:  Post bronchoscopy, shortness  of breath. EXAM: CHEST 1 VIEW COMPARISON:  06/02/2015. FINDINGS: Trachea is midline. Heart size normal. Right upper lobe mass is again seen. No pneumothorax post biopsy. Lungs are otherwise clear. No pleural fluid. IMPRESSION: Right upper lobe mass.  No pneumothorax after bronchoscopic biopsy. Electronically Signed   By: Lorin Picket M.D.   On: 07/13/2015 15:56   Dg C-arm 1-60 Min-no Report  07/13/2015  CLINICAL DATA: lung mass C-ARM 1-60 MINUTES Fluoroscopy was utilized by the requesting physician.  No radiographic interpretation.    ASSESSMENT & PLAN:    # STAGE III Adenocarcinoma T2N2; right  upper lobe. I recommend workup with PET scan;  MRI of the brain. We'll also get PFTs. We'll check with pathology regarding sending the tissue out for- EGF for ALK and ROs-1 [ however unlikely to get testing on FNA]   #  In general I discussed regarding definitive chemoradiation therapy for stage III lung cancer. Patient has been evaluated by radiation oncology. Discussed that chemotherapy would likely be weekly/and radiation Monday through Friday for 6-8 weeks. Patient will also likely need consolidation chemotherapy. Discussed the potential side effects including but not limited to-increasing fatigue, nausea vomiting, diarrhea, hair loss, sores in the mouth, increase risk of infection and also neuropathy.   # I also discussed the less likely possibility of treating her with "neoadjuvant chemotherapy" for 3 cycles- followed by surgery if patient has a great response. However statistically this is less likely.  # Check CBC CMP and LDH. Follow-up in approximately 1 week. Also discussed regarding port. Spoke to Dr. Donella Stade and Dr. Faith Rogue. I also reviewed the images myself reviewed the images the patient and family. Patient was also discussed the tumor conference this afternoon.  Thank you for allowing me to participate in the care of your pleasant patient. Please do not hesitate to contact me with questions or  concerns in the interim.  # 60 minutes face-to-face with the patient discussing the above plan of care; more than 50% of time spent on prognosis/ natural history; counseling and coordination.     Cammie Sickle, MD 07/23/2015 2:22 PM

## 2015-07-24 ENCOUNTER — Telehealth: Payer: Self-pay | Admitting: Cardiothoracic Surgery

## 2015-07-24 NOTE — Progress Notes (Signed)
Met with patient at thoracic clinic appointment including medical oncology, thoracic surgery, and radiation oncology. Introduced Programmer, multimedia and reviewed plan of care. Will follow.

## 2015-07-24 NOTE — Progress Notes (Signed)
Patient ID: Tiffany Erickson, female   DOB: [birth year], 45 y.o.   MRN: [id number]  No chief complaint on file.   Referred By Dr. Ephraim Hamburger Reason for Referral Lung Cancer  HPI Location, Quality, Duration, Severity, Timing, Context, Modifying Factors, Associated Signs and Symptoms.  Tiffany Erickson is a 45 y.o. female.  She was in her usual state of health until late December when she fell from her porch suffering multiple left-sided rib fractures. She came to our emergency department where a CT scan showed the presence of a large right upper lobe mass with mediastinal adenopathy. She was ultimately seen by our pulmonologist who performed a bronchoscopy and biopsy. That revealed metastatic adenocarcinoma. The final staging on the basis of the CT scan and pathology was at least stage IIIa. She does have a history of smoking and has smoked a half pack cigarettes a day for the last 15 years. She also has about 4 drinks per week. She is currently single. She does not have any children. There is no family history of lung cancer. She denied any recent weight loss or hemoptysis. She states that she does get short of breath with minimal activities. She has noticed some occasional wheezing as well. The patient tells me she has a long-standing history of nausea vomiting and chronic pancreatitis. She see multiple physicians here for that as well as several physicians at [facility] without any specific diagnosis or treatment being rendered.   Past Medical History  Diagnosis Date  . Chronic pancreatitis ([place])   . Multiple fractures of ribs of right side 06/01/2015    "5" S/P fall  LEFT SIDE  . Concussion 06/01/2015    S/P fall-PT STATES IN PHONE INTERVIEW THAT SHE IS UNSURE IF SHE HAD A CONCUSSION OR NOT  . Lung mass     right side noted 06/01/2015  . Anxiety   . GERD (gastroesophageal reflux disease)   . Frequent UTI     "sometimes" (06/01/2015)  . Shortness of breath dyspnea     5 rib fx right side  . Lung  cancer ([place]) 07/13/15    METASTATIC ADENOCARCINOMA OF THE LUNG    Past Surgical History  Procedure Laterality Date  . Tonsillectomy    . Myringotomy with tube placement Bilateral   . Eye muscle surgery Left ~ 1979  . Esophagogastroduodenoscopy    . Endobronchial ultrasound N/A 07/13/2015    Procedure: ENDOBRONCHIAL ULTRASOUND;  Surgeon: Laverle Hobby, MD;  Location: [facility] ORS;  Service: Pulmonary;  Laterality: N/A;    Family History  Problem Relation Age of Onset  . Colon cancer Maternal Grandfather   . CAD Maternal Grandfather   . Pancreatic cancer Maternal Grandmother     Social History Social History  Substance Use Topics  . Smoking status: Current Every Day Smoker -- 0.50 packs/day for 28 years    Types: Cigarettes  . Smokeless tobacco: Never Used  . Alcohol Use: 8.4 oz/week    2 Glasses of wine, 12 Cans of beer per week     Comment: 06/01/2015 "drink probably 4 times/wk; either ;4 beers or 2 glasses of wine when I do drink"    Allergies  Allergen Reactions  . Sulfa Antibiotics Other (See Comments)    Unknown-last had as a child    Current Outpatient Prescriptions  Medication Sig Dispense Refill  . ALPRAZolam (XANAX) 1 MG tablet Take 0.5-1 mg by mouth 3 (three) times daily as needed for anxiety.     Marland Kitchen amoxicillin-clavulanate (AUGMENTIN)  875-125 MG tablet Take 1 tablet by mouth 2 (two) times daily. 10 tablet 0  . eszopiclone (LUNESTA) 1 MG TABS tablet Take 1 tablet (1 mg total) by mouth at bedtime as needed for sleep. Take immediately before bedtime 30 tablet 1  . metoCLOPramide (REGLAN) 10 MG tablet Take 1 tablet (10 mg total) by mouth every 8 (eight) hours as needed for nausea or vomiting. 10 tablet 0  . pantoprazole (PROTONIX) 20 MG tablet Take 20 mg by mouth every morning.     . promethazine (PHENERGAN) 25 MG tablet Take 25 mg by mouth every 6 (six) hours as needed for nausea or vomiting.     No current facility-administered medications for this visit.       Review of Systems A complete review of systems was asked and was negative except for the following positive findings loss of appetite, cough, shortness of breath, wheezing, heartburn, nausea, anxiety, easy bruising.  Last menstrual period 06/09/2015.  Physical Exam CONSTITUTIONAL:  Pleasant, well-developed, well-nourished, and in no acute distress. EYES: Pupils equal and reactive to light, Sclera non-icteric EARS, NOSE, MOUTH AND THROAT:  The oropharynx was clear.  Dentition is good repair.  Oral mucosa pink and moist. LYMPH NODES:  Lymph nodes in the neck and axillae were normal RESPIRATORY:  Lungs were clear.  Normal respiratory effort without pathologic use of accessory muscles of respiration CARDIOVASCULAR: Heart was regular without murmurs.  There were no carotid bruits. GI: The abdomen was soft, nontender, and nondistended. There were no palpable masses. There was no hepatosplenomegaly. There were normal bowel sounds in all quadrants. GU:  Rectal deferred.   MUSCULOSKELETAL:  Normal muscle strength and tone.  No clubbing or cyanosis.   SKIN:  There were no pathologic skin lesions.  There were no nodules on palpation. NEUROLOGIC:  Sensation is normal.  Cranial nerves are grossly intact. PSYCH:  Oriented to person, place and time.  Mood and affect are normal.  Data Reviewed CT scan and pathology  I have personally reviewed the patient's imaging, laboratory findings and medical records.    Assessment    She was presented today at our multidisciplinary thoracic oncology conference. The pathology was reviewed. This does show metastatic adenocarcinoma. In addition she was seen today by our radiation therapist oncologist. The current recommendation is for definitive chemoradiation. I was asked to see her for my consideration of surgical resection for Port-A-Cath insertion. I reviewed with her the indications and risks of Port-A-Cath insertion including risks of bleeding, infection,  pneumothorax and death. I also discussed with her the role surgery and management of IIIa disease. She is scheduled to undergo PET scanning and MRI of the brain for definitive staging purposes.    Plan    We will go ahead and set her up for a Port-A-Cath insertion. Subsequent recommendations regarding surgical intervention would based upon her final staging.       [name], MD 07/24/2015, 8:46 AM

## 2015-07-24 NOTE — Telephone Encounter (Signed)
Pt advised of her pre op date/time and sx date.  Sx: 07/27/15 with Dr Rolley Sims Placement. Pre op: arrive two hours early.

## 2015-07-27 ENCOUNTER — Ambulatory Visit
Admission: RE | Admit: 2015-07-27 | Discharge: 2015-07-27 | Disposition: A | Discharge status: Home / Self Care | Payer: BLUE CROSS/BLUE SHIELD | Source: Ambulatory Visit | Attending: Cardiothoracic Surgery | Admitting: Cardiothoracic Surgery

## 2015-07-27 ENCOUNTER — Encounter
Admission: RE | Disposition: A | Discharge status: Home / Self Care | Payer: Self-pay | Source: Ambulatory Visit | Attending: Cardiothoracic Surgery

## 2015-07-27 ENCOUNTER — Encounter: Payer: Self-pay | Admitting: *Deleted

## 2015-07-27 ENCOUNTER — Ambulatory Visit: Payer: BLUE CROSS/BLUE SHIELD

## 2015-07-27 ENCOUNTER — Ambulatory Visit: Payer: BLUE CROSS/BLUE SHIELD | Admitting: Anesthesiology

## 2015-07-27 DIAGNOSIS — R112 Nausea with vomiting, unspecified: Secondary | ICD-10-CM | POA: Diagnosis not present

## 2015-07-27 DIAGNOSIS — R0602 Shortness of breath: Secondary | ICD-10-CM | POA: Diagnosis not present

## 2015-07-27 DIAGNOSIS — Z882 Allergy status to sulfonamides status: Secondary | ICD-10-CM | POA: Insufficient documentation

## 2015-07-27 DIAGNOSIS — C3411 Malignant neoplasm of upper lobe, right bronchus or lung: Secondary | ICD-10-CM | POA: Diagnosis not present

## 2015-07-27 DIAGNOSIS — C7931 Secondary malignant neoplasm of brain: Secondary | ICD-10-CM | POA: Insufficient documentation

## 2015-07-27 DIAGNOSIS — K219 Gastro-esophageal reflux disease without esophagitis: Secondary | ICD-10-CM | POA: Insufficient documentation

## 2015-07-27 DIAGNOSIS — K861 Other chronic pancreatitis: Secondary | ICD-10-CM | POA: Diagnosis not present

## 2015-07-27 DIAGNOSIS — F17210 Nicotine dependence, cigarettes, uncomplicated: Secondary | ICD-10-CM | POA: Diagnosis not present

## 2015-07-27 DIAGNOSIS — F419 Anxiety disorder, unspecified: Secondary | ICD-10-CM | POA: Diagnosis not present

## 2015-07-27 DIAGNOSIS — Z800 Family history of malignant neoplasm of digestive organs: Secondary | ICD-10-CM | POA: Insufficient documentation

## 2015-07-27 DIAGNOSIS — Z79899 Other long term (current) drug therapy: Secondary | ICD-10-CM | POA: Diagnosis not present

## 2015-07-27 DIAGNOSIS — Z8249 Family history of ischemic heart disease and other diseases of the circulatory system: Secondary | ICD-10-CM | POA: Insufficient documentation

## 2015-07-27 DIAGNOSIS — Z09 Encounter for follow-up examination after completed treatment for conditions other than malignant neoplasm: Secondary | ICD-10-CM

## 2015-07-27 HISTORY — DX: Fracture of one rib, unspecified side, initial encounter for closed fracture: S22.39XA

## 2015-07-27 HISTORY — PX: PORTACATH PLACEMENT: SHX2246

## 2015-07-27 LAB — POCT PREGNANCY, URINE: Preg Test, Ur: NEGATIVE

## 2015-07-27 SURGERY — INSERTION, TUNNELED CENTRAL VENOUS DEVICE, WITH PORT
Anesthesia: General | Laterality: Left | Wound class: Clean

## 2015-07-27 MED ORDER — CEFAZOLIN SODIUM 1 G IJ SOLR
INTRAMUSCULAR | Status: DC | PRN
  Administered 2015-07-27: 2 g via INTRAMUSCULAR

## 2015-07-27 MED ORDER — OXYCODONE HCL 5 MG PO TABS
ORAL_TABLET | ORAL | Status: AC
  Filled 2015-07-27: qty 1

## 2015-07-27 MED ORDER — PROPOFOL 10 MG/ML IV BOLUS: INTRAVENOUS | Status: DC | PRN

## 2015-07-27 MED ORDER — MIDAZOLAM HCL 2 MG/2ML IJ SOLN
INTRAMUSCULAR | Status: DC | PRN
  Administered 2015-07-27: 2 mg via INTRAVENOUS

## 2015-07-27 MED ORDER — MOXIFLOXACIN HCL 0.5 % OP SOLN
OPHTHALMIC | Status: AC
  Filled 2015-07-27: qty 3

## 2015-07-27 MED ORDER — PROPOFOL 10 MG/ML IV BOLUS
INTRAVENOUS | Status: DC | PRN
  Administered 2015-07-27: 130 mg via INTRAVENOUS
  Administered 2015-07-27: 40 mg via INTRAVENOUS
  Administered 2015-07-27: 30 mg via INTRAVENOUS
  Administered 2015-07-27: 40 mg via INTRAVENOUS
  Administered 2015-07-27 (×2): 30 mg via INTRAVENOUS

## 2015-07-27 MED ORDER — MIDAZOLAM HCL 5 MG/ML IJ SOLN
1.0000 mg | Freq: Once | INTRAMUSCULAR | Status: AC
  Administered 2015-07-27: 1 mg via INTRAVENOUS

## 2015-07-27 MED ORDER — LIDOCAINE HCL (CARDIAC) 20 MG/ML IV SOLN: INTRAVENOUS | Status: DC | PRN

## 2015-07-27 MED ORDER — OXYCODONE HCL 5 MG/5ML PO SOLN: 5.0000 mg | Freq: Once | ORAL | Status: AC | PRN

## 2015-07-27 MED ORDER — LIDOCAINE HCL (CARDIAC) 20 MG/ML IV SOLN
INTRAVENOUS | Status: DC | PRN
  Administered 2015-07-27: 60 mg via INTRAVENOUS

## 2015-07-27 MED ORDER — LACTATED RINGERS IV SOLN
INTRAVENOUS | Status: DC
Start: 2015-07-27 — End: 2015-07-27
  Administered 2015-07-27: 1 mL via INTRAVENOUS
  Administered 2015-07-27: 16:00:00 via INTRAVENOUS

## 2015-07-27 MED ORDER — OXYCODONE HCL 5 MG PO TABS
5.0000 mg | ORAL_TABLET | Freq: Once | ORAL | Status: AC | PRN
  Administered 2015-07-27: 5 mg via ORAL

## 2015-07-27 MED ORDER — CYCLOPENTOLATE HCL 2 % OP SOLN
OPHTHALMIC | Status: AC
  Filled 2015-07-27: qty 2

## 2015-07-27 MED ORDER — FENTANYL CITRATE (PF) 100 MCG/2ML IJ SOLN: 25.0000 ug | INTRAMUSCULAR | Status: DC | PRN

## 2015-07-27 MED ORDER — ONDANSETRON HCL 4 MG/2ML IJ SOLN
INTRAMUSCULAR | Status: DC | PRN
  Administered 2015-07-27: 4 mg via INTRAVENOUS

## 2015-07-27 MED ORDER — PHENYLEPHRINE HCL 10 MG/ML IJ SOLN
INTRAMUSCULAR | Status: DC | PRN
  Administered 2015-07-27: 100 ug via INTRAVENOUS

## 2015-07-27 MED ORDER — PHENYLEPHRINE HCL 10 % OP SOLN
OPHTHALMIC | Status: AC
  Filled 2015-07-27: qty 5

## 2015-07-27 MED ORDER — HEPARIN SODIUM (PORCINE) 5000 UNIT/ML IJ SOLN
INTRAMUSCULAR | Status: AC
  Filled 2015-07-27: qty 1

## 2015-07-27 MED ORDER — FENTANYL CITRATE (PF) 100 MCG/2ML IJ SOLN
INTRAMUSCULAR | Status: DC | PRN
Start: 2015-07-27 — End: 2015-07-27
  Administered 2015-07-27 (×4): 50 ug via INTRAVENOUS
  Administered 2015-07-27: 25 ug via INTRAVENOUS

## 2015-07-27 MED ORDER — LIDOCAINE HCL (PF) 1 % IJ SOLN
INTRAMUSCULAR | Status: AC
  Filled 2015-07-27: qty 30

## 2015-07-27 MED ORDER — LIDOCAINE HCL (PF) 1 % IJ SOLN
INTRAMUSCULAR | Status: DC | PRN
  Administered 2015-07-27: 7 mL

## 2015-07-27 MED ORDER — CEFAZOLIN SODIUM 1 G IJ SOLR
INTRAMUSCULAR | Status: AC
  Filled 2015-07-27: qty 20

## 2015-07-27 MED ORDER — HYDROCODONE-ACETAMINOPHEN 5-325 MG PO TABS: 1.0000 | ORAL_TABLET | ORAL | Status: DC | PRN

## 2015-07-27 MED ORDER — HEPARIN SODIUM (PORCINE) 1000 UNIT/ML IJ SOLN
INTRAMUSCULAR | Status: DC | PRN
  Administered 2015-07-27: 15 mL via INTRAMUSCULAR

## 2015-07-27 MED ORDER — MIDAZOLAM HCL 2 MG/2ML IJ SOLN
INTRAMUSCULAR | Status: AC
  Administered 2015-07-27: 1 mg
  Filled 2015-07-27: qty 2

## 2015-07-27 SURGICAL SUPPLY — 38 items
BAG DECANTER FOR FLEXI CONT (MISCELLANEOUS) ×2 IMPLANT
BLADE SURG SZ11 CARB STEEL (BLADE) ×2 IMPLANT
CANISTER SUCT 1200ML W/VALVE (MISCELLANEOUS) ×2 IMPLANT
CHLORAPREP W/TINT 26ML (MISCELLANEOUS) ×2 IMPLANT
COVER LIGHT HANDLE STERIS (MISCELLANEOUS) ×4 IMPLANT
DRAPE C-ARM XRAY 36X54 (DRAPES) ×2 IMPLANT
DRESSING TELFA 4X3 1S ST N-ADH (GAUZE/BANDAGES/DRESSINGS) IMPLANT
DRSG TEGADERM 2-3/8X2-3/4 SM (GAUZE/BANDAGES/DRESSINGS) ×2 IMPLANT
DRSG TEGADERM 4X4.75 (GAUZE/BANDAGES/DRESSINGS) ×2 IMPLANT
DRSG TELFA 3X8 NADH (GAUZE/BANDAGES/DRESSINGS) ×2 IMPLANT
ELECT CAUTERY BLADE TIP 2.5 (TIP) ×2
ELECT REM PT RETURN 9FT ADLT (ELECTROSURGICAL) ×2
ELECTRODE CAUTERY BLDE TIP 2.5 (TIP) ×1 IMPLANT
ELECTRODE REM PT RTRN 9FT ADLT (ELECTROSURGICAL) ×1 IMPLANT
GLOVE EXAM LX STRL 7.5 (GLOVE) ×5 IMPLANT
GOWN STRL REUS W/ TWL LRG LVL3 (GOWN DISPOSABLE) ×2 IMPLANT
GOWN STRL REUS W/TWL LRG LVL3 (GOWN DISPOSABLE) ×4
IV NS 500ML (IV SOLUTION) ×2
IV NS 500ML BAXH (IV SOLUTION) ×1 IMPLANT
KIT PORT POWER 8FR ISP CVUE (Catheter) ×2 IMPLANT
KIT RM TURNOVER STRD PROC AR (KITS) ×2 IMPLANT
LABEL OR SOLS (LABEL) ×2 IMPLANT
MARKER SKIN DUAL TIP RULER LAB (MISCELLANEOUS) ×2 IMPLANT
NDL SAFETY 22GX1.5 (NEEDLE) ×2 IMPLANT
NEEDLE FILTER BLUNT 18X 1/2SAF (NEEDLE) ×1
NEEDLE FILTER BLUNT 18X1 1/2 (NEEDLE) ×1 IMPLANT
NS IRRIG 500ML POUR BTL (IV SOLUTION) ×2 IMPLANT
PACK PORT-A-CATH (MISCELLANEOUS) ×2 IMPLANT
PAD DRESSING TELFA 3X8 NADH (GAUZE/BANDAGES/DRESSINGS) IMPLANT
SUT ETHILON 4-0 (SUTURE) ×2
SUT ETHILON 4-0 FS2 18XMFL BLK (SUTURE) ×1
SUT PROLENE 2 0 SH DA (SUTURE) ×3 IMPLANT
SUT VIC AB 3-0 SH 27 (SUTURE) ×2
SUT VIC AB 3-0 SH 27X BRD (SUTURE) ×1 IMPLANT
SUTURE ETHLN 4-0 FS2 18XMF BLK (SUTURE) ×2 IMPLANT
SYR 3ML LL SCALE MARK (SYRINGE) ×2 IMPLANT
SYRINGE 10CC LL (SYRINGE) IMPLANT
TAPE TRANSPORE STRL 2 31045 (GAUZE/BANDAGES/DRESSINGS) ×1 IMPLANT

## 2015-07-27 NOTE — Op Note (Signed)
07/27/2015  4:46 PM  PATIENT:  Tiffany Erickson  45 y.o. female  PRE-OPERATIVE DIAGNOSIS:  malignant neoplasm of right upper lobe of lung  POST-OPERATIVE DIAGNOSIS:  malignant neoplasm of right upper lobe of lung  PROCEDURE:  Procedure(s): INSERTION PORT-A-CATH (Left) using fluorscopy  SURGEON:  Surgeon(s) and Role:    * [name], MD - Primary  ASSISTANTS:  Leighton Ruff PAS  ANESTHESIA:   general  DICTATION:   The patient was brought to the operating suite and placed in the supine position. The patient was then prepped and draped in usual sterile fashion. The left subclavian vein was percutaneously catheterized. A wire was placed into the venous system under fluoroscopic guidance. An appropriate site was selected on the chest wall and a Port-A-Cath pocket was created. The catheter was tunneled from the port site up to the insertion site. The catheter was then inserted through a peel-away sheath and positioned at the appropriate level in the superior vena cava. The catheter was then assembled and aspirated and flushed easily. It was then secured to the anterior chest wall with interrupted Prolene sutures. The catheter was flushed one last time and the wounds were then closed. The subcutaneous tissues were closed with running absorbable sutures and the skin with nylon. Sterile dressings were applied. Patient was then transported to the recovery room in stable condition.   [name], MD

## 2015-07-27 NOTE — OR Nursing (Signed)
Dr. Elmer Bales notified of patient's anxiety.  He ordered versed 1 mg to start and she may have another '1mg'$  if needed.

## 2015-07-27 NOTE — Progress Notes (Signed)
Chaplain rounded the unit and offered a compassionate presence and support to the patient. Patient stated there is nothing we could do for her at this time. Chaplain Alvaro Aungst [phone] 

## 2015-07-27 NOTE — OR Nursing (Signed)
Remains somewhat anxious and surgery will be delayed slightly.  Will repeat 1 mg of Versed

## 2015-07-27 NOTE — Anesthesia Postprocedure Evaluation (Signed)
Anesthesia Post Note  Patient: Tiffany Erickson  Procedure(s) Performed: Procedure(s) (LRB): INSERTION PORT-A-CATH (Left)  Patient location during evaluation: PACU Anesthesia Type: General Level of consciousness: awake and alert Pain management: pain level controlled Vital Signs Assessment: post-procedure vital signs reviewed and stable Respiratory status: spontaneous breathing and respiratory function stable Cardiovascular status: stable Anesthetic complications: no    Last Vitals:  Filed Vitals:   07/27/15 1348 07/27/15 1639  BP: 122/79 100/76  Pulse: 82 107  Temp:  36.6 C  Resp: 16 22    Last Pain:  Filed Vitals:   07/27/15 1642  PainSc: 3                  KEPHART,WILLIAM K

## 2015-07-27 NOTE — H&P (View-Only) (Signed)
Patient ID: ALOMA BOCH, female   DOB: 08-Feb-1971, 45 y.o.   MRN: [id number]  No chief complaint on file.   Referred By Dr. Ephraim Hamburger Reason for Referral Lung Cancer  HPI Location, Quality, Duration, Severity, Timing, Context, Modifying Factors, Associated Signs and Symptoms.  Tiffany Erickson is a 45 y.o. female.  She was in her usual state of health until late December when she fell from her porch suffering multiple left-sided rib fractures. She came to our emergency department where a CT scan showed the presence of a large right upper lobe mass with mediastinal adenopathy. She was ultimately seen by our pulmonologist who performed a bronchoscopy and biopsy. That revealed metastatic adenocarcinoma. The final staging on the basis of the CT scan and pathology was at least stage IIIa. She does have a history of smoking and has smoked a half pack cigarettes a day for the last 15 years. She also has about 4 drinks per week. She is currently single. She does not have any children. There is no family history of lung cancer. She denied any recent weight loss or hemoptysis. She states that she does get short of breath with minimal activities. She has noticed some occasional wheezing as well. The patient tells me she has a long-standing history of nausea vomiting and chronic pancreatitis. She see multiple physicians here for that as well as several physicians at [facility] without any specific diagnosis or treatment being rendered.   Past Medical History  Diagnosis Date  . Chronic pancreatitis ([place])   . Multiple fractures of ribs of right side 06/01/2015    "5" S/P fall  LEFT SIDE  . Concussion 06/01/2015    S/P fall-PT STATES IN PHONE INTERVIEW THAT SHE IS UNSURE IF SHE HAD A CONCUSSION OR NOT  . Lung mass     right side noted 06/01/2015  . Anxiety   . GERD (gastroesophageal reflux disease)   . Frequent UTI     "sometimes" (06/01/2015)  . Shortness of breath dyspnea     5 rib fx right side  . Lung  cancer ([place]) 07/13/15    METASTATIC ADENOCARCINOMA OF THE LUNG    Past Surgical History  Procedure Laterality Date  . Tonsillectomy    . Myringotomy with tube placement Bilateral   . Eye muscle surgery Left ~ 1979  . Esophagogastroduodenoscopy    . Endobronchial ultrasound N/A 07/13/2015    Procedure: ENDOBRONCHIAL ULTRASOUND;  Surgeon: Laverle Hobby, MD;  Location: [facility] ORS;  Service: Pulmonary;  Laterality: N/A;    Family History  Problem Relation Age of Onset  . Colon cancer Maternal Grandfather   . CAD Maternal Grandfather   . Pancreatic cancer Maternal Grandmother     Social History Social History  Substance Use Topics  . Smoking status: Current Every Day Smoker -- 0.50 packs/day for 28 years    Types: Cigarettes  . Smokeless tobacco: Never Used  . Alcohol Use: 8.4 oz/week    2 Glasses of wine, 12 Cans of beer per week     Comment: 06/01/2015 "drink probably 4 times/wk; either ;4 beers or 2 glasses of wine when I do drink"    Allergies  Allergen Reactions  . Sulfa Antibiotics Other (See Comments)    Unknown-last had as a child    Current Outpatient Prescriptions  Medication Sig Dispense Refill  . ALPRAZolam (XANAX) 1 MG tablet Take 0.5-1 mg by mouth 3 (three) times daily as needed for anxiety.     Marland Kitchen amoxicillin-clavulanate (AUGMENTIN)  875-125 MG tablet Take 1 tablet by mouth 2 (two) times daily. 10 tablet 0  . eszopiclone (LUNESTA) 1 MG TABS tablet Take 1 tablet (1 mg total) by mouth at bedtime as needed for sleep. Take immediately before bedtime 30 tablet 1  . metoCLOPramide (REGLAN) 10 MG tablet Take 1 tablet (10 mg total) by mouth every 8 (eight) hours as needed for nausea or vomiting. 10 tablet 0  . pantoprazole (PROTONIX) 20 MG tablet Take 20 mg by mouth every morning.     . promethazine (PHENERGAN) 25 MG tablet Take 25 mg by mouth every 6 (six) hours as needed for nausea or vomiting.     No current facility-administered medications for this visit.       Review of Systems A complete review of systems was asked and was negative except for the following positive findings loss of appetite, cough, shortness of breath, wheezing, heartburn, nausea, anxiety, easy bruising.  Last menstrual period 06/09/2015.  Physical Exam CONSTITUTIONAL:  Pleasant, well-developed, well-nourished, and in no acute distress. EYES: Pupils equal and reactive to light, Sclera non-icteric EARS, NOSE, MOUTH AND THROAT:  The oropharynx was clear.  Dentition is good repair.  Oral mucosa pink and moist. LYMPH NODES:  Lymph nodes in the neck and axillae were normal RESPIRATORY:  Lungs were clear.  Normal respiratory effort without pathologic use of accessory muscles of respiration CARDIOVASCULAR: Heart was regular without murmurs.  There were no carotid bruits. GI: The abdomen was soft, nontender, and nondistended. There were no palpable masses. There was no hepatosplenomegaly. There were normal bowel sounds in all quadrants. GU:  Rectal deferred.   MUSCULOSKELETAL:  Normal muscle strength and tone.  No clubbing or cyanosis.   SKIN:  There were no pathologic skin lesions.  There were no nodules on palpation. NEUROLOGIC:  Sensation is normal.  Cranial nerves are grossly intact. PSYCH:  Oriented to person, place and time.  Mood and affect are normal.  Data Reviewed CT scan and pathology  I have personally reviewed the patient's imaging, laboratory findings and medical records.    Assessment    She was presented today at our multidisciplinary thoracic oncology conference. The pathology was reviewed. This does show metastatic adenocarcinoma. In addition she was seen today by our radiation therapist oncologist. The current recommendation is for definitive chemoradiation. I was asked to see her for my consideration of surgical resection for Port-A-Cath insertion. I reviewed with her the indications and risks of Port-A-Cath insertion including risks of bleeding, infection,  pneumothorax and death. I also discussed with her the role surgery and management of IIIa disease. She is scheduled to undergo PET scanning and MRI of the brain for definitive staging purposes.    Plan    We will go ahead and set her up for a Port-A-Cath insertion. Subsequent recommendations regarding surgical intervention would based upon her final staging.       [name], MD 07/24/2015, 8:46 AM

## 2015-07-27 NOTE — Anesthesia Procedure Notes (Signed)
Procedure Name: LMA Insertion Date/Time: 07/27/2015 3:26 PM Performed by: Lance Muss Pre-anesthesia Checklist: Patient identified, Emergency Drugs available, Suction available and Patient being monitored Patient Re-evaluated:Patient Re-evaluated prior to inductionOxygen Delivery Method: Circle system utilized Preoxygenation: Pre-oxygenation with 100% oxygen Intubation Type: IV induction Ventilation: Mask ventilation without difficulty LMA: LMA inserted LMA Size: 3.5 Number of attempts: 1 Tube secured with: Tape Dental Injury: Teeth and Oropharynx as per pre-operative assessment

## 2015-07-27 NOTE — Transfer of Care (Signed)
Immediate Anesthesia Transfer of Care Note  Patient: Tiffany Erickson  Procedure(s) Performed: Procedure(s): INSERTION PORT-A-CATH (Left)  Patient Location: PACU  Anesthesia Type:General  Level of Consciousness: awake, alert  and oriented  Airway & Oxygen Therapy: Patient Spontanous Breathing and Patient connected to face mask oxygen  Post-op Assessment: Report given to RN and Post -op Vital signs reviewed and stable  Post vital signs: Reviewed and stable  Last Vitals:  Filed Vitals:   07/27/15 1348 07/27/15 1639  BP: 122/79 100/76  Pulse: 82 107  Temp:  36.6 C  Resp: 16 22    Complications: No apparent anesthesia complications

## 2015-07-27 NOTE — Interval H&P Note (Signed)
History and Physical Interval Note:  07/27/2015 1:34 PM  Tiffany Erickson  has presented today for surgery, with the diagnosis of malignant neoplasm of right upper lobe of lung  The various methods of treatment have been discussed with the patient and family. After consideration of risks, benefits and other options for treatment, the patient has consented to  Procedure(s): INSERTION PORT-A-CATH (N/A) as a surgical intervention .  The patient's history has been reviewed, patient examined, no change in status, stable for surgery.  I have reviewed the patient's chart and labs.  Questions were answered to the patient's satisfaction.     [name]

## 2015-07-27 NOTE — Anesthesia Preprocedure Evaluation (Signed)
Anesthesia Evaluation  Patient identified by MRN, date of birth, ID band Patient awake    Reviewed: Allergy & Precautions, H&P , NPO status , Patient's Chart, lab work & pertinent test results  History of Anesthesia Complications Negative for: history of anesthetic complications  Airway Mallampati: II  TM Distance: >3 FB Neck ROM: full    Dental  (+) Poor Dentition, Chipped   Pulmonary neg shortness of breath, Current Smoker,    Pulmonary exam normal breath sounds clear to auscultation       Cardiovascular Exercise Tolerance: Good (-) angina(-) Past MI and (-) DOE negative cardio ROS Normal cardiovascular exam Rhythm:regular Rate:Normal     Neuro/Psych negative neurological ROS  negative psych ROS   GI/Hepatic Neg liver ROS, GERD  Controlled,  Endo/Other  negative endocrine ROS  Renal/GU negative Renal ROS  negative genitourinary   Musculoskeletal   Abdominal   Peds  Hematology negative hematology ROS (+)   Anesthesia Other Findings Past Medical History:   Chronic pancreatitis ([place])                                   Multiple fractures of ribs of right side        06/01/2015     Comment:"5" S/P fall  LEFT SIDE   Concussion                                      06/01/2015     Comment:S/P fall-PT STATES IN PHONE INTERVIEW THAT SHE               IS UNSURE IF SHE HAD A CONCUSSION OR NOT   Lung mass                                                      Comment:right side noted 06/01/2015   Anxiety                                                      GERD (gastroesophageal reflux disease)                       Frequent UTI                                                   Comment:"sometimes" (06/01/2015)   Shortness of breath dyspnea                                    Comment:5 rib fx right side   Lung cancer ([place])                               07/13/15         Comment:METASTATIC ADENOCARCINOMA OF THE  LUNG   Broken  ribs                                     05/2015        Comment:5 ribs  Past Surgical History:   TONSILLECTOMY                                                 MYRINGOTOMY WITH TUBE PLACEMENT                 Bilateral              EYE MUSCLE SURGERY                              Left ~ 1979       ESOPHAGOGASTRODUODENOSCOPY                                    ENDOBRONCHIAL ULTRASOUND                        N/A 07/13/2015       Comment:Procedure: ENDOBRONCHIAL ULTRASOUND;  Surgeon:               Laverle Hobby, MD;  Location: [facility] ORS;               Service: Pulmonary;  Laterality: N/A;     Reproductive/Obstetrics negative OB ROS                             Anesthesia Physical Anesthesia Plan  ASA: III  Anesthesia Plan: General LMA   Post-op Pain Management:    Induction:   Airway Management Planned:   Additional Equipment:   Intra-op Plan:   Post-operative Plan:   Informed Consent: I have reviewed the patients History and Physical, chart, labs and discussed the procedure including the risks, benefits and alternatives for the proposed anesthesia with the patient or authorized representative who has indicated his/her understanding and acceptance.   Dental Advisory Given  Plan Discussed with: Anesthesiologist, CRNA and Surgeon  Anesthesia Plan Comments:         Anesthesia Quick Evaluation

## 2015-07-28 ENCOUNTER — Encounter
Admission: RE | Admit: 2015-07-28 | Discharge: 2015-07-28 | Disposition: A | Discharge status: Home / Self Care | Payer: BLUE CROSS/BLUE SHIELD | Source: Ambulatory Visit | Attending: Internal Medicine | Admitting: Internal Medicine

## 2015-07-28 ENCOUNTER — Ambulatory Visit: Payer: BLUE CROSS/BLUE SHIELD

## 2015-07-28 ENCOUNTER — Encounter: Payer: Self-pay | Admitting: Cardiothoracic Surgery

## 2015-07-28 ENCOUNTER — Ambulatory Visit
Admission: RE | Admit: 2015-07-28 | Discharge: 2015-07-28 | Disposition: A | Discharge status: Home / Self Care | Payer: BLUE CROSS/BLUE SHIELD | Source: Ambulatory Visit | Attending: Internal Medicine | Admitting: Internal Medicine

## 2015-07-28 DIAGNOSIS — C3411 Malignant neoplasm of upper lobe, right bronchus or lung: Secondary | ICD-10-CM | POA: Insufficient documentation

## 2015-07-28 DIAGNOSIS — M47892 Other spondylosis, cervical region: Secondary | ICD-10-CM | POA: Insufficient documentation

## 2015-07-28 DIAGNOSIS — C7931 Secondary malignant neoplasm of brain: Secondary | ICD-10-CM | POA: Insufficient documentation

## 2015-07-28 DIAGNOSIS — I739 Peripheral vascular disease, unspecified: Secondary | ICD-10-CM | POA: Insufficient documentation

## 2015-07-28 LAB — GLUCOSE, CAPILLARY: Glucose-Capillary: 108 mg/dL — ABNORMAL HIGH (ref 65–99)

## 2015-07-28 MED ORDER — FLUDEOXYGLUCOSE F - 18 (FDG) INJECTION
11.7500 | Freq: Once | INTRAVENOUS | Status: AC | PRN
  Administered 2015-07-28: 11.75 via INTRAVENOUS

## 2015-07-28 MED ORDER — GADOBENATE DIMEGLUMINE 529 MG/ML IV SOLN
15.0000 mL | Freq: Once | INTRAVENOUS | Status: AC | PRN
  Administered 2015-07-28: 12 mL via INTRAVENOUS

## 2015-07-30 ENCOUNTER — Inpatient Hospital Stay (HOSPITAL_BASED_OUTPATIENT_CLINIC_OR_DEPARTMENT_OTHER): Payer: BLUE CROSS/BLUE SHIELD | Admitting: Internal Medicine

## 2015-07-30 ENCOUNTER — Ambulatory Visit
Admission: RE | Admit: 2015-07-30 | Discharge: 2015-07-30 | Disposition: A | Discharge status: Home / Self Care | Payer: BLUE CROSS/BLUE SHIELD | Source: Ambulatory Visit | Attending: Radiation Oncology | Admitting: Radiation Oncology

## 2015-07-30 ENCOUNTER — Encounter: Payer: Self-pay | Admitting: Internal Medicine

## 2015-07-30 VITALS — BP 111/77 | HR 98 | Temp 98.2°F | Resp 18 | Ht 62.0 in | Wt 137.8 lb

## 2015-07-30 DIAGNOSIS — R109 Unspecified abdominal pain: Secondary | ICD-10-CM | POA: Diagnosis not present

## 2015-07-30 DIAGNOSIS — K861 Other chronic pancreatitis: Secondary | ICD-10-CM

## 2015-07-30 DIAGNOSIS — Z79899 Other long term (current) drug therapy: Secondary | ICD-10-CM

## 2015-07-30 DIAGNOSIS — Z800 Family history of malignant neoplasm of digestive organs: Secondary | ICD-10-CM | POA: Diagnosis not present

## 2015-07-30 DIAGNOSIS — Z95828 Presence of other vascular implants and grafts: Secondary | ICD-10-CM

## 2015-07-30 DIAGNOSIS — G8918 Other acute postprocedural pain: Secondary | ICD-10-CM

## 2015-07-30 DIAGNOSIS — F419 Anxiety disorder, unspecified: Secondary | ICD-10-CM | POA: Diagnosis not present

## 2015-07-30 DIAGNOSIS — C7931 Secondary malignant neoplasm of brain: Secondary | ICD-10-CM | POA: Diagnosis not present

## 2015-07-30 DIAGNOSIS — Z8781 Personal history of (healed) traumatic fracture: Secondary | ICD-10-CM | POA: Diagnosis not present

## 2015-07-30 DIAGNOSIS — R112 Nausea with vomiting, unspecified: Secondary | ICD-10-CM

## 2015-07-30 DIAGNOSIS — F17210 Nicotine dependence, cigarettes, uncomplicated: Secondary | ICD-10-CM

## 2015-07-30 DIAGNOSIS — C3411 Malignant neoplasm of upper lobe, right bronchus or lung: Secondary | ICD-10-CM

## 2015-07-30 DIAGNOSIS — G8929 Other chronic pain: Secondary | ICD-10-CM

## 2015-07-30 DIAGNOSIS — K219 Gastro-esophageal reflux disease without esophagitis: Secondary | ICD-10-CM | POA: Diagnosis not present

## 2015-07-30 DIAGNOSIS — Z87891 Personal history of nicotine dependence: Secondary | ICD-10-CM | POA: Diagnosis not present

## 2015-07-30 DIAGNOSIS — Z87440 Personal history of urinary (tract) infections: Secondary | ICD-10-CM | POA: Diagnosis not present

## 2015-07-30 DIAGNOSIS — Z510 Encounter for antineoplastic radiation therapy: Secondary | ICD-10-CM | POA: Diagnosis not present

## 2015-07-30 DIAGNOSIS — Z9181 History of falling: Secondary | ICD-10-CM

## 2015-07-30 MED ORDER — PROCHLORPERAZINE MALEATE 10 MG PO TABS
10.0000 mg | ORAL_TABLET | Freq: Four times a day (QID) | ORAL | Status: DC | PRN
Start: 2015-07-30 — End: 2015-11-16

## 2015-07-30 MED ORDER — ONDANSETRON HCL 8 MG PO TABS: 8.0000 mg | ORAL_TABLET | Freq: Three times a day (TID) | ORAL | Status: DC | PRN

## 2015-07-30 MED ORDER — LIDOCAINE-PRILOCAINE 2.5-2.5 % EX CREA: 1.0000 "application " | TOPICAL_CREAM | CUTANEOUS | Status: DC | PRN

## 2015-07-30 NOTE — Progress Notes (Signed)
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 PET- no distant mets; March 1st START CARBO-TAXOL Weekly +RT  # FEB 2017- MRI Brain 48m focus of met [Right frontal]  # "Chronic pancreatitis"/ smoker [15ppt]  HISTORY OF PRESENTING ILLNESS:  Tiffany Erickson 45y.o.  female newly diagnosed adenocarcinoma of the right lung- is here to review the results of her MRI brain/also PET scan.  In the interim patient had a Mediport placed.   Patient admits to mild tenderness at the site of port placement. Otherwise she denies any new symptoms of any unusual cough or hemoptysis. Patient admits to chronic nausea with intermittent vomiting; chronic abdominal pain- this is not any worse.  Patient denies any tingling and numbness of her feet.   No hoarseness of voice. No vision changes or double vision.   ROS: A complete 10 point review of system is done which is negative except mentioned above in history of present illness  MEDICAL HISTORY:  Past Medical History  Diagnosis Date  . [place]   . Multiple fractures of ribs of right side 06/01/2015    "5" S/P fall  LEFT SIDE  . Concussion 06/01/2015    S/P fall-PT STATES IN PHONE INTERVIEW THAT SHE IS UNSURE IF SHE HAD A CONCUSSION OR NOT  . Lung mass     right side noted 06/01/2015  . Anxiety   . GERD (gastroesophageal reflux disease)   . Frequent UTI     "sometimes" (06/01/2015)  . Shortness of breath dyspnea     5 rib fx right side  . [place]    METASTATIC ADENOCARCINOMA OF THE LUNG  . Broken ribs 05/2015    5 ribs    SURGICAL HISTORY: Past Surgical History  Procedure Laterality Date  . Tonsillectomy    . Myringotomy with tube placement Bilateral   . Eye muscle surgery Left ~ 1979  . Esophagogastroduodenoscopy    .  Endobronchial ultrasound N/A 07/13/2015    Procedure: ENDOBRONCHIAL ULTRASOUND;  Surgeon: PLaverle Hobby MD;  Location: [facility] ORS;  Service: Pulmonary;  Laterality: N/A;  . Portacath placement Left 07/27/2015    Procedure: INSERTION PORT-A-CATH;  Surgeon: TNestor Lewandowsky MD;  Location: [facility] ORS;  Service: General;  Laterality: Left;    SOCIAL HISTORY: Social History   Social History  . Marital Status: Single    Spouse Name: N/A  . Number of Children: N/A  . Years of Education: N/A   Occupational History  . Not on file.   Social History Main Topics  . Smoking status: Current Every Day Smoker -- 0.50 packs/day for 28 years    Types: Cigarettes  . Smokeless tobacco: Never Used  . Alcohol Use: 8.4 oz/week    2 Glasses of wine, 12 Cans of beer per week     Comment: 06/01/2015 "drink probably 4 times/wk; either ;4 beers or 2 glasses of wine when I do drink"  . Drug Use: No  . Sexual Activity: Not Currently   Other Topics Concern  . Not on file   Social History Narrative    FAMILY HISTORY: Family History  Problem Relation Age of Onset  . Colon cancer Maternal Grandfather   . CAD Maternal Grandfather   . Pancreatic cancer Maternal Grandmother     ALLERGIES:  is allergic to sulfa  antibiotics.  MEDICATIONS:  Current Outpatient Prescriptions  Medication Sig Dispense Refill  . ALPRAZolam (XANAX) 1 MG tablet Take 0.5-1 mg by mouth 3 (three) times daily as needed for anxiety.     Marland Kitchen HYDROcodone-acetaminophen (NORCO/VICODIN) 5-325 MG tablet Take 2 tablets by mouth 2 (two) times daily. Prn pain  0  . pantoprazole (PROTONIX) 20 MG tablet Take 20 mg by mouth at bedtime.     . promethazine (PHENERGAN) 25 MG tablet Take 25 mg by mouth every 6 (six) hours as needed for nausea or vomiting.    . eszopiclone (LUNESTA) 1 MG TABS tablet Take 1 tablet (1 mg total) by mouth at bedtime as needed for sleep. Take immediately before bedtime (Patient not taking: Reported on 07/30/2015) 30 tablet 1   . lidocaine-prilocaine (EMLA) cream Apply 1 application topically as needed. 30 g 6  . metoCLOPramide (REGLAN) 10 MG tablet Take 1 tablet (10 mg total) by mouth every 8 (eight) hours as needed for nausea or vomiting. (Patient not taking: Reported on 07/30/2015) 10 tablet 0   No current facility-administered medications for this visit.      Marland Kitchen  PHYSICAL EXAMINATION: ECOG PERFORMANCE STATUS: 0 - Asymptomatic  Filed Vitals:   07/30/15 0859  BP: 111/77  Pulse: 98  Temp: 98.2 F (36.8 C)  Resp: 18   Filed Weights   07/30/15 0859  Weight: 137 lb 12.6 oz (62.5 kg)    GENERAL: Well-nourished well-developed; Alert, no distress and comfortable. Accompanied by her aunt.  EYES: no pallor or icterus OROPHARYNX: no thrush or ulceration; good dentition  NECK: supple, no masses felt LYMPH:  no palpable lymphadenopathy in the cervical, axillary or inguinal regions LUNGS: clear to auscultation and  No wheeze or crackles HEART/CVS: regular rate & rhythm and no murmurs; No lower extremity edema ABDOMEN: abdomen soft, non-tender and normal bowel sounds Musculoskeletal:no cyanosis of digits and no clubbing  PSYCH: alert & oriented x 3 with fluent speech; anxious NEURO: no focal motor/sensory deficits SKIN:  no rashes or significant lesions  LABORATORY DATA:  I have reviewed the data as listed Lab Results  Component Value Date   WBC 11.2* 07/23/2015   HGB 16.4* 07/23/2015   HCT 48.9* 07/23/2015   MCV 95.7 07/23/2015   PLT 369 07/23/2015    Recent Labs  02/03/15 0235 06/01/15 0430 06/02/15 0551 07/23/15 1543  NA 136 142 133* 135  K 3.4* 3.7 3.5 3.6  CL 100* 105 97* 101  CO2 23 23 28 27   GLUCOSE 108* 105* 104* 106*  BUN <5* <5* <5* <5*  CREATININE 0.77 0.58 0.57 0.57  CALCIUM 9.2 9.0 8.7* 9.1  GFRNONAA >60 >60 >60 >60  GFRAA >60 >60 >60 >60  PROT 8.2* 7.5  --  7.9  ALBUMIN 4.4 3.7  --  3.9  AST 37 46*  --  21  ALT 25 38  --  26  ALKPHOS 103 129*  --  155*  BILITOT 0.6 0.4   --  0.8    RADIOGRAPHIC STUDIES: I have personally reviewed the radiological images as listed and agreed with the findings in the report. Dg Chest 1 View  07/13/2015  CLINICAL DATA:  Post bronchoscopy, shortness of breath. EXAM: CHEST 1 VIEW COMPARISON:  06/02/2015. FINDINGS: Trachea is midline. Heart size normal. Right upper lobe mass is again seen. No pneumothorax post biopsy. Lungs are otherwise clear. No pleural fluid. IMPRESSION: Right upper lobe mass.  No pneumothorax after bronchoscopic biopsy. Electronically Signed   By: Rip Harbour  Blietz M.D.   On: 07/13/2015 15:56   Mr Jeri Cos AJ Contrast  07/28/2015  CLINICAL DATA:  Stage III lung cancer. No current neurologic symptoms. EXAM: MRI HEAD WITHOUT AND WITH CONTRAST TECHNIQUE: Multiplanar, multiecho pulse sequences of the brain and surrounding structures were obtained without and with intravenous contrast. CONTRAST:  22m MULTIHANCE GADOBENATE DIMEGLUMINE 529 MG/ML IV SOLN COMPARISON:  PET scan 07/28/2015.  CT head 06/01/2015. FINDINGS: No evidence for acute stroke, acute hemorrhage, hydrocephalus, or extra-axial fluid. Normal for age cerebral volume. Mild subcortical and periventricular T2 and FLAIR hyperintensities, likely chronic microvascular ischemic change. Pituitary and cerebellar tonsils are unremarkable. Cervical spondylosis. Low signal intensity bone marrow in the upper cervical region and skull base, negative on PET scan, likely representing anemia or chronic disease. Post infusion, there is a solitary metastasis to the medial inferior RIGHT frontal lobe, roughly spherical, 3 mm in diameter. See axial image 30 series 10. No significant surrounding edema or mass effect. No other definite intra-axial metastases are seen. No upper cervical lesions are appreciated. No leptomeningeal or pachymeningeal enhancement. Extracranial soft tissues unremarkable.  No orbital findings. IMPRESSION: Solitary 3 mm RIGHT frontal lobe metastasis. No significant  surrounding edema or mass effect. Mild small vessel disease. Cervical spondylosis.  No definite osseous metastatic disease. These results will be called to the ordering clinician or representative by the Radiologist Assistant, and communication documented in the PACS or zVision Dashboard. Electronically Signed   By: JStaci RighterM.D.   On: 07/28/2015 16:51   Nm Pet Image Initial (pi) Skull Base To Thigh  07/28/2015  CLINICAL DATA:  Initial treatment strategy for right upper lobe lung mass. Right paratracheal lymph node EBUS guided FNA was positive for metastatic adenocarcinoma on 07/13/2015. EXAM: NUCLEAR MEDICINE PET SKULL BASE TO THIGH TECHNIQUE: 11.8 mCi F-18 FDG was injected intravenously. Full-ring PET imaging was performed from the skull base to thigh after the radiotracer. CT data was obtained and used for attenuation correction and anatomic localization. FASTING BLOOD GLUCOSE:  Value: 108 mg/dl COMPARISON:  06/01/2015 CT chest, abdomen and pelvis. FINDINGS: NECK No hypermetabolic lymph nodes in the neck. CHEST There is a hypermetabolic irregular 5.6 x 4.7 cm apical right upper lobe lung mass (series 3/image 60) with max SUV 13.2, which abuts the anterior and mediastinal visceral pleura. There is a new trace layering right pleural effusion. There is a hypermetabolic 2.1 cm right hilar lymph node (series 3/image 80) with max SUV 7.2. There is a hypermetabolic right paratracheal 2.0 cm lymph node (series 3/image 73) with max SUV 6.4. No hypermetabolic supraclavicular, contralateral mediastinal or contralateral hilar nodes. No hypermetabolic axillary nodes. Left subclavian MediPort terminates at the cavoatrial junction. Low-density 2.2 x 1.6 cm retroareolar left breast lesion (series 3/image 107) is non hypermetabolic and unchanged in size since 06/01/2015. No acute consolidative airspace disease or additional significant pulmonary nodules. ABDOMEN/PELVIS No abnormal hypermetabolic activity within the liver,  pancreas, adrenal glands, or spleen. No hypermetabolic lymph nodes in the abdomen or pelvis. SKELETON No focal hypermetabolic activity to suggest skeletal metastasis. There is expected mild hypermetabolism associated with healing posterior left fourth through ninth rib fractures and healing anterior left fourth through seventh rib fractures (max SUV 3.6). IMPRESSION: 1. Hypermetabolic 5.6 cm apical right upper lobe lung mass, in keeping with a primary bronchogenic carcinoma. This mass abuts the visceral pleura. 2. Hypermetabolic ipsilateral hilar and ipsilateral mediastinal nodal metastases. No additional hypermetabolic metastatic disease. 3. PET-CT staging is at least stage IIIA (T2b N2 M0). Note is  made of a new trace layering right pleural effusion, which is indeterminate for a malignant effusion. 4. Expected mild hypermetabolism associated with healing left rib fractures. Electronically Signed   By: Ilona Sorrel M.D.   On: 07/28/2015 15:05   Dg Chest Port 1 View  07/27/2015  CLINICAL DATA:  Left subclavian Port-A-Cath insertion. Right upper lobe lung cancer. EXAM: PORTABLE CHEST 1 VIEW COMPARISON:  07/13/2015 and earlier. FINDINGS: Left subclavian Port-A-Cath tip projects over the lower SVC near the cavoatrial junction. No evidence of pneumothorax or mediastinal hematoma. Right upper lobe lung mass as on prior examinations. No new pulmonary parenchymal abnormalities. IMPRESSION: 1. Left subclavian Port-A-Cath tip projects over the lower SVC. No acute complicating features. 2. Right upper lobe lung mass as noted previously. No new/acute cardiopulmonary disease. Electronically Signed   By: Evangeline Dakin M.D.   On: 07/27/2015 17:08   Dg C-arm 1-60 Min-no Report  07/27/2015  CLINICAL DATA: port a cath insertion C-ARM 1-60 MINUTES Fluoroscopy was utilized by the requesting physician.  No radiographic interpretation.   Dg C-arm 1-60 Min-no Report  07/13/2015  CLINICAL DATA: lung mass C-ARM 1-60 MINUTES  Fluoroscopy was utilized by the requesting physician.  No radiographic interpretation.    ASSESSMENT & PLAN:    # STAGE IV Ch[facility] May focus of brain met] Adenocarcinoma T2N2; right upper lobe- I reviewed the the stage/imaging with the patient and aunt in detail. Recommend carbotaxol weekly along with radiation. I also discussed the patient will need consolidation chemotherapy with carbotaxol 2 after finishing weekly chemoradiation.  # I again discussed the potential side effects including but not limited to-increasing fatigue, nausea vomiting, diarrhea, hair loss, sores in the mouth, increase risk of infection and also neuropathy.   # 3 mm right frontal enhancing lesion/ metastatic lesion- recommend SB RT. A neurosurgical evaluation was discussed however in the context of her immediate need for treatment of her chest/lung cancer-I think radiation is reasonable as an option.  # The above plan of care was discussed with Dr. Crystal/Dr. Faith Rogue.   # Patient will likely start radiation therapy latter next week/ I will see the patient prior to chemotherapy. She will also need chemotherapy education. I reviewed the images myself/reviewed the images with the patient and aunt.   # 25  minutes face-to-face with the patient discussing the above plan of care; more than 50% of time spent on prognosis/ natural history; counseling and coordination.     Cammie Sickle, MD 07/30/2015 10:21 AM

## 2015-07-30 NOTE — Patient Instructions (Addendum)
Carboplatin injection What is this medicine? CARBOPLATIN (KAR boe pla tin) is a chemotherapy drug. It targets fast dividing cells, like cancer cells, and causes these cells to die. This medicine is used to treat ovarian cancer and many other cancers. This medicine may be used for other purposes; ask your health care provider or pharmacist if you have questions. What should I tell my health care provider before I take this medicine? They need to know if you have any of these conditions: -blood disorders -hearing problems -kidney disease -recent or ongoing radiation therapy -an unusual or allergic reaction to carboplatin, cisplatin, other chemotherapy, other medicines, foods, dyes, or preservatives -pregnant or trying to get pregnant -breast-feeding How should I use this medicine? This drug is usually given as an infusion into a vein. It is administered in a hospital or clinic by a specially trained health care professional. Talk to your pediatrician regarding the use of this medicine in children. Special care may be needed. Overdosage: If you think you have taken too much of this medicine contact a poison control center or emergency room at once. NOTE: This medicine is only for you. Do not share this medicine with others. What if I miss a dose? It is important not to miss a dose. Call your doctor or health care professional if you are unable to keep an appointment. What may interact with this medicine? -medicines for seizures -medicines to increase blood counts like filgrastim, pegfilgrastim, sargramostim -some antibiotics like amikacin, gentamicin, neomycin, streptomycin, tobramycin -vaccines Talk to your doctor or health care professional before taking any of these medicines: -acetaminophen -aspirin -ibuprofen -ketoprofen -naproxen This list may not describe all possible interactions. Give your health care provider a list of all the medicines, herbs, non-prescription drugs, or  dietary supplements you use. Also tell them if you smoke, drink alcohol, or use illegal drugs. Some items may interact with your medicine. What should I watch for while using this medicine? Your condition will be monitored carefully while you are receiving this medicine. You will need important blood work done while you are taking this medicine. This drug may make you feel generally unwell. This is not uncommon, as chemotherapy can affect healthy cells as well as cancer cells. Report any side effects. Continue your course of treatment even though you feel ill unless your doctor tells you to stop. In some cases, you may be given additional medicines to help with side effects. Follow all directions for their use. Call your doctor or health care professional for advice if you get a fever, chills or sore throat, or other symptoms of a cold or flu. Do not treat yourself. This drug decreases your body's ability to fight infections. Try to avoid being around people who are sick. This medicine may increase your risk to bruise or bleed. Call your doctor or health care professional if you notice any unusual bleeding. Be careful brushing and flossing your teeth or using a toothpick because you may get an infection or bleed more easily. If you have any dental work done, tell your dentist you are receiving this medicine. Avoid taking products that contain aspirin, acetaminophen, ibuprofen, naproxen, or ketoprofen unless instructed by your doctor. These medicines may hide a fever. Do not become pregnant while taking this medicine. Women should inform their doctor if they wish to become pregnant or think they might be pregnant. There is a potential for serious side effects to an unborn child. Talk to your health care professional or pharmacist for more information.  Do not breast-feed an infant while taking this medicine. What side effects may I notice from receiving this medicine? Side effects that you should report to  your doctor or health care professional as soon as possible: -allergic reactions like skin rash, itching or hives, swelling of the face, lips, or tongue -signs of infection - fever or chills, cough, sore throat, pain or difficulty passing urine -signs of decreased platelets or bleeding - bruising, pinpoint red spots on the skin, black, tarry stools, nosebleeds -signs of decreased red blood cells - unusually weak or tired, fainting spells, lightheadedness -breathing problems -changes in hearing -changes in vision -chest pain -high blood pressure -low blood counts - This drug may decrease the number of white blood cells, red blood cells and platelets. You may be at increased risk for infections and bleeding. -nausea and vomiting -pain, swelling, redness or irritation at the injection site -pain, tingling, numbness in the hands or feet -problems with balance, talking, walking -trouble passing urine or change in the amount of urine Side effects that usually do not require medical attention (report to your doctor or health care professional if they continue or are bothersome): -hair loss -loss of appetite -metallic taste in the mouth or changes in taste This list may not describe all possible side effects. Call your doctor for medical advice about side effects. You may report side effects to FDA at 1-800-FDA-1088. Where should I keep my medicine? This drug is given in a hospital or clinic and will not be stored at home. NOTE: This sheet is a summary. It may not cover all possible information. If you have questions about this medicine, talk to your doctor, pharmacist, or health care provider.    2016, Elsevier/Gold Standard. (2007-08-28 14:38:05)    Paclitaxel injection What is this medicine? PACLITAXEL (PAK li TAX el) is a chemotherapy drug. It targets fast dividing cells, like cancer cells, and causes these cells to die. This medicine is used to treat ovarian cancer, breast cancer, and  other cancers. This medicine may be used for other purposes; ask your health care provider or pharmacist if you have questions. What should I tell my health care provider before I take this medicine? They need to know if you have any of these conditions: -blood disorders -irregular heartbeat -infection (especially a virus infection such as chickenpox, cold sores, or herpes) -liver disease -previous or ongoing radiation therapy -an unusual or allergic reaction to paclitaxel, alcohol, polyoxyethylated castor oil, other chemotherapy agents, other medicines, foods, dyes, or preservatives -pregnant or trying to get pregnant -breast-feeding How should I use this medicine? This drug is given as an infusion into a vein. It is administered in a hospital or clinic by a specially trained health care professional. Talk to your pediatrician regarding the use of this medicine in children. Special care may be needed. Overdosage: If you think you have taken too much of this medicine contact a poison control center or emergency room at once. NOTE: This medicine is only for you. Do not share this medicine with others. What if I miss a dose? It is important not to miss your dose. Call your doctor or health care professional if you are unable to keep an appointment. What may interact with this medicine? Do not take this medicine with any of the following medications: -disulfiram -metronidazole This medicine may also interact with the following medications: -cyclosporine -diazepam -ketoconazole -medicines to increase blood counts like filgrastim, pegfilgrastim, sargramostim -other chemotherapy drugs like cisplatin, doxorubicin, epirubicin, etoposide, teniposide,  vincristine -quinidine -testosterone -vaccines -verapamil Talk to your doctor or health care professional before taking any of these medicines: -acetaminophen -aspirin -ibuprofen -ketoprofen -naproxen This list may not describe all possible  interactions. Give your health care provider a list of all the medicines, herbs, non-prescription drugs, or dietary supplements you use. Also tell them if you smoke, drink alcohol, or use illegal drugs. Some items may interact with your medicine. What should I watch for while using this medicine? Your condition will be monitored carefully while you are receiving this medicine. You will need important blood work done while you are taking this medicine. This drug may make you feel generally unwell. This is not uncommon, as chemotherapy can affect healthy cells as well as cancer cells. Report any side effects. Continue your course of treatment even though you feel ill unless your doctor tells you to stop. This medicine can cause serious allergic reactions. To reduce your risk you will need to take other medicine(s) before treatment with this medicine. In some cases, you may be given additional medicines to help with side effects. Follow all directions for their use. Call your doctor or health care professional for advice if you get a fever, chills or sore throat, or other symptoms of a cold or flu. Do not treat yourself. This drug decreases your body's ability to fight infections. Try to avoid being around people who are sick. This medicine may increase your risk to bruise or bleed. Call your doctor or health care professional if you notice any unusual bleeding. Be careful brushing and flossing your teeth or using a toothpick because you may get an infection or bleed more easily. If you have any dental work done, tell your dentist you are receiving this medicine. Avoid taking products that contain aspirin, acetaminophen, ibuprofen, naproxen, or ketoprofen unless instructed by your doctor. These medicines may hide a fever. Do not become pregnant while taking this medicine. Women should inform their doctor if they wish to become pregnant or think they might be pregnant. There is a potential for serious side  effects to an unborn child. Talk to your health care professional or pharmacist for more information. Do not breast-feed an infant while taking this medicine. Men are advised not to father a child while receiving this medicine. This product may contain alcohol. Ask your pharmacist or healthcare provider if this medicine contains alcohol. Be sure to tell all healthcare providers you are taking this medicine. Certain medicines, like metronidazole and disulfiram, can cause an unpleasant reaction when taken with alcohol. The reaction includes flushing, headache, nausea, vomiting, sweating, and increased thirst. The reaction can last from 30 minutes to several hours. What side effects may I notice from receiving this medicine? Side effects that you should report to your doctor or health care professional as soon as possible: -allergic reactions like skin rash, itching or hives, swelling of the face, lips, or tongue -low blood counts - This drug may decrease the number of white blood cells, red blood cells and platelets. You may be at increased risk for infections and bleeding. -signs of infection - fever or chills, cough, sore throat, pain or difficulty passing urine -signs of decreased platelets or bleeding - bruising, pinpoint red spots on the skin, black, tarry stools, nosebleeds -signs of decreased red blood cells - unusually weak or tired, fainting spells, lightheadedness -breathing problems -chest pain -high or low blood pressure -mouth sores -nausea and vomiting -pain, swelling, redness or irritation at the injection site -pain, tingling, numbness in  the hands or feet -slow or irregular heartbeat -swelling of the ankle, feet, hands Side effects that usually do not require medical attention (report to your doctor or health care professional if they continue or are bothersome): -bone pain -complete hair loss including hair on your head, underarms, pubic hair, eyebrows, and eyelashes -changes in  the color of fingernails -diarrhea -loosening of the fingernails -loss of appetite -muscle or joint pain -red flush to skin -sweating This list may not describe all possible side effects. Call your doctor for medical advice about side effects. You may report side effects to FDA at 1-800-FDA-1088. Where should I keep my medicine? This drug is given in a hospital or clinic and will not be stored at home. NOTE: This sheet is a summary. It may not cover all possible information. If you have questions about this medicine, talk to your doctor, pharmacist, or health care provider.    2016, Elsevier/Gold Standard. (2015-01-08 13:02:56)        Steps to Quit Smoking  Smoking tobacco can be harmful to your health and can affect almost every organ in your body. Smoking puts you, and those around you, at risk for developing many serious chronic diseases. Quitting smoking is difficult, but it is one of the best things that you can do for your health. It is never too late to quit. WHAT ARE THE BENEFITS OF QUITTING SMOKING? When you quit smoking, you lower your risk of developing serious diseases and conditions, such as:  Lung cancer or lung disease, such as COPD.  Heart disease.  Stroke.  Heart attack.  Infertility.  Osteoporosis and bone fractures. Additionally, symptoms such as coughing, wheezing, and shortness of breath may get better when you quit. You may also find that you get sick less often because your body is stronger at fighting off colds and infections. If you are pregnant, quitting smoking can help to reduce your chances of having a baby of low birth weight. HOW DO I GET READY TO QUIT? When you decide to quit smoking, create a plan to make sure that you are successful. Before you quit:  Pick a date to quit. Set a date within the next two weeks to give you time to prepare.  Write down the reasons why you are quitting. Keep this list in places where you will see it often, such  as on your bathroom mirror or in your car or wallet.  Identify the people, places, things, and activities that make you want to smoke (triggers) and avoid them. Make sure to take these actions:  Throw away all cigarettes at home, at work, and in your car.  Throw away smoking accessories, such as Scientist, research (medical).  Clean your car and make sure to empty the ashtray.  Clean your home, including curtains and carpets.  Tell your family, friends, and coworkers that you are quitting. Support from your loved ones can make quitting easier.  Talk with your health care provider about your options for quitting smoking.  Find out what treatment options are covered by your health insurance. WHAT STRATEGIES CAN I USE TO QUIT SMOKING?  Talk with your healthcare provider about different strategies to quit smoking. Some strategies include:  Quitting smoking altogether instead of gradually lessening how much you smoke over a period of time. Research shows that quitting "cold [place]" is more successful than gradually quitting.  Attending in-person counseling to help you build problem-solving skills. You are more likely to have success in quitting if you  attend several counseling sessions. Even short sessions of 10 minutes can be effective.  Finding resources and support systems that can help you to quit smoking and remain smoke-free after you quit. These resources are most helpful when you use them often. They can include:  Online chats with a Social worker.  Telephone quitlines.  Printed Furniture conservator/restorer.  Support groups or group counseling.  Text messaging programs.  Mobile phone applications.  Taking medicines to help you quit smoking. (If you are pregnant or breastfeeding, talk with your health care provider first.) Some medicines contain nicotine and some do not. Both types of medicines help with cravings, but the medicines that include nicotine help to relieve withdrawal symptoms. Your  health care provider may recommend:  Nicotine patches, gum, or lozenges.  Nicotine inhalers or sprays.  Non-nicotine medicine that is taken by mouth. Talk with your health care provider about combining strategies, such as taking medicines while you are also receiving in-person counseling. Using these two strategies together makes you more likely to succeed in quitting than if you used either strategy on its own. If you are pregnant or breastfeeding, talk with your health care provider about finding counseling or other support strategies to quit smoking. Do not take medicine to help you quit smoking unless told to do so by your health care provider. WHAT THINGS CAN I DO TO MAKE IT EASIER TO QUIT? Quitting smoking might feel overwhelming at first, but there is a lot that you can do to make it easier. Take these important actions:  Reach out to your family and friends and ask that they support and encourage you during this time. Call telephone quitlines, reach out to support groups, or work with a counselor for support.  Ask people who smoke to avoid smoking around you.  Avoid places that trigger you to smoke, such as bars, parties, or smoke-break areas at work.  Spend time around people who do not smoke.  Lessen stress in your life, because stress can be a smoking trigger for some people. To lessen stress, try:  Exercising regularly.  Deep-breathing exercises.  Yoga.  Meditating.  Performing a body scan. This involves closing your eyes, scanning your body from head to toe, and noticing which parts of your body are particularly tense. Purposefully relax the muscles in those areas.  Download or purchase mobile phone or tablet apps (applications) that can help you stick to your quit plan by providing reminders, tips, and encouragement. There are many free apps, such as QuitGuide from the [employer] Office manager for Disease Control and Prevention). You can find other support for quitting smoking  (smoking cessation) through smokefree.gov and other websites. HOW WILL I FEEL WHEN I QUIT SMOKING? Within the first 24 hours of quitting smoking, you may start to feel some withdrawal symptoms. These symptoms are usually most noticeable 2-3 days after quitting, but they usually do not last beyond 2-3 weeks. Changes or symptoms that you might experience include:  Mood swings.  Restlessness, anxiety, or irritation.  Difficulty concentrating.  Dizziness.  Strong cravings for sugary foods in addition to nicotine.  Mild weight gain.  Constipation.  Nausea.  Coughing or a sore throat.  Changes in how your medicines work in your body.  A depressed mood.  Difficulty sleeping (insomnia). After the first 2-3 weeks of quitting, you may start to notice more positive results, such as:  Improved sense of smell and taste.  Decreased coughing and sore throat.  Slower heart rate.  Lower blood pressure.  Clearer skin.  The ability to breathe more easily.  Fewer sick days. Quitting smoking is very challenging for most people. Do not get discouraged if you are not successful the first time. Some people need to make many attempts to quit before they achieve long-term success. Do your best to stick to your quit plan, and talk with your health care provider if you have any questions or concerns.   This information is not intended to replace advice given to you by your health care provider. Make sure you discuss any questions you have with your health care provider.   Document Released: 05/17/2001 Document Revised: 10/07/2014 Document Reviewed: 10/07/2014 Elsevier Interactive Patient Education [employer].

## 2015-07-31 ENCOUNTER — Telehealth: Payer: Self-pay | Admitting: *Deleted

## 2015-07-31 DIAGNOSIS — C3411 Malignant neoplasm of upper lobe, right bronchus or lung: Secondary | ICD-10-CM

## 2015-07-31 DIAGNOSIS — Z95828 Presence of other vascular implants and grafts: Secondary | ICD-10-CM

## 2015-07-31 MED ORDER — HYDROCODONE-ACETAMINOPHEN 5-325 MG PO TABS: ORAL_TABLET | ORAL | Status: DC

## 2015-07-31 NOTE — Telephone Encounter (Signed)
Informed that prescription is ready to pick up  

## 2015-07-31 NOTE — Patient Instructions (Signed)

## 2015-08-04 ENCOUNTER — Ambulatory Visit (HOSPITAL_COMMUNITY): Payer: BLUE CROSS/BLUE SHIELD

## 2015-08-06 ENCOUNTER — Telehealth: Payer: Self-pay | Admitting: *Deleted

## 2015-08-06 ENCOUNTER — Inpatient Hospital Stay: Payer: BLUE CROSS/BLUE SHIELD | Attending: Internal Medicine

## 2015-08-06 DIAGNOSIS — K219 Gastro-esophageal reflux disease without esophagitis: Secondary | ICD-10-CM | POA: Insufficient documentation

## 2015-08-06 DIAGNOSIS — C3411 Malignant neoplasm of upper lobe, right bronchus or lung: Secondary | ICD-10-CM | POA: Insufficient documentation

## 2015-08-06 DIAGNOSIS — C771 Secondary and unspecified malignant neoplasm of intrathoracic lymph nodes: Secondary | ICD-10-CM | POA: Insufficient documentation

## 2015-08-06 DIAGNOSIS — R630 Anorexia: Secondary | ICD-10-CM | POA: Insufficient documentation

## 2015-08-06 DIAGNOSIS — K861 Other chronic pancreatitis: Secondary | ICD-10-CM | POA: Insufficient documentation

## 2015-08-06 DIAGNOSIS — R112 Nausea with vomiting, unspecified: Secondary | ICD-10-CM | POA: Insufficient documentation

## 2015-08-06 DIAGNOSIS — C7931 Secondary malignant neoplasm of brain: Secondary | ICD-10-CM | POA: Insufficient documentation

## 2015-08-06 DIAGNOSIS — F17210 Nicotine dependence, cigarettes, uncomplicated: Secondary | ICD-10-CM | POA: Insufficient documentation

## 2015-08-06 DIAGNOSIS — G2581 Restless legs syndrome: Secondary | ICD-10-CM | POA: Insufficient documentation

## 2015-08-06 DIAGNOSIS — Z95828 Presence of other vascular implants and grafts: Secondary | ICD-10-CM

## 2015-08-06 DIAGNOSIS — Z5111 Encounter for antineoplastic chemotherapy: Secondary | ICD-10-CM | POA: Insufficient documentation

## 2015-08-06 DIAGNOSIS — R1013 Epigastric pain: Secondary | ICD-10-CM | POA: Insufficient documentation

## 2015-08-06 DIAGNOSIS — Z800 Family history of malignant neoplasm of digestive organs: Secondary | ICD-10-CM | POA: Insufficient documentation

## 2015-08-06 DIAGNOSIS — Z4802 Encounter for removal of sutures: Secondary | ICD-10-CM

## 2015-08-06 DIAGNOSIS — Z79899 Other long term (current) drug therapy: Secondary | ICD-10-CM | POA: Insufficient documentation

## 2015-08-06 DIAGNOSIS — D72819 Decreased white blood cell count, unspecified: Secondary | ICD-10-CM | POA: Insufficient documentation

## 2015-08-06 NOTE — Addendum Note (Signed)
Addended by: Sabino Gasser on: 08/06/2015 04:12 PM   Modules accepted: Orders

## 2015-08-06 NOTE — Telephone Encounter (Signed)
Forgot to come up to have her suture removed after chemo class this morning and is asking if she should use EMLA cream tomorrow before she comes in

## 2015-08-06 NOTE — Telephone Encounter (Signed)
I called pt back and left a vm. The patient has an appointment to lab/md/chemotherapy tommororw.  The chemotherapy nurse can remove the sutures at that time. She can apply her emla cream to the port a cath site 1-2 hours prior to port access. I explained this to her on my voice mail and asked the patient to call cancer center back for further questions or concerns.

## 2015-08-07 ENCOUNTER — Inpatient Hospital Stay: Payer: BLUE CROSS/BLUE SHIELD

## 2015-08-07 ENCOUNTER — Inpatient Hospital Stay (HOSPITAL_BASED_OUTPATIENT_CLINIC_OR_DEPARTMENT_OTHER): Payer: BLUE CROSS/BLUE SHIELD | Admitting: Internal Medicine

## 2015-08-07 ENCOUNTER — Encounter: Payer: Self-pay | Admitting: Internal Medicine

## 2015-08-07 VITALS — BP 100/66 | HR 89 | Resp 18

## 2015-08-07 VITALS — BP 107/74 | HR 93 | Temp 98.2°F | Resp 18 | Ht 62.0 in | Wt 135.8 lb

## 2015-08-07 DIAGNOSIS — R112 Nausea with vomiting, unspecified: Secondary | ICD-10-CM

## 2015-08-07 DIAGNOSIS — C3411 Malignant neoplasm of upper lobe, right bronchus or lung: Secondary | ICD-10-CM

## 2015-08-07 DIAGNOSIS — K861 Other chronic pancreatitis: Secondary | ICD-10-CM | POA: Diagnosis not present

## 2015-08-07 DIAGNOSIS — C7931 Secondary malignant neoplasm of brain: Secondary | ICD-10-CM | POA: Diagnosis not present

## 2015-08-07 DIAGNOSIS — Z5111 Encounter for antineoplastic chemotherapy: Secondary | ICD-10-CM | POA: Diagnosis not present

## 2015-08-07 DIAGNOSIS — D72819 Decreased white blood cell count, unspecified: Secondary | ICD-10-CM | POA: Diagnosis not present

## 2015-08-07 DIAGNOSIS — G2581 Restless legs syndrome: Secondary | ICD-10-CM | POA: Diagnosis not present

## 2015-08-07 DIAGNOSIS — C771 Secondary and unspecified malignant neoplasm of intrathoracic lymph nodes: Secondary | ICD-10-CM

## 2015-08-07 DIAGNOSIS — K219 Gastro-esophageal reflux disease without esophagitis: Secondary | ICD-10-CM | POA: Diagnosis not present

## 2015-08-07 DIAGNOSIS — F17210 Nicotine dependence, cigarettes, uncomplicated: Secondary | ICD-10-CM | POA: Diagnosis not present

## 2015-08-07 DIAGNOSIS — R630 Anorexia: Secondary | ICD-10-CM | POA: Diagnosis not present

## 2015-08-07 DIAGNOSIS — Z800 Family history of malignant neoplasm of digestive organs: Secondary | ICD-10-CM | POA: Diagnosis not present

## 2015-08-07 DIAGNOSIS — Z79899 Other long term (current) drug therapy: Secondary | ICD-10-CM | POA: Diagnosis not present

## 2015-08-07 DIAGNOSIS — R1013 Epigastric pain: Secondary | ICD-10-CM | POA: Diagnosis not present

## 2015-08-07 DIAGNOSIS — Z95828 Presence of other vascular implants and grafts: Secondary | ICD-10-CM

## 2015-08-07 LAB — COMPREHENSIVE METABOLIC PANEL
ALBUMIN: 3.5 g/dL (ref 3.5–5.0)
ALK PHOS: 122 U/L (ref 38–126)
ALT: 13 U/L — AB (ref 14–54)
ANION GAP: 7 (ref 5–15)
AST: 21 U/L (ref 15–41)
BUN: <5 mg/dL — ABNORMAL LOW (ref 6–20)
CALCIUM: 8.5 mg/dL — AB (ref 8.9–10.3)
CO2: 25 mmol/L (ref 22–32)
Chloride: 102 mmol/L (ref 101–111)
Creatinine, Ser: 0.56 mg/dL (ref 0.44–1.00)
GFR calc Af Amer: >60 mL/min (ref >60)
GFR calc non Af Amer: >60 mL/min (ref >60)
GLUCOSE: 120 mg/dL — AB (ref 65–99)
Potassium: 3.4 mmol/L — ABNORMAL LOW (ref 3.5–5.1)
SODIUM: 134 mmol/L — AB (ref 135–145)
Total Bilirubin: 0.4 mg/dL (ref 0.3–1.2)
Total Protein: 7.2 g/dL (ref 6.5–8.1)

## 2015-08-07 LAB — CBC WITH DIFFERENTIAL/PLATELET
BASOS ABS: 0.1 10*3/uL (ref 0–0.1)
BASOS PCT: 1 %
EOS ABS: 0.4 10*3/uL (ref 0–0.7)
Eosinophils Relative: 4 %
HCT: 44.9 % (ref 35.0–47.0)
HEMOGLOBIN: 15.3 g/dL (ref 12.0–16.0)
Lymphocytes Relative: 22 %
Lymphs Abs: 2.1 10*3/uL (ref 1.0–3.6)
MCH: 33.2 pg (ref 26.0–34.0)
MCHC: 34.2 g/dL (ref 32.0–36.0)
MCV: 97.1 fL (ref 80.0–100.0)
Monocytes Absolute: 0.9 10*3/uL (ref 0.2–0.9)
Monocytes Relative: 9 %
NEUTROS PCT: 66 %
Neutro Abs: 6.5 10*3/uL (ref 1.4–6.5)
Platelets: 362 10*3/uL (ref 150–440)
RBC: 4.62 MIL/uL (ref 3.80–5.20)
RDW: 16.6 % — ABNORMAL HIGH (ref 11.5–14.5)
WBC: 10 10*3/uL (ref 3.6–11.0)

## 2015-08-07 MED ORDER — FAMOTIDINE IN NACL 20-0.9 MG/50ML-% IV SOLN
20.0000 mg | Freq: Once | INTRAVENOUS | Status: AC
  Administered 2015-08-07: 20 mg via INTRAVENOUS
  Filled 2015-08-07: qty 50

## 2015-08-07 MED ORDER — SODIUM CHLORIDE 0.9% FLUSH
10.0000 mL | INTRAVENOUS | Status: DC | PRN
  Administered 2015-08-07: 10 mL
  Filled 2015-08-07: qty 10

## 2015-08-07 MED ORDER — PALONOSETRON HCL INJECTION 0.25 MG/5ML
0.2500 mg | Freq: Once | INTRAVENOUS | Status: AC
  Administered 2015-08-07: 0.25 mg via INTRAVENOUS
  Filled 2015-08-07: qty 5

## 2015-08-07 MED ORDER — SODIUM CHLORIDE 0.9 % IV SOLN
Freq: Once | INTRAVENOUS | Status: AC
  Administered 2015-08-07: 09:00:00 via INTRAVENOUS
  Filled 2015-08-07: qty 1000

## 2015-08-07 MED ORDER — DEXAMETHASONE SODIUM PHOSPHATE 100 MG/10ML IJ SOLN
20.0000 mg | Freq: Once | INTRAMUSCULAR | Status: AC
  Administered 2015-08-07: 20 mg via INTRAVENOUS
  Filled 2015-08-07: qty 2

## 2015-08-07 MED ORDER — HEPARIN SOD (PORK) LOCK FLUSH 100 UNIT/ML IV SOLN
500.0000 [IU] | Freq: Once | INTRAVENOUS | Status: AC | PRN
  Administered 2015-08-07: 500 [IU]
  Filled 2015-08-07: qty 5

## 2015-08-07 MED ORDER — DIPHENHYDRAMINE HCL 50 MG/ML IJ SOLN
50.0000 mg | Freq: Once | INTRAMUSCULAR | Status: AC
  Administered 2015-08-07: 50 mg via INTRAVENOUS
  Filled 2015-08-07: qty 1

## 2015-08-07 MED ORDER — CARBOPLATIN CHEMO INJECTION 450 MG/45ML
229.0000 mg | Freq: Once | INTRAVENOUS | Status: AC
  Administered 2015-08-07: 230 mg via INTRAVENOUS
  Filled 2015-08-07: qty 23

## 2015-08-07 MED ORDER — PACLITAXEL CHEMO INJECTION 300 MG/50ML: 45.0000 mg/m2 | Freq: Once | INTRAVENOUS | Status: DC

## 2015-08-07 MED ORDER — SODIUM CHLORIDE 0.9 % IV SOLN
45.0000 mg/m2 | Freq: Once | INTRAVENOUS | Status: AC
  Administered 2015-08-07: 72 mg via INTRAVENOUS
  Filled 2015-08-07: qty 12

## 2015-08-07 NOTE — Progress Notes (Signed)
Pt here for first chemo today and she is nervous. She does not have appetite-she does not force herself to eat because if she does she will vomit.  Pt has some questions for MD when he gets in from chemo standpoint.  Her port hurts her in center of chest and her doctor yest. Removed sutures

## 2015-08-07 NOTE — Progress Notes (Signed)
[facility] CONSULT NOTE  Patient Care Team: Pcp Not In System as PCP - General  CHIEF COMPLAINTS/PURPOSE OF CONSULTATION:   # FEB 2017- ADENOCARCINOMA RUL [T2N2; STAGE III  Bronch/FNA- Right paratracheal LN;Dr.Ram]. PET- no distant mets; March 3rd START CARBO-TAXOL Weekly +RT  # FEB 2017- MRI Brain 57m focus of met [Right frontal]  # "Chronic pancreatitis"/ smoker [15ppt]  HISTORY OF PRESENTING ILLNESS:  Tiffany Erickson 45y.o.  female newly diagnosed adenocarcinoma of the right lung- to proceed with cycle #1 of carbotaxol along with radiation.   Patient has chronic nausea from her history from her history of pancreatitis and this is not any worse.  She denies any headaches. No swelling in the legs or arms. No hoarseness of voice. No vision changes or double vision.   ROS: A complete 10 point review of system is done which is negative except mentioned above in history of present illness  MEDICAL HISTORY:  Past Medical History  Diagnosis Date  . [place]   . Multiple fractures of ribs of right side 06/01/2015    "5" S/P fall  LEFT SIDE  . Concussion 06/01/2015    S/P fall-PT STATES IN PHONE INTERVIEW THAT SHE IS UNSURE IF SHE HAD A CONCUSSION OR NOT  . Lung mass     right side noted 06/01/2015  . Anxiety   . GERD (gastroesophageal reflux disease)   . Frequent UTI     "sometimes" (06/01/2015)  . Shortness of breath dyspnea     5 rib fx right side  . Lung cancer [place] 07/13/15    METASTATIC ADENOCARCINOMA OF THE LUNG  . Broken ribs 05/2015    5 ribs    SURGICAL HISTORY: Past Surgical History  Procedure Laterality Date  . Tonsillectomy    . Myringotomy with tube placement Bilateral   . Eye muscle surgery Left ~ 1979  . Esophagogastroduodenoscopy    . Endobronchial ultrasound N/A 07/13/2015    Procedure: ENDOBRONCHIAL ULTRASOUND;  Surgeon: PLaverle Hobby MD;  Location: [facility] ORS;  Service: Pulmonary;  Laterality: N/A;  . Portacath  placement Left 07/27/2015    Procedure: INSERTION PORT-A-CATH;  Surgeon: TNestor Lewandowsky MD;  Location: [facility] ORS;  Service: General;  Laterality: Left;    SOCIAL HISTORY: Social History   Social History  . Marital Status: Single    Spouse Name: N/A  . Number of Children: N/A  . Years of Education: N/A   Occupational History  . Not on file.   Social History Main Topics  . Smoking status: Current Every Day Smoker -- 0.50 packs/day for 28 years    Types: Cigarettes  . Smokeless tobacco: Never Used  . Alcohol Use: 8.4 oz/week    2 Glasses of wine, 12 Cans of beer per week     Comment: 06/01/2015 "drink probably 4 times/wk; either ;4 beers or 2 glasses of wine when I do drink"  . Drug Use: No  . Sexual Activity: Not Currently   Other Topics Concern  . Not on file   Social History Narrative    FAMILY HISTORY: Family History  Problem Relation Age of Onset  . Colon cancer Maternal Grandfather   . CAD Maternal Grandfather   . Pancreatic cancer Maternal Grandmother     ALLERGIES:  is allergic to sulfa antibiotics.  MEDICATIONS:  Current Outpatient Prescriptions  Medication Sig Dispense Refill  . ALPRAZolam (XANAX) 1 MG tablet Take 0.5-1 mg by mouth 3 (three) times daily as needed for anxiety.     .Marland Kitchen  eszopiclone (LUNESTA) 1 MG TABS tablet Take 1 tablet (1 mg total) by mouth at bedtime as needed for sleep. Take immediately before bedtime 30 tablet 1  . HYDROcodone-acetaminophen (NORCO/VICODIN) 5-325 MG tablet One tablet every 6 - 8 hours as needed for pain 30 tablet 0  . lidocaine-prilocaine (EMLA) cream Apply 1 application topically as needed. 30 g 6  . metoCLOPramide (REGLAN) 10 MG tablet Take 1 tablet (10 mg total) by mouth every 8 (eight) hours as needed for nausea or vomiting. 10 tablet 0  . ondansetron (ZOFRAN) 8 MG tablet Take 1 tablet (8 mg total) by mouth every 8 (eight) hours as needed for nausea or vomiting (start 3 days; after chemo). 40 tablet 0  . pantoprazole  (PROTONIX) 20 MG tablet Take 20 mg by mouth at bedtime.     . prochlorperazine (COMPAZINE) 10 MG tablet Take 1 tablet (10 mg total) by mouth every 6 (six) hours as needed for nausea or vomiting. 40 tablet 0  . promethazine (PHENERGAN) 25 MG tablet Take 25 mg by mouth every 6 (six) hours as needed for nausea or vomiting.     No current facility-administered medications for this visit.      Marland Kitchen  PHYSICAL EXAMINATION: ECOG PERFORMANCE STATUS: 0 - Asymptomatic  Filed Vitals:   08/07/15 0856  BP: 107/74  Pulse: 93  Temp: 98.2 F (36.8 C)  Resp: 18   Filed Weights   08/07/15 0856  Weight: 135 lb 12.9 oz (61.6 kg)    GENERAL: Well-nourished well-developed; Alert, no distress and comfortable. She is alone.  EYES: no pallor or icterus OROPHARYNX: no thrush or ulceration; good dentition  NECK: supple, no masses felt LYMPH:  no palpable lymphadenopathy in the cervical, axillary or inguinal regions LUNGS: clear to auscultation and  No wheeze or crackles HEART/CVS: regular rate & rhythm and no murmurs; No lower extremity edema ABDOMEN: abdomen soft, non-tender and normal bowel sounds Musculoskeletal:no cyanosis of digits and no clubbing  PSYCH: alert & oriented x 3 with fluent speech; anxious NEURO: no focal motor/sensory deficits SKIN:  no rashes or significant lesions; mediport- no signs of infection.   LABORATORY DATA:  I have reviewed the data as listed Lab Results  Component Value Date   WBC 10.0 08/07/2015   HGB 15.3 08/07/2015   HCT 44.9 08/07/2015   MCV 97.1 08/07/2015   PLT 362 08/07/2015    Recent Labs  06/01/15 0430 06/02/15 0551 07/23/15 1543 08/07/15 0826  NA 142 133* 135 134*  K 3.7 3.5 3.6 3.4*  CL 105 97* 101 102  CO2 _0 GLUCOSE 105* 104* 106* 120*  BUN <5* <5* <5* <5*  CREATININE 0.58 0.57 0.57 0.56  CALCIUM 9.0 8.7* 9.1 8.5*  GFRNONAA >60 >60 >60 >60  GFRAA >60 >60 >60 >60  PROT 7.5  --  7.9 7.2  ALBUMIN 3.7  --  3.9 3.5  AST 46*  --   21 21  ALT 38  --  26 13*  ALKPHOS 129*  --  155* 122  BILITOT 0.4  --  0.8 0.4    RADIOGRAPHIC STUDIES: I have personally reviewed the radiological images as listed and agreed with the findings in the report. Dg Chest 1 View  07/13/2015  CLINICAL DATA:  Post bronchoscopy, shortness of breath. EXAM: CHEST 1 VIEW COMPARISON:  06/02/2015. FINDINGS: Trachea is midline. Heart size normal. Right upper lobe mass is again seen. No pneumothorax post biopsy. Lungs are otherwise clear. No pleural fluid.  IMPRESSION: Right upper lobe mass.  No pneumothorax after bronchoscopic biopsy. Electronically Signed   By: Lorin Picket M.D.   On: 07/13/2015 15:56   Mr Jeri Cos XQ Contrast  07/28/2015  CLINICAL DATA:  Stage III lung cancer. No current neurologic symptoms. EXAM: MRI HEAD WITHOUT AND WITH CONTRAST TECHNIQUE: Multiplanar, multiecho pulse sequences of the brain and surrounding structures were obtained without and with intravenous contrast. CONTRAST:  74m MULTIHANCE GADOBENATE DIMEGLUMINE 529 MG/ML IV SOLN COMPARISON:  PET scan 07/28/2015.  CT head 06/01/2015. FINDINGS: No evidence for acute stroke, acute hemorrhage, hydrocephalus, or extra-axial fluid. Normal for age cerebral volume. Mild subcortical and periventricular T2 and FLAIR hyperintensities, likely chronic microvascular ischemic change. Pituitary and cerebellar tonsils are unremarkable. Cervical spondylosis. Low signal intensity bone marrow in the upper cervical region and skull base, negative on PET scan, likely representing anemia or chronic disease. Post infusion, there is a solitary metastasis to the medial inferior RIGHT frontal lobe, roughly spherical, 3 mm in diameter. See axial image 30 series 10. No significant surrounding edema or mass effect. No other definite intra-axial metastases are seen. No upper cervical lesions are appreciated. No leptomeningeal or pachymeningeal enhancement. Extracranial soft tissues unremarkable.  No orbital findings.  IMPRESSION: Solitary 3 mm RIGHT frontal lobe metastasis. No significant surrounding edema or mass effect. Mild small vessel disease. Cervical spondylosis.  No definite osseous metastatic disease. These results will be called to the ordering clinician or representative by the Radiologist Assistant, and communication documented in the PACS or zVision Dashboard. Electronically Signed   By: JStaci RighterM.D.   On: 07/28/2015 16:51   Nm Pet Image Initial (pi) Skull Base To Thigh  07/28/2015  CLINICAL DATA:  Initial treatment strategy for right upper lobe lung mass. Right paratracheal lymph node EBUS guided FNA was positive for metastatic adenocarcinoma on 07/13/2015. EXAM: NUCLEAR MEDICINE PET SKULL BASE TO THIGH TECHNIQUE: 11.8 mCi F-18 FDG was injected intravenously. Full-ring PET imaging was performed from the skull base to thigh after the radiotracer. CT data was obtained and used for attenuation correction and anatomic localization. FASTING BLOOD GLUCOSE:  Value: 108 mg/dl COMPARISON:  06/01/2015 CT chest, abdomen and pelvis. FINDINGS: NECK No hypermetabolic lymph nodes in the neck. CHEST There is a hypermetabolic irregular 5.6 x 4.7 cm apical right upper lobe lung mass (series 3/image 60) with max SUV 13.2, which abuts the anterior and mediastinal visceral pleura. There is a new trace layering right pleural effusion. There is a hypermetabolic 2.1 cm right hilar lymph node (series 3/image 80) with max SUV 7.2. There is a hypermetabolic right paratracheal 2.0 cm lymph node (series 3/image 73) with max SUV 6.4. No hypermetabolic supraclavicular, contralateral mediastinal or contralateral hilar nodes. No hypermetabolic axillary nodes. Left subclavian MediPort terminates at the cavoatrial junction. Low-density 2.2 x 1.6 cm retroareolar left breast lesion (series 3/image 107) is non hypermetabolic and unchanged in size since 06/01/2015. No acute consolidative airspace disease or additional significant pulmonary  nodules. ABDOMEN/PELVIS No abnormal hypermetabolic activity within the liver, pancreas, adrenal glands, or spleen. No hypermetabolic lymph nodes in the abdomen or pelvis. SKELETON No focal hypermetabolic activity to suggest skeletal metastasis. There is expected mild hypermetabolism associated with healing posterior left fourth through ninth rib fractures and healing anterior left fourth through seventh rib fractures (max SUV 3.6). IMPRESSION: 1. Hypermetabolic 5.6 cm apical right upper lobe lung mass, in keeping with a primary bronchogenic carcinoma. This mass abuts the visceral pleura. 2. Hypermetabolic ipsilateral hilar and ipsilateral mediastinal nodal metastases.  No additional hypermetabolic metastatic disease. 3. PET-CT staging is at least stage IIIA (T2b N2 M0). Note is made of a new trace layering right pleural effusion, which is indeterminate for a malignant effusion. 4. Expected mild hypermetabolism associated with healing left rib fractures. Electronically Signed   By: Ilona Sorrel M.D.   On: 07/28/2015 15:05   Dg Chest Port 1 View  07/27/2015  CLINICAL DATA:  Left subclavian Port-A-Cath insertion. Right upper lobe lung cancer. EXAM: PORTABLE CHEST 1 VIEW COMPARISON:  07/13/2015 and earlier. FINDINGS: Left subclavian Port-A-Cath tip projects over the lower SVC near the cavoatrial junction. No evidence of pneumothorax or mediastinal hematoma. Right upper lobe lung mass as on prior examinations. No new pulmonary parenchymal abnormalities. IMPRESSION: 1. Left subclavian Port-A-Cath tip projects over the lower SVC. No acute complicating features. 2. Right upper lobe lung mass as noted previously. No new/acute cardiopulmonary disease. Electronically Signed   By: Evangeline Dakin M.D.   On: 07/27/2015 17:08   Dg C-arm 1-60 Min-no Report  07/27/2015  CLINICAL DATA: port a cath insertion C-ARM 1-60 MINUTES Fluoroscopy was utilized by the requesting physician.  No radiographic interpretation.   Dg C-arm  1-60 Min-no Report  07/13/2015  CLINICAL DATA: lung mass C-ARM 1-60 MINUTES Fluoroscopy was utilized by the requesting physician.  No radiographic interpretation.    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 Start carbotaxol cycle #1 today; start radiation March 7th. Reviewed the labs today CBC CMP within normal limits.  #  3 mmsolitary/ brain metastases- recommend radiation/as per radiation oncology.  # patient will follow-up in 1 week for chemotherapy labs; follow-up with me in 2 weeks chemotherapy labs.   # 25  minutes face-to-face with the patient discussing the above plan of care; more than 50% of time spent on prognosis/ natural history; counseling and coordination.     Cammie Sickle, MD 08/07/2015 9:00 AM

## 2015-08-10 ENCOUNTER — Ambulatory Visit
Admission: RE | Admit: 2015-08-10 | Discharge: 2015-08-10 | Disposition: A | Discharge status: Home / Self Care | Payer: BLUE CROSS/BLUE SHIELD | Source: Ambulatory Visit | Attending: Radiation Oncology | Admitting: Radiation Oncology

## 2015-08-10 DIAGNOSIS — C3411 Malignant neoplasm of upper lobe, right bronchus or lung: Secondary | ICD-10-CM | POA: Diagnosis not present

## 2015-08-11 ENCOUNTER — Ambulatory Visit
Admission: RE | Admit: 2015-08-11 | Discharge: 2015-08-11 | Disposition: A | Discharge status: Home / Self Care | Payer: BLUE CROSS/BLUE SHIELD | Source: Ambulatory Visit | Attending: Radiation Oncology | Admitting: Radiation Oncology

## 2015-08-11 DIAGNOSIS — C3411 Malignant neoplasm of upper lobe, right bronchus or lung: Secondary | ICD-10-CM | POA: Diagnosis not present

## 2015-08-12 ENCOUNTER — Telehealth: Payer: Self-pay

## 2015-08-12 ENCOUNTER — Ambulatory Visit
Admission: RE | Admit: 2015-08-12 | Discharge: 2015-08-12 | Disposition: A | Discharge status: Home / Self Care | Payer: BLUE CROSS/BLUE SHIELD | Source: Ambulatory Visit | Attending: Radiation Oncology | Admitting: Radiation Oncology

## 2015-08-12 DIAGNOSIS — C3411 Malignant neoplasm of upper lobe, right bronchus or lung: Secondary | ICD-10-CM | POA: Diagnosis not present

## 2015-08-13 ENCOUNTER — Ambulatory Visit
Admission: RE | Admit: 2015-08-13 | Discharge: 2015-08-13 | Disposition: A | Discharge status: Home / Self Care | Payer: BLUE CROSS/BLUE SHIELD | Source: Ambulatory Visit | Attending: Radiation Oncology | Admitting: Radiation Oncology

## 2015-08-13 ENCOUNTER — Other Ambulatory Visit: Payer: Self-pay | Admitting: *Deleted

## 2015-08-13 DIAGNOSIS — C3411 Malignant neoplasm of upper lobe, right bronchus or lung: Secondary | ICD-10-CM | POA: Diagnosis not present

## 2015-08-13 MED ORDER — GABAPENTIN 100 MG PO CAPS: 100.0000 mg | ORAL_CAPSULE | Freq: Three times a day (TID) | ORAL | Status: DC

## 2015-08-13 NOTE — Telephone Encounter (Signed)
Pt came into clinic because she did not see the neurontin that she was told would be sent in.  I forgot to do it and I sent it in electronically and pt told that it was just sent in. I apologize for the problem.

## 2015-08-14 ENCOUNTER — Ambulatory Visit
Admission: RE | Admit: 2015-08-14 | Discharge: 2015-08-14 | Disposition: A | Discharge status: Home / Self Care | Payer: BLUE CROSS/BLUE SHIELD | Source: Ambulatory Visit | Attending: Radiation Oncology | Admitting: Radiation Oncology

## 2015-08-14 ENCOUNTER — Inpatient Hospital Stay: Payer: BLUE CROSS/BLUE SHIELD

## 2015-08-14 VITALS — BP 112/73 | HR 102 | Resp 18

## 2015-08-14 DIAGNOSIS — C3411 Malignant neoplasm of upper lobe, right bronchus or lung: Secondary | ICD-10-CM | POA: Diagnosis not present

## 2015-08-14 LAB — CBC WITH DIFFERENTIAL/PLATELET
BASOS ABS: 0.1 10*3/uL (ref 0–0.1)
Basophils Relative: 1 %
EOS PCT: 3 %
Eosinophils Absolute: 0.3 10*3/uL (ref 0–0.7)
HCT: 42.4 % (ref 35.0–47.0)
HEMOGLOBIN: 14.6 g/dL (ref 12.0–16.0)
LYMPHS ABS: 1.3 10*3/uL (ref 1.0–3.6)
LYMPHS PCT: 15 %
MCH: 34 pg (ref 26.0–34.0)
MCHC: 34.5 g/dL (ref 32.0–36.0)
MCV: 98.6 fL (ref 80.0–100.0)
Monocytes Absolute: 0.9 10*3/uL (ref 0.2–0.9)
Monocytes Relative: 10 %
NEUTROS ABS: 6.2 10*3/uL (ref 1.4–6.5)
NEUTROS PCT: 71 %
PLATELETS: 324 10*3/uL (ref 150–440)
RBC: 4.3 MIL/uL (ref 3.80–5.20)
RDW: 16.2 % — ABNORMAL HIGH (ref 11.5–14.5)
WBC: 8.7 10*3/uL (ref 3.6–11.0)

## 2015-08-14 LAB — COMPREHENSIVE METABOLIC PANEL
ALBUMIN: 3.5 g/dL (ref 3.5–5.0)
ALT: 27 U/L (ref 14–54)
ANION GAP: 7 (ref 5–15)
AST: 49 U/L — AB (ref 15–41)
Alkaline Phosphatase: 135 U/L — ABNORMAL HIGH (ref 38–126)
BILIRUBIN TOTAL: 0.6 mg/dL (ref 0.3–1.2)
BUN: 9 mg/dL (ref 6–20)
CHLORIDE: 98 mmol/L — AB (ref 101–111)
CO2: 25 mmol/L (ref 22–32)
Calcium: 8.6 mg/dL — ABNORMAL LOW (ref 8.9–10.3)
Creatinine, Ser: 0.57 mg/dL (ref 0.44–1.00)
Glucose, Bld: 125 mg/dL — ABNORMAL HIGH (ref 65–99)
POTASSIUM: 4 mmol/L (ref 3.5–5.1)
SODIUM: 130 mmol/L — AB (ref 135–145)
TOTAL PROTEIN: 7.1 g/dL (ref 6.5–8.1)

## 2015-08-14 MED ORDER — SODIUM CHLORIDE 0.9% FLUSH
10.0000 mL | INTRAVENOUS | Status: DC | PRN
  Administered 2015-08-14: 10 mL via INTRAVENOUS
  Filled 2015-08-14: qty 10

## 2015-08-14 MED ORDER — PALONOSETRON HCL INJECTION 0.25 MG/5ML
0.2500 mg | Freq: Once | INTRAVENOUS | Status: AC
  Administered 2015-08-14: 0.25 mg via INTRAVENOUS
  Filled 2015-08-14: qty 5

## 2015-08-14 MED ORDER — HEPARIN SOD (PORK) LOCK FLUSH 100 UNIT/ML IV SOLN: 500.0000 [IU] | Freq: Once | INTRAVENOUS | Status: DC | PRN

## 2015-08-14 MED ORDER — PACLITAXEL CHEMO INJECTION 300 MG/50ML
45.0000 mg/m2 | Freq: Once | INTRAVENOUS | Status: AC
Start: 2015-08-14 — End: 2015-08-14
  Administered 2015-08-14: 72 mg via INTRAVENOUS
  Filled 2015-08-14: qty 12

## 2015-08-14 MED ORDER — DIPHENHYDRAMINE HCL 50 MG/ML IJ SOLN
50.0000 mg | Freq: Once | INTRAMUSCULAR | Status: AC
  Administered 2015-08-14: 50 mg via INTRAVENOUS
  Filled 2015-08-14: qty 1

## 2015-08-14 MED ORDER — SODIUM CHLORIDE 0.9 % IV SOLN
20.0000 mg | Freq: Once | INTRAVENOUS | Status: AC
  Administered 2015-08-14: 20 mg via INTRAVENOUS
  Filled 2015-08-14: qty 2

## 2015-08-14 MED ORDER — SODIUM CHLORIDE 0.9 % IV SOLN
229.0000 mg | Freq: Once | INTRAVENOUS | Status: AC
  Administered 2015-08-14: 230 mg via INTRAVENOUS
  Filled 2015-08-14: qty 23

## 2015-08-14 MED ORDER — SODIUM CHLORIDE 0.9 % IV SOLN
Freq: Once | INTRAVENOUS | Status: AC
  Administered 2015-08-14: 11:00:00 via INTRAVENOUS
  Filled 2015-08-14: qty 1000

## 2015-08-14 MED ORDER — HEPARIN SOD (PORK) LOCK FLUSH 100 UNIT/ML IV SOLN
500.0000 [IU] | Freq: Once | INTRAVENOUS | Status: AC
  Administered 2015-08-14: 500 [IU] via INTRAVENOUS
  Filled 2015-08-14: qty 5

## 2015-08-14 MED ORDER — FAMOTIDINE IN NACL 20-0.9 MG/50ML-% IV SOLN
20.0000 mg | Freq: Once | INTRAVENOUS | Status: AC
  Administered 2015-08-14: 20 mg via INTRAVENOUS
  Filled 2015-08-14: qty 50

## 2015-08-17 ENCOUNTER — Ambulatory Visit
Admission: RE | Admit: 2015-08-17 | Discharge: 2015-08-17 | Disposition: A | Discharge status: Home / Self Care | Payer: BLUE CROSS/BLUE SHIELD | Source: Ambulatory Visit | Attending: Radiation Oncology | Admitting: Radiation Oncology

## 2015-08-17 DIAGNOSIS — C3411 Malignant neoplasm of upper lobe, right bronchus or lung: Secondary | ICD-10-CM | POA: Diagnosis not present

## 2015-08-18 ENCOUNTER — Ambulatory Visit
Admission: RE | Admit: 2015-08-18 | Discharge: 2015-08-18 | Disposition: A | Discharge status: Home / Self Care | Payer: BLUE CROSS/BLUE SHIELD | Source: Ambulatory Visit | Attending: Radiation Oncology | Admitting: Radiation Oncology

## 2015-08-18 DIAGNOSIS — C3411 Malignant neoplasm of upper lobe, right bronchus or lung: Secondary | ICD-10-CM | POA: Diagnosis not present

## 2015-08-19 ENCOUNTER — Ambulatory Visit
Admission: RE | Admit: 2015-08-19 | Discharge: 2015-08-19 | Disposition: A | Discharge status: Home / Self Care | Payer: BLUE CROSS/BLUE SHIELD | Source: Ambulatory Visit | Attending: Radiation Oncology | Admitting: Radiation Oncology

## 2015-08-19 DIAGNOSIS — C3411 Malignant neoplasm of upper lobe, right bronchus or lung: Secondary | ICD-10-CM | POA: Diagnosis not present

## 2015-08-20 ENCOUNTER — Ambulatory Visit
Admission: RE | Admit: 2015-08-20 | Discharge: 2015-08-20 | Disposition: A | Discharge status: Home / Self Care | Payer: BLUE CROSS/BLUE SHIELD | Source: Ambulatory Visit | Attending: Radiation Oncology | Admitting: Radiation Oncology

## 2015-08-20 DIAGNOSIS — C3411 Malignant neoplasm of upper lobe, right bronchus or lung: Secondary | ICD-10-CM | POA: Diagnosis not present

## 2015-08-21 ENCOUNTER — Inpatient Hospital Stay: Payer: BLUE CROSS/BLUE SHIELD

## 2015-08-21 ENCOUNTER — Ambulatory Visit
Admission: RE | Admit: 2015-08-21 | Discharge: 2015-08-21 | Disposition: A | Discharge status: Home / Self Care | Payer: BLUE CROSS/BLUE SHIELD | Source: Ambulatory Visit | Attending: Radiation Oncology | Admitting: Radiation Oncology

## 2015-08-21 ENCOUNTER — Inpatient Hospital Stay (HOSPITAL_BASED_OUTPATIENT_CLINIC_OR_DEPARTMENT_OTHER): Payer: BLUE CROSS/BLUE SHIELD | Admitting: Internal Medicine

## 2015-08-21 VITALS — BP 138/86 | HR 96 | Temp 97.2°F | Resp 18 | Wt 142.0 lb

## 2015-08-21 DIAGNOSIS — C3411 Malignant neoplasm of upper lobe, right bronchus or lung: Secondary | ICD-10-CM

## 2015-08-21 DIAGNOSIS — Z800 Family history of malignant neoplasm of digestive organs: Secondary | ICD-10-CM

## 2015-08-21 DIAGNOSIS — Z79899 Other long term (current) drug therapy: Secondary | ICD-10-CM

## 2015-08-21 DIAGNOSIS — K219 Gastro-esophageal reflux disease without esophagitis: Secondary | ICD-10-CM

## 2015-08-21 DIAGNOSIS — R112 Nausea with vomiting, unspecified: Secondary | ICD-10-CM

## 2015-08-21 DIAGNOSIS — F17210 Nicotine dependence, cigarettes, uncomplicated: Secondary | ICD-10-CM

## 2015-08-21 DIAGNOSIS — C7931 Secondary malignant neoplasm of brain: Secondary | ICD-10-CM

## 2015-08-21 DIAGNOSIS — C771 Secondary and unspecified malignant neoplasm of intrathoracic lymph nodes: Secondary | ICD-10-CM | POA: Diagnosis not present

## 2015-08-21 DIAGNOSIS — G2581 Restless legs syndrome: Secondary | ICD-10-CM

## 2015-08-21 LAB — CBC WITH DIFFERENTIAL/PLATELET
BASOS ABS: 0 10*3/uL (ref 0–0.1)
BASOS PCT: 1 %
EOS PCT: 2 %
Eosinophils Absolute: 0.1 10*3/uL (ref 0–0.7)
HCT: 40.1 % (ref 35.0–47.0)
Hemoglobin: 13.7 g/dL (ref 12.0–16.0)
Lymphocytes Relative: 22 %
Lymphs Abs: 1.3 10*3/uL (ref 1.0–3.6)
MCH: 33.9 pg (ref 26.0–34.0)
MCHC: 34.1 g/dL (ref 32.0–36.0)
MCV: 99.5 fL (ref 80.0–100.0)
MONO ABS: 0.5 10*3/uL (ref 0.2–0.9)
Monocytes Relative: 8 %
NEUTROS ABS: 3.9 10*3/uL (ref 1.4–6.5)
Neutrophils Relative %: 67 %
PLATELETS: 267 10*3/uL (ref 150–440)
RBC: 4.03 MIL/uL (ref 3.80–5.20)
RDW: 16.9 % — AB (ref 11.5–14.5)
WBC: 5.9 10*3/uL (ref 3.6–11.0)

## 2015-08-21 LAB — COMPREHENSIVE METABOLIC PANEL
ALBUMIN: 3.4 g/dL — AB (ref 3.5–5.0)
ALT: 35 U/L (ref 14–54)
AST: 41 U/L (ref 15–41)
Alkaline Phosphatase: 103 U/L (ref 38–126)
Anion gap: 7 (ref 5–15)
BUN: 7 mg/dL (ref 6–20)
CHLORIDE: 102 mmol/L (ref 101–111)
CO2: 27 mmol/L (ref 22–32)
Calcium: 8.4 mg/dL — ABNORMAL LOW (ref 8.9–10.3)
Creatinine, Ser: 0.47 mg/dL (ref 0.44–1.00)
GFR calc Af Amer: >60 mL/min (ref >60)
GFR calc non Af Amer: >60 mL/min (ref >60)
GLUCOSE: 107 mg/dL — AB (ref 65–99)
POTASSIUM: 3.6 mmol/L (ref 3.5–5.1)
Sodium: 136 mmol/L (ref 135–145)
Total Bilirubin: 0.5 mg/dL (ref 0.3–1.2)
Total Protein: 6.8 g/dL (ref 6.5–8.1)

## 2015-08-21 MED ORDER — DIPHENHYDRAMINE HCL 50 MG/ML IJ SOLN
50.0000 mg | Freq: Once | INTRAMUSCULAR | Status: AC
  Administered 2015-08-21: 50 mg via INTRAVENOUS
  Filled 2015-08-21: qty 1

## 2015-08-21 MED ORDER — SODIUM CHLORIDE 0.9% FLUSH
10.0000 mL | Freq: Once | INTRAVENOUS | Status: AC
  Administered 2015-08-21: 10 mL via INTRAVENOUS
  Filled 2015-08-21: qty 10

## 2015-08-21 MED ORDER — FAMOTIDINE IN NACL 20-0.9 MG/50ML-% IV SOLN
20.0000 mg | Freq: Once | INTRAVENOUS | Status: AC
  Administered 2015-08-21: 20 mg via INTRAVENOUS
  Filled 2015-08-21: qty 50

## 2015-08-21 MED ORDER — HEPARIN SOD (PORK) LOCK FLUSH 100 UNIT/ML IV SOLN
500.0000 [IU] | Freq: Once | INTRAVENOUS | Status: AC
  Administered 2015-08-21: 500 [IU] via INTRAVENOUS
  Filled 2015-08-21: qty 5

## 2015-08-21 MED ORDER — PALONOSETRON HCL INJECTION 0.25 MG/5ML
0.2500 mg | Freq: Once | INTRAVENOUS | Status: AC
  Administered 2015-08-21: 0.25 mg via INTRAVENOUS
  Filled 2015-08-21: qty 5

## 2015-08-21 MED ORDER — PACLITAXEL CHEMO INJECTION 300 MG/50ML
45.0000 mg/m2 | Freq: Once | INTRAVENOUS | Status: AC
  Administered 2015-08-21: 72 mg via INTRAVENOUS
  Filled 2015-08-21: qty 12

## 2015-08-21 MED ORDER — SODIUM CHLORIDE 0.9 % IV SOLN
229.0000 mg | Freq: Once | INTRAVENOUS | Status: AC
  Administered 2015-08-21: 230 mg via INTRAVENOUS
  Filled 2015-08-21: qty 23

## 2015-08-21 MED ORDER — SODIUM CHLORIDE 0.9 % IV SOLN
Freq: Once | INTRAVENOUS | Status: AC
  Administered 2015-08-21: 11:00:00 via INTRAVENOUS
  Filled 2015-08-21: qty 1000

## 2015-08-21 MED ORDER — SODIUM CHLORIDE 0.9 % IV SOLN
20.0000 mg | Freq: Once | INTRAVENOUS | Status: AC
  Administered 2015-08-21: 20 mg via INTRAVENOUS
  Filled 2015-08-21: qty 2

## 2015-08-21 NOTE — Progress Notes (Signed)
[place] CONSULT NOTE  Patient Care Team: Pcp Not In System as PCP - General  CHIEF COMPLAINTS/PURPOSE OF CONSULTATION:   # FEB 2017- ADENOCARCINOMA RUL [T2N2; STAGE III  Bronch/FNA- Right paratracheal LN;Dr.Ram]. PET- no distant mets; March 3rd START CARBO-TAXOL Weekly +RT  # FEB 2017- MRI Brain 46m focus of met [Right frontal]  # "Chronic pancreatitis"/ smoker [15ppt]  HISTORY OF PRESENTING ILLNESS:  Tiffany Erickson 45y.o.  female newly diagnosed adenocarcinoma of the right lung-currently on concurrent chemoradiation; is here to proceed with cycle #3 of carbotaxol along with radiation.   Patient had mild nausea- improved with Zofran. She did have episode of "restless leg" . His after the chemotherapy. Resolved after taking 1 dose of Neurontin.  She denies any headaches. No swelling in the legs or arms. No hoarseness of voice. No vision changes or double vision.   ROS: A complete 10 point review of system is done which is negative except mentioned above in history of present illness  MEDICAL HISTORY:  Past Medical History  Diagnosis Date  . Chronic pancreatitis ([facility]   . Multiple fractures of ribs of right side 06/01/2015    "5" S/P fall  LEFT SIDE  . Concussion 06/01/2015    S/P fall-PT STATES IN PHONE INTERVIEW THAT SHE IS UNSURE IF SHE HAD A CONCUSSION OR NOT  . Lung mass     right side noted 06/01/2015  . Anxiety   . GERD (gastroesophageal reflux disease)   . Frequent UTI     "sometimes" (06/01/2015)  . Shortness of breath dyspnea     5 rib fx right side  . [place] [place]    METASTATIC ADENOCARCINOMA OF THE LUNG  . Broken ribs 05/2015    5 ribs    SURGICAL HISTORY: Past Surgical History  Procedure Laterality Date  . Tonsillectomy    . Myringotomy with tube placement Bilateral   . Eye muscle surgery Left ~ 1979  . Esophagogastroduodenoscopy    . Endobronchial ultrasound N/A 07/13/2015    Procedure: ENDOBRONCHIAL ULTRASOUND;  Surgeon:  PLaverle Hobby MD;  Location: [facility] ORS;  Service: Pulmonary;  Laterality: N/A;  . Portacath placement Left 07/27/2015    Procedure: INSERTION PORT-A-CATH;  Surgeon: TNestor Lewandowsky MD;  Location: [facility] ORS;  Service: General;  Laterality: Left;    SOCIAL HISTORY: Social History   Social History  . Marital Status: Single    Spouse Name: N/A  . Number of Children: N/A  . Years of Education: N/A   Occupational History  . Not on file.   Social History Main Topics  . Smoking status: Current Every Day Smoker -- 0.50 packs/day for 28 years    Types: Cigarettes  . Smokeless tobacco: Never Used  . Alcohol Use: 8.4 oz/week    2 Glasses of wine, 12 Cans of beer per week     Comment: 06/01/2015 "drink probably 4 times/wk; either ;4 beers or 2 glasses of wine when I do drink"  . Drug Use: No  . Sexual Activity: Not Currently   Other Topics Concern  . Not on file   Social History Narrative    FAMILY HISTORY: Family History  Problem Relation Age of Onset  . Colon cancer Maternal Grandfather   . CAD Maternal Grandfather   . Pancreatic cancer Maternal Grandmother     ALLERGIES:  is allergic to sulfa antibiotics.  MEDICATIONS:  Current Outpatient Prescriptions  Medication Sig Dispense Refill  . ALPRAZolam (XANAX) 1 MG tablet  Take 0.5-1 mg by mouth 3 (three) times daily as needed for anxiety.     . eszopiclone (LUNESTA) 1 MG TABS tablet Take 1 tablet (1 mg total) by mouth at bedtime as needed for sleep. Take immediately before bedtime 30 tablet 1  . gabapentin (NEURONTIN) 100 MG capsule Take 1 capsule (100 mg total) by mouth 3 (three) times daily. 90 capsule 2  . HYDROcodone-acetaminophen (NORCO/VICODIN) 5-325 MG tablet One tablet every 6 - 8 hours as needed for pain 30 tablet 0  . lidocaine-prilocaine (EMLA) cream Apply 1 application topically as needed. 30 g 6  . ondansetron (ZOFRAN) 8 MG tablet Take 1 tablet (8 mg total) by mouth every 8 (eight) hours as needed for nausea or  vomiting (start 3 days; after chemo). 40 tablet 0  . pantoprazole (PROTONIX) 20 MG tablet Take 20 mg by mouth at bedtime.     . prochlorperazine (COMPAZINE) 10 MG tablet Take 1 tablet (10 mg total) by mouth every 6 (six) hours as needed for nausea or vomiting. 40 tablet 0  . promethazine (PHENERGAN) 25 MG tablet Take 25 mg by mouth every 6 (six) hours as needed for nausea or vomiting.    . metoCLOPramide (REGLAN) 10 MG tablet Take 1 tablet (10 mg total) by mouth every 8 (eight) hours as needed for nausea or vomiting. (Patient not taking: Reported on 08/21/2015) 10 tablet 0   No current facility-administered medications for this visit.   Facility-Administered Medications Ordered in Other Visits  Medication Dose Route Frequency Provider Last Rate Last Dose  . heparin lock flush 100 unit/mL  500 Units Intravenous Once Cammie Sickle, MD          .  PHYSICAL EXAMINATION: ECOG PERFORMANCE STATUS: 0 - Asymptomatic  Filed Vitals:   08/21/15 0954  BP: 138/86  Pulse: 96  Temp: 97.2 F (36.2 C)  Resp: 18   Filed Weights   08/21/15 0954  Weight: 141 lb 15.6 oz (64.4 kg)    GENERAL: Well-nourished well-developed; Alert, no distress and comfortable. She is alone.  EYES: no pallor or icterus OROPHARYNX: no thrush or ulceration; good dentition  NECK: supple, no masses felt LYMPH:  no palpable lymphadenopathy in the cervical, axillary or inguinal regions LUNGS: clear to auscultation and  No wheeze or crackles HEART/CVS: regular rate & rhythm and no murmurs; No lower extremity edema ABDOMEN: abdomen soft, non-tender and normal bowel sounds Musculoskeletal:no cyanosis of digits and no clubbing  PSYCH: alert & oriented x 3 with fluent speech; anxious NEURO: no focal motor/sensory deficits SKIN:  no rashes or significant lesions; mediport- no signs of infection.   LABORATORY DATA:  I have reviewed the data as listed Lab Results  Component Value Date   WBC 5.9 08/21/2015   HGB 13.7  08/21/2015   HCT 40.1 08/21/2015   MCV 99.5 08/21/2015   PLT 267 08/21/2015    Recent Labs  08/07/15 0826 08/14/15 0921 08/21/15 0931  NA 134* 130* 136  K 3.4* 4.0 3.6  CL 102 98* 102  CO2 25 25 27   GLUCOSE 120* 125* 107*  BUN <5* 9 7  CREATININE 0.56 0.57 0.47  CALCIUM 8.5* 8.6* 8.4*  GFRNONAA >60 >60 >60  GFRAA >60 >60 >60  PROT 7.2 7.1 6.8  ALBUMIN 3.5 3.5 3.4*  AST 21 49* 41  ALT 13* 27 35  ALKPHOS 122 135* 103  BILITOT 0.4 0.6 0.5    RADIOGRAPHIC STUDIES: I have personally reviewed the radiological images as listed and  agreed with the findings in the report. Mr Jeri Cos Wo Contrast  07/28/2015  CLINICAL DATA:  Stage III lung cancer. No current neurologic symptoms. EXAM: MRI HEAD WITHOUT AND WITH CONTRAST TECHNIQUE: Multiplanar, multiecho pulse sequences of the brain and surrounding structures were obtained without and with intravenous contrast. CONTRAST:  48m MULTIHANCE GADOBENATE DIMEGLUMINE 529 MG/ML IV SOLN COMPARISON:  PET scan 07/28/2015.  CT head 06/01/2015. FINDINGS: No evidence for acute stroke, acute hemorrhage, hydrocephalus, or extra-axial fluid. Normal for age cerebral volume. Mild subcortical and periventricular T2 and FLAIR hyperintensities, likely chronic microvascular ischemic change. Pituitary and cerebellar tonsils are unremarkable. Cervical spondylosis. Low signal intensity bone marrow in the upper cervical region and skull base, negative on PET scan, likely representing anemia or chronic disease. Post infusion, there is a solitary metastasis to the medial inferior RIGHT frontal lobe, roughly spherical, 3 mm in diameter. See axial image 30 series 10. No significant surrounding edema or mass effect. No other definite intra-axial metastases are seen. No upper cervical lesions are appreciated. No leptomeningeal or pachymeningeal enhancement. Extracranial soft tissues unremarkable.  No orbital findings. IMPRESSION: Solitary 3 mm RIGHT frontal lobe metastasis. No  significant surrounding edema or mass effect. Mild small vessel disease. Cervical spondylosis.  No definite osseous metastatic disease. These results will be called to the ordering clinician or representative by the Radiologist Assistant, and communication documented in the PACS or zVision Dashboard. Electronically Signed   By: JStaci RighterM.D.   On: 07/28/2015 16:51   Nm Pet Image Initial (pi) Skull Base To Thigh  07/28/2015  CLINICAL DATA:  Initial treatment strategy for right upper lobe lung mass. Right paratracheal lymph node EBUS guided FNA was positive for metastatic adenocarcinoma on 07/13/2015. EXAM: NUCLEAR MEDICINE PET SKULL BASE TO THIGH TECHNIQUE: 11.8 mCi F-18 FDG was injected intravenously. Full-ring PET imaging was performed from the skull base to thigh after the radiotracer. CT data was obtained and used for attenuation correction and anatomic localization. FASTING BLOOD GLUCOSE:  Value: 108 mg/dl COMPARISON:  06/01/2015 CT chest, abdomen and pelvis. FINDINGS: NECK No hypermetabolic lymph nodes in the neck. CHEST There is a hypermetabolic irregular 5.6 x 4.7 cm apical right upper lobe lung mass (series 3/image 60) with max SUV 13.2, which abuts the anterior and mediastinal visceral pleura. There is a new trace layering right pleural effusion. There is a hypermetabolic 2.1 cm right hilar lymph node (series 3/image 80) with max SUV 7.2. There is a hypermetabolic right paratracheal 2.0 cm lymph node (series 3/image 73) with max SUV 6.4. No hypermetabolic supraclavicular, contralateral mediastinal or contralateral hilar nodes. No hypermetabolic axillary nodes. Left subclavian MediPort terminates at the cavoatrial junction. Low-density 2.2 x 1.6 cm retroareolar left breast lesion (series 3/image 107) is non hypermetabolic and unchanged in size since 06/01/2015. No acute consolidative airspace disease or additional significant pulmonary nodules. ABDOMEN/PELVIS No abnormal hypermetabolic activity  within the liver, pancreas, adrenal glands, or spleen. No hypermetabolic lymph nodes in the abdomen or pelvis. SKELETON No focal hypermetabolic activity to suggest skeletal metastasis. There is expected mild hypermetabolism associated with healing posterior left fourth through ninth rib fractures and healing anterior left fourth through seventh rib fractures (max SUV 3.6). IMPRESSION: 1. Hypermetabolic 5.6 cm apical right upper lobe lung mass, in keeping with a primary bronchogenic carcinoma. This mass abuts the visceral pleura. 2. Hypermetabolic ipsilateral hilar and ipsilateral mediastinal nodal metastases. No additional hypermetabolic metastatic disease. 3. PET-CT staging is at least stage IIIA (T2b N2 M0). Note is made of  a new trace layering right pleural effusion, which is indeterminate for a malignant effusion. 4. Expected mild hypermetabolism associated with healing left rib fractures. Electronically Signed   By: Ilona Sorrel M.D.   On: 07/28/2015 15:05   Dg Chest Port 1 View  07/27/2015  CLINICAL DATA:  Left subclavian Port-A-Cath insertion. Right upper lobe lung cancer. EXAM: PORTABLE CHEST 1 VIEW COMPARISON:  07/13/2015 and earlier. FINDINGS: Left subclavian Port-A-Cath tip projects over the lower SVC near the cavoatrial junction. No evidence of pneumothorax or mediastinal hematoma. Right upper lobe lung mass as on prior examinations. No new pulmonary parenchymal abnormalities. IMPRESSION: 1. Left subclavian Port-A-Cath tip projects over the lower SVC. No acute complicating features. 2. Right upper lobe lung mass as noted previously. No new/acute cardiopulmonary disease. Electronically Signed   By: Evangeline Dakin M.D.   On: 07/27/2015 17:08   Dg C-arm 1-60 Min-no Report  07/27/2015  CLINICAL DATA: port a cath insertion C-ARM 1-60 MINUTES Fluoroscopy was utilized by the requesting physician.  No radiographic interpretation.    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  # Patient tolerating treatments fairly well. Proceed with cycle #3 today. CBC CMP unremarkable.  #  3 mmsolitary/ brain metastases- recommend radiation/as per radiation oncology.  # Nausea/vomiting grade 1 on Zofran.  # patient will follow-up in 1 week for chemotherapy labs; follow-up with me in 2 weeks chemotherapy labs.   # 25  minutes face-to-face with the patient discussing the above plan of care; more than 50% of time spent on prognosis/ natural history; counseling and coordination.     Cammie Sickle, MD 08/21/2015 10:41 AM

## 2015-08-21 NOTE — Progress Notes (Signed)
Patient has occasional nausea that is relieved by the Zofran.  She did get the Gabapentin from the pharmacy but has only had to take it one night.  Does have right side chest pain frequently on 6/10 on pain scale that is worse with coughing.  Appetite is not doing well and thinks it is due to "being full of gas" has to be careful what or how she eats because of her history pancreatitis.

## 2015-08-24 ENCOUNTER — Ambulatory Visit
Admission: RE | Admit: 2015-08-24 | Discharge: 2015-08-24 | Disposition: A | Discharge status: Home / Self Care | Payer: BLUE CROSS/BLUE SHIELD | Source: Ambulatory Visit | Attending: Radiation Oncology | Admitting: Radiation Oncology

## 2015-08-24 DIAGNOSIS — C3411 Malignant neoplasm of upper lobe, right bronchus or lung: Secondary | ICD-10-CM | POA: Diagnosis not present

## 2015-08-25 ENCOUNTER — Ambulatory Visit
Admission: RE | Admit: 2015-08-25 | Discharge: 2015-08-25 | Disposition: A | Discharge status: Home / Self Care | Payer: BLUE CROSS/BLUE SHIELD | Source: Ambulatory Visit | Attending: Radiation Oncology | Admitting: Radiation Oncology

## 2015-08-25 ENCOUNTER — Other Ambulatory Visit: Payer: Self-pay | Admitting: *Deleted

## 2015-08-25 DIAGNOSIS — C3411 Malignant neoplasm of upper lobe, right bronchus or lung: Secondary | ICD-10-CM | POA: Diagnosis not present

## 2015-08-25 MED ORDER — MAGIC MOUTHWASH: 5.0000 mL | Freq: Three times a day (TID) | ORAL | Status: DC | PRN

## 2015-08-26 ENCOUNTER — Ambulatory Visit
Admission: RE | Admit: 2015-08-26 | Discharge: 2015-08-26 | Disposition: A | Discharge status: Home / Self Care | Payer: BLUE CROSS/BLUE SHIELD | Source: Ambulatory Visit | Attending: Radiation Oncology | Admitting: Radiation Oncology

## 2015-08-26 DIAGNOSIS — C3411 Malignant neoplasm of upper lobe, right bronchus or lung: Secondary | ICD-10-CM | POA: Diagnosis not present

## 2015-08-27 ENCOUNTER — Ambulatory Visit
Admission: RE | Admit: 2015-08-27 | Discharge: 2015-08-27 | Disposition: A | Discharge status: Home / Self Care | Payer: BLUE CROSS/BLUE SHIELD | Source: Ambulatory Visit | Attending: Radiation Oncology | Admitting: Radiation Oncology

## 2015-08-27 ENCOUNTER — Other Ambulatory Visit: Payer: Self-pay | Admitting: *Deleted

## 2015-08-27 DIAGNOSIS — C3411 Malignant neoplasm of upper lobe, right bronchus or lung: Secondary | ICD-10-CM | POA: Diagnosis not present

## 2015-08-27 LAB — ACID FAST SMEAR+CULTURE W/RFLX (ARMC ONLY)
ACID FAST SMEAR - AFSCU2: NEGATIVE
Acid Fast Culture: NEGATIVE

## 2015-08-27 MED ORDER — MAGIC MOUTHWASH W/LIDOCAINE: 5.0000 mL | Freq: Three times a day (TID) | ORAL | Status: DC

## 2015-08-28 ENCOUNTER — Inpatient Hospital Stay: Payer: BLUE CROSS/BLUE SHIELD

## 2015-08-28 ENCOUNTER — Other Ambulatory Visit: Payer: Self-pay | Admitting: *Deleted

## 2015-08-28 ENCOUNTER — Ambulatory Visit
Admission: RE | Admit: 2015-08-28 | Discharge: 2015-08-28 | Disposition: A | Discharge status: Home / Self Care | Payer: BLUE CROSS/BLUE SHIELD | Source: Ambulatory Visit | Attending: Radiation Oncology | Admitting: Radiation Oncology

## 2015-08-28 VITALS — BP 111/74 | HR 96 | Temp 97.6°F | Resp 18

## 2015-08-28 DIAGNOSIS — C3411 Malignant neoplasm of upper lobe, right bronchus or lung: Secondary | ICD-10-CM | POA: Diagnosis not present

## 2015-08-28 LAB — BASIC METABOLIC PANEL
ANION GAP: 5 (ref 5–15)
BUN: 7 mg/dL (ref 6–20)
CO2: 28 mmol/L (ref 22–32)
Calcium: 8.4 mg/dL — ABNORMAL LOW (ref 8.9–10.3)
Chloride: 101 mmol/L (ref 101–111)
Creatinine, Ser: 0.48 mg/dL (ref 0.44–1.00)
GFR calc Af Amer: >60 mL/min (ref >60)
GFR calc non Af Amer: >60 mL/min (ref >60)
GLUCOSE: 108 mg/dL — AB (ref 65–99)
POTASSIUM: 3.6 mmol/L (ref 3.5–5.1)
Sodium: 134 mmol/L — ABNORMAL LOW (ref 135–145)

## 2015-08-28 LAB — CBC WITH DIFFERENTIAL/PLATELET
BASOS ABS: 0 10*3/uL (ref 0–0.1)
Basophils Relative: 1 %
Eosinophils Absolute: 0.1 10*3/uL (ref 0–0.7)
Eosinophils Relative: 2 %
HEMATOCRIT: 38.5 % (ref 35.0–47.0)
Hemoglobin: 13.2 g/dL (ref 12.0–16.0)
Lymphs Abs: 1.4 10*3/uL (ref 1.0–3.6)
MCH: 34 pg (ref 26.0–34.0)
MCHC: 34.3 g/dL (ref 32.0–36.0)
MCV: 99 fL (ref 80.0–100.0)
Monocytes Absolute: 0.6 10*3/uL (ref 0.2–0.9)
Monocytes Relative: 13 %
NEUTROS ABS: 2.3 10*3/uL (ref 1.4–6.5)
Neutrophils Relative %: 52 %
Platelets: 234 10*3/uL (ref 150–440)
RBC: 3.89 MIL/uL (ref 3.80–5.20)
RDW: 17.2 % — ABNORMAL HIGH (ref 11.5–14.5)
WBC: 4.4 10*3/uL (ref 3.6–11.0)

## 2015-08-28 MED ORDER — SODIUM CHLORIDE 0.9% FLUSH
10.0000 mL | INTRAVENOUS | Status: DC | PRN
  Administered 2015-08-28: 10 mL via INTRAVENOUS
  Filled 2015-08-28: qty 10

## 2015-08-28 MED ORDER — DEXAMETHASONE SODIUM PHOSPHATE 100 MG/10ML IJ SOLN
20.0000 mg | Freq: Once | INTRAMUSCULAR | Status: AC
  Administered 2015-08-28: 20 mg via INTRAVENOUS
  Filled 2015-08-28: qty 2

## 2015-08-28 MED ORDER — DIPHENHYDRAMINE HCL 50 MG/ML IJ SOLN
50.0000 mg | Freq: Once | INTRAMUSCULAR | Status: AC
  Administered 2015-08-28: 50 mg via INTRAVENOUS
  Filled 2015-08-28: qty 1

## 2015-08-28 MED ORDER — FAMOTIDINE IN NACL 20-0.9 MG/50ML-% IV SOLN
20.0000 mg | Freq: Once | INTRAVENOUS | Status: AC
  Administered 2015-08-28: 20 mg via INTRAVENOUS
  Filled 2015-08-28: qty 50

## 2015-08-28 MED ORDER — DEXTROSE 5 % IV SOLN
45.0000 mg/m2 | Freq: Once | INTRAVENOUS | Status: AC
  Administered 2015-08-28: 72 mg via INTRAVENOUS
  Filled 2015-08-28: qty 12

## 2015-08-28 MED ORDER — HEPARIN SOD (PORK) LOCK FLUSH 100 UNIT/ML IV SOLN
500.0000 [IU] | Freq: Once | INTRAVENOUS | Status: AC
  Administered 2015-08-28: 500 [IU] via INTRAVENOUS
  Filled 2015-08-28: qty 5

## 2015-08-28 MED ORDER — PALONOSETRON HCL INJECTION 0.25 MG/5ML
0.2500 mg | Freq: Once | INTRAVENOUS | Status: AC
  Administered 2015-08-28: 0.25 mg via INTRAVENOUS
  Filled 2015-08-28: qty 5

## 2015-08-28 MED ORDER — CARBOPLATIN CHEMO INJECTION 450 MG/45ML
229.0000 mg | Freq: Once | INTRAVENOUS | Status: AC
  Administered 2015-08-28: 230 mg via INTRAVENOUS
  Filled 2015-08-28: qty 23

## 2015-08-28 MED ORDER — SODIUM CHLORIDE 0.9 % IV SOLN
Freq: Once | INTRAVENOUS | Status: AC
  Administered 2015-08-28: 10:00:00 via INTRAVENOUS
  Filled 2015-08-28: qty 1000

## 2015-08-31 ENCOUNTER — Ambulatory Visit
Admission: RE | Admit: 2015-08-31 | Discharge: 2015-08-31 | Disposition: A | Discharge status: Home / Self Care | Payer: BLUE CROSS/BLUE SHIELD | Source: Ambulatory Visit | Attending: Radiation Oncology | Admitting: Radiation Oncology

## 2015-08-31 DIAGNOSIS — C3411 Malignant neoplasm of upper lobe, right bronchus or lung: Secondary | ICD-10-CM | POA: Diagnosis not present

## 2015-09-01 ENCOUNTER — Other Ambulatory Visit: Payer: Self-pay | Admitting: *Deleted

## 2015-09-01 ENCOUNTER — Ambulatory Visit
Admission: RE | Admit: 2015-09-01 | Discharge: 2015-09-01 | Disposition: A | Discharge status: Home / Self Care | Payer: BLUE CROSS/BLUE SHIELD | Source: Ambulatory Visit | Attending: Radiation Oncology | Admitting: Radiation Oncology

## 2015-09-01 DIAGNOSIS — C3411 Malignant neoplasm of upper lobe, right bronchus or lung: Secondary | ICD-10-CM | POA: Diagnosis not present

## 2015-09-01 MED ORDER — DEXAMETHASONE 4 MG PO TABS: 4.0000 mg | ORAL_TABLET | Freq: Every day | ORAL | Status: DC

## 2015-09-01 MED ORDER — SUCRALFATE 1 G PO TABS: 1.0000 g | ORAL_TABLET | Freq: Three times a day (TID) | ORAL | Status: DC

## 2015-09-02 ENCOUNTER — Ambulatory Visit
Admission: RE | Admit: 2015-09-02 | Discharge: 2015-09-02 | Disposition: A | Discharge status: Home / Self Care | Payer: BLUE CROSS/BLUE SHIELD | Source: Ambulatory Visit | Attending: Radiation Oncology | Admitting: Radiation Oncology

## 2015-09-02 DIAGNOSIS — C3411 Malignant neoplasm of upper lobe, right bronchus or lung: Secondary | ICD-10-CM | POA: Diagnosis not present

## 2015-09-03 ENCOUNTER — Ambulatory Visit
Admission: RE | Admit: 2015-09-03 | Discharge: 2015-09-03 | Disposition: A | Discharge status: Home / Self Care | Payer: BLUE CROSS/BLUE SHIELD | Source: Ambulatory Visit | Attending: Radiation Oncology | Admitting: Radiation Oncology

## 2015-09-03 DIAGNOSIS — C3411 Malignant neoplasm of upper lobe, right bronchus or lung: Secondary | ICD-10-CM | POA: Diagnosis not present

## 2015-09-04 ENCOUNTER — Inpatient Hospital Stay (HOSPITAL_BASED_OUTPATIENT_CLINIC_OR_DEPARTMENT_OTHER): Payer: BLUE CROSS/BLUE SHIELD | Admitting: Internal Medicine

## 2015-09-04 ENCOUNTER — Ambulatory Visit
Admission: RE | Admit: 2015-09-04 | Discharge: 2015-09-04 | Disposition: A | Discharge status: Home / Self Care | Payer: BLUE CROSS/BLUE SHIELD | Source: Ambulatory Visit | Attending: Radiation Oncology | Admitting: Radiation Oncology

## 2015-09-04 ENCOUNTER — Encounter: Payer: Self-pay | Admitting: Internal Medicine

## 2015-09-04 ENCOUNTER — Inpatient Hospital Stay: Payer: BLUE CROSS/BLUE SHIELD

## 2015-09-04 VITALS — BP 126/85 | HR 96 | Temp 96.7°F | Resp 18 | Wt 140.0 lb

## 2015-09-04 VITALS — BP 143/91 | HR 93

## 2015-09-04 DIAGNOSIS — R112 Nausea with vomiting, unspecified: Secondary | ICD-10-CM

## 2015-09-04 DIAGNOSIS — F17210 Nicotine dependence, cigarettes, uncomplicated: Secondary | ICD-10-CM

## 2015-09-04 DIAGNOSIS — K219 Gastro-esophageal reflux disease without esophagitis: Secondary | ICD-10-CM

## 2015-09-04 DIAGNOSIS — K861 Other chronic pancreatitis: Secondary | ICD-10-CM

## 2015-09-04 DIAGNOSIS — R630 Anorexia: Secondary | ICD-10-CM

## 2015-09-04 DIAGNOSIS — G6281 Critical illness polyneuropathy: Secondary | ICD-10-CM

## 2015-09-04 DIAGNOSIS — C3490 Malignant neoplasm of unspecified part of unspecified bronchus or lung: Secondary | ICD-10-CM

## 2015-09-04 DIAGNOSIS — D72819 Decreased white blood cell count, unspecified: Secondary | ICD-10-CM

## 2015-09-04 DIAGNOSIS — K561 Intussusception: Secondary | ICD-10-CM

## 2015-09-04 DIAGNOSIS — Z79899 Other long term (current) drug therapy: Secondary | ICD-10-CM

## 2015-09-04 DIAGNOSIS — C771 Secondary and unspecified malignant neoplasm of intrathoracic lymph nodes: Secondary | ICD-10-CM

## 2015-09-04 DIAGNOSIS — C3411 Malignant neoplasm of upper lobe, right bronchus or lung: Secondary | ICD-10-CM

## 2015-09-04 DIAGNOSIS — C7931 Secondary malignant neoplasm of brain: Secondary | ICD-10-CM

## 2015-09-04 DIAGNOSIS — R1013 Epigastric pain: Secondary | ICD-10-CM

## 2015-09-04 LAB — COMPREHENSIVE METABOLIC PANEL
ALBUMIN: 3.9 g/dL (ref 3.5–5.0)
ALT: 28 U/L (ref 14–54)
ANION GAP: 8 (ref 5–15)
AST: 28 U/L (ref 15–41)
Alkaline Phosphatase: 99 U/L (ref 38–126)
BILIRUBIN TOTAL: 0.5 mg/dL (ref 0.3–1.2)
BUN: 8 mg/dL (ref 6–20)
CO2: 24 mmol/L (ref 22–32)
Calcium: 8.8 mg/dL — ABNORMAL LOW (ref 8.9–10.3)
Chloride: 102 mmol/L (ref 101–111)
Creatinine, Ser: 0.54 mg/dL (ref 0.44–1.00)
GFR calc non Af Amer: >60 mL/min (ref >60)
GLUCOSE: 140 mg/dL — AB (ref 65–99)
POTASSIUM: 3.7 mmol/L (ref 3.5–5.1)
SODIUM: 134 mmol/L — AB (ref 135–145)
TOTAL PROTEIN: 7.3 g/dL (ref 6.5–8.1)

## 2015-09-04 LAB — CBC WITH DIFFERENTIAL/PLATELET
BASOS PCT: 0 %
Basophils Absolute: 0 10*3/uL (ref 0–0.1)
EOS ABS: 0 10*3/uL (ref 0–0.7)
Eosinophils Relative: 0 %
HCT: 37.8 % (ref 35.0–47.0)
Hemoglobin: 12.9 g/dL (ref 12.0–16.0)
Lymphocytes Relative: 9 %
Lymphs Abs: 0.2 10*3/uL — ABNORMAL LOW (ref 1.0–3.6)
MCH: 33.6 pg (ref 26.0–34.0)
MCHC: 34 g/dL (ref 32.0–36.0)
MCV: 98.9 fL (ref 80.0–100.0)
MONO ABS: 0.1 10*3/uL — AB (ref 0.2–0.9)
MONOS PCT: 3 %
Neutro Abs: 1.7 10*3/uL (ref 1.4–6.5)
Neutrophils Relative %: 88 %
Platelets: 189 10*3/uL (ref 150–440)
RBC: 3.82 MIL/uL (ref 3.80–5.20)
RDW: 17.7 % — AB (ref 11.5–14.5)
WBC: 2 10*3/uL — ABNORMAL LOW (ref 3.6–11.0)

## 2015-09-04 LAB — LIPASE, BLOOD: Lipase: 35 U/L (ref 11–51)

## 2015-09-04 LAB — AMYLASE: AMYLASE: 81 U/L (ref 28–100)

## 2015-09-04 MED ORDER — SODIUM CHLORIDE 0.9 % IV SOLN
Freq: Once | INTRAVENOUS | Status: AC
  Administered 2015-09-04: 10:00:00 via INTRAVENOUS
  Filled 2015-09-04: qty 1000

## 2015-09-04 MED ORDER — CARBOPLATIN CHEMO INJECTION 450 MG/45ML
229.0000 mg | Freq: Once | INTRAVENOUS | Status: AC
  Administered 2015-09-04: 230 mg via INTRAVENOUS
  Filled 2015-09-04: qty 23

## 2015-09-04 MED ORDER — SODIUM CHLORIDE 0.9% FLUSH
10.0000 mL | INTRAVENOUS | Status: DC | PRN
  Administered 2015-09-04: 10 mL
  Filled 2015-09-04: qty 10

## 2015-09-04 MED ORDER — DEXTROSE 5 % IV SOLN
45.0000 mg/m2 | Freq: Once | INTRAVENOUS | Status: AC
  Administered 2015-09-04: 72 mg via INTRAVENOUS
  Filled 2015-09-04: qty 12

## 2015-09-04 MED ORDER — SODIUM CHLORIDE 0.9 % IV SOLN
20.0000 mg | Freq: Once | INTRAVENOUS | Status: AC
  Administered 2015-09-04: 20 mg via INTRAVENOUS
  Filled 2015-09-04: qty 2

## 2015-09-04 MED ORDER — PALONOSETRON HCL INJECTION 0.25 MG/5ML
0.2500 mg | Freq: Once | INTRAVENOUS | Status: AC
  Administered 2015-09-04: 0.25 mg via INTRAVENOUS
  Filled 2015-09-04: qty 5

## 2015-09-04 MED ORDER — FAMOTIDINE IN NACL 20-0.9 MG/50ML-% IV SOLN
20.0000 mg | Freq: Once | INTRAVENOUS | Status: AC
  Administered 2015-09-04: 20 mg via INTRAVENOUS
  Filled 2015-09-04: qty 50

## 2015-09-04 MED ORDER — HEPARIN SOD (PORK) LOCK FLUSH 100 UNIT/ML IV SOLN
500.0000 [IU] | Freq: Once | INTRAVENOUS | Status: AC | PRN
  Administered 2015-09-04: 500 [IU]

## 2015-09-04 MED ORDER — DIPHENHYDRAMINE HCL 50 MG/ML IJ SOLN
50.0000 mg | Freq: Once | INTRAMUSCULAR | Status: AC
  Administered 2015-09-04: 50 mg via INTRAVENOUS
  Filled 2015-09-04: qty 1

## 2015-09-04 NOTE — Progress Notes (Signed)
Pt c/o dysphagia when swallowing. She started carafate last week.

## 2015-09-04 NOTE — Progress Notes (Signed)
Patient states she is having nausea and mid abdominal pain.  Patient has a history of pancreatitis and is unsure if her nausea is from her pancreatitis or chemo. Also states her throat is really sore from radiation.

## 2015-09-04 NOTE — Progress Notes (Signed)
[place] CONSULT NOTE  Patient Care Team: Pcp Not In System as PCP - General  CHIEF COMPLAINTS/PURPOSE OF CONSULTATION:   # FEB 2017- ADENOCARCINOMA RUL [T2N2; STAGE III  Bronch/FNA- Right paratracheal LN;Dr.Ram]. PET- no distant mets; March 3rd START CARBO-TAXOL Weekly +RT  # FEB 2017- MRI Brain 43m focus of met [Right frontal]  # "Chronic pancreatitis"/ smoker [15ppt]  HISTORY OF PRESENTING ILLNESS:  Tiffany Erickson 45y.o.  female newly diagnosed adenocarcinoma of the right lung-currently on concurrent chemoradiation; is here to proceed with cycle #3 of carbotaxol along with radiation.   Patient was started on Carafate/dexamethasone/PPI for abdominal discomfort by radiation oncology.   Patient had moderate nausea vomiting- however patient taking Compazine only as needed on Zofran. Also complains of worsening abdominal pain. Had episode of diarrhea with Magic mouthwash for constipation. Poor appetite. Poor by mouth intake. Complains of extreme fatigue.  She denies any headaches. No swelling in the legs or arms. No hoarseness of voice. No vision changes or double vision.   ROS: A complete 10 point review of system is done which is negative except mentioned above in history of present illness  MEDICAL HISTORY:  Past Medical History  Diagnosis Date  . Chronic pancreatitis ([place]   . Multiple fractures of ribs of right side 06/01/2015    "5" S/P fall  LEFT SIDE  . Concussion 06/01/2015    S/P fall-PT STATES IN PHONE INTERVIEW THAT SHE IS UNSURE IF SHE HAD A CONCUSSION OR NOT  . Lung mass     right side noted 06/01/2015  . Anxiety   . GERD (gastroesophageal reflux disease)   . Frequent UTI     "sometimes" (06/01/2015)  . Shortness of breath dyspnea     5 rib fx right side  . Lung cancer ([facility] 07/13/15    METASTATIC ADENOCARCINOMA OF THE LUNG  . Broken ribs 05/2015    5 ribs    SURGICAL HISTORY: Past Surgical History  Procedure Laterality Date  . Tonsillectomy     . Myringotomy with tube placement Bilateral   . Eye muscle surgery Left ~ 1979  . Esophagogastroduodenoscopy    . Endobronchial ultrasound N/A 07/13/2015    Procedure: ENDOBRONCHIAL ULTRASOUND;  Surgeon: PLaverle Hobby MD;  Location: [facility] ORS;  Service: Pulmonary;  Laterality: N/A;  . Portacath placement Left 07/27/2015    Procedure: INSERTION PORT-A-CATH;  Surgeon: TNestor Lewandowsky MD;  Location: [facility] ORS;  Service: General;  Laterality: Left;    SOCIAL HISTORY: Social History   Social History  . Marital Status: Single    Spouse Name: N/A  . Number of Children: N/A  . Years of Education: N/A   Occupational History  . Not on file.   Social History Main Topics  . Smoking status: Current Every Day Smoker -- 0.50 packs/day for 28 years    Types: Cigarettes  . Smokeless tobacco: Never Used  . Alcohol Use: 8.4 oz/week    2 Glasses of wine, 12 Cans of beer per week     Comment: 06/01/2015 "drink probably 4 times/wk; either ;4 beers or 2 glasses of wine when I do drink"  . Drug Use: No  . Sexual Activity: Not Currently   Other Topics Concern  . Not on file   Social History Narrative    FAMILY HISTORY: Family History  Problem Relation Age of Onset  . Colon cancer Maternal Grandfather   . CAD Maternal Grandfather   . Pancreatic cancer Maternal Grandmother  ALLERGIES:  is allergic to sulfa antibiotics.  MEDICATIONS:  Current Outpatient Prescriptions  Medication Sig Dispense Refill  . ALPRAZolam (XANAX) 1 MG tablet Take 0.5-1 mg by mouth 3 (three) times daily as needed for anxiety.     Marland Kitchen dexamethasone (DECADRON) 4 MG tablet Take 1 tablet (4 mg total) by mouth daily. Take daily x 10 days then 1 tab every other day until finished. 15 tablet 0  . eszopiclone (LUNESTA) 1 MG TABS tablet Take 1 tablet (1 mg total) by mouth at bedtime as needed for sleep. Take immediately before bedtime 30 tablet 1  . gabapentin (NEURONTIN) 100 MG capsule Take 1 capsule (100 mg total) by  mouth 3 (three) times daily. 90 capsule 2  . HYDROcodone-acetaminophen (NORCO/VICODIN) 5-325 MG tablet One tablet every 6 - 8 hours as needed for pain 30 tablet 0  . lidocaine-prilocaine (EMLA) cream Apply 1 application topically as needed. 30 g 6  . magic mouthwash w/lidocaine SOLN Take 5 mLs by mouth 3 (three) times daily. 237 mL 2  . metoCLOPramide (REGLAN) 10 MG tablet Take 1 tablet (10 mg total) by mouth every 8 (eight) hours as needed for nausea or vomiting. 10 tablet 0  . ondansetron (ZOFRAN) 8 MG tablet Take 1 tablet (8 mg total) by mouth every 8 (eight) hours as needed for nausea or vomiting (start 3 days; after chemo). 40 tablet 0  . pantoprazole (PROTONIX) 20 MG tablet Take 20 mg by mouth at bedtime.     . prochlorperazine (COMPAZINE) 10 MG tablet Take 1 tablet (10 mg total) by mouth every 6 (six) hours as needed for nausea or vomiting. 40 tablet 0  . promethazine (PHENERGAN) 25 MG tablet Take 25 mg by mouth every 6 (six) hours as needed for nausea or vomiting.    . sucralfate (CARAFATE) 1 g tablet Take 1 tablet (1 g total) by mouth 3 (three) times daily. 90 tablet 3  . hydrocortisone (CORTEF) 20 MG tablet   1   No current facility-administered medications for this visit.      Marland Kitchen  PHYSICAL EXAMINATION: ECOG PERFORMANCE STATUS: 0 - Asymptomatic  Filed Vitals:   09/04/15 0930  BP: 126/85  Pulse: 96  Temp: 96.7 F (35.9 C)  Resp: 18   Filed Weights   09/04/15 0930  Weight: 139 lb 15.9 oz (63.5 kg)    GENERAL: Well-nourished well-developed; Alert, no distress and comfortable. She is alone.  EYES: no pallor or icterus OROPHARYNX: no thrush or ulceration; good dentition  NECK: supple, no masses felt LYMPH:  no palpable lymphadenopathy in the cervical, axillary or inguinal regions LUNGS: clear to auscultation and  No wheeze or crackles HEART/CVS: regular rate & rhythm and no murmurs; No lower extremity edema ABDOMEN: abdomen soft, non-tender and normal bowel  sounds Musculoskeletal:no cyanosis of digits and no clubbing  PSYCH: alert & oriented x 3 with fluent speech; anxious NEURO: no focal motor/sensory deficits SKIN:  no rashes or significant lesions; mediport- no signs of infection.   LABORATORY DATA:  I have reviewed the data as listed Lab Results  Component Value Date   WBC 2.0* 09/04/2015   HGB 12.9 09/04/2015   HCT 37.8 09/04/2015   MCV 98.9 09/04/2015   PLT 189 09/04/2015    Recent Labs  08/07/15 0826 08/14/15 0921 08/21/15 0931 08/28/15 0834  NA 134* 130* 136 134*  K 3.4* 4.0 3.6 3.6  CL 102 98* 102 101  CO2 25 25 27 28   GLUCOSE 120* 125* 107* 108*  BUN <5* 9 7 7   CREATININE 0.56 0.57 0.47 0.48  CALCIUM 8.5* 8.6* 8.4* 8.4*  GFRNONAA >60 >60 >60 >60  GFRAA >60 >60 >60 >60  PROT 7.2 7.1 6.8  --   ALBUMIN 3.5 3.5 3.4*  --   AST 21 49* 41  --   ALT 13* 27 35  --   ALKPHOS 122 135* 103  --   BILITOT 0.4 0.6 0.5  --      ASSESSMENT & PLAN:    # STAGE IV [tiny focus of brain met] Adenocarcinoma T2N2; right upper lobe; currently on carbotaxol along with radiation- status post 4 treatments.  # Patient tolerating treatments with mild- mod difficulties.  Proceed with cycle #5 today. CBC- White count 2 but predominantly leukopenia. CMP unremarkable. Since patient getting off from radiation next week; I plan to hold chemotherapy on April 7th; we will plan to resume chemotherapy on 14th  # nausea vomiting- discussed regarding taking Zofran/Compazine around-the-clock.   # Abdominal discomfort dyspepsia- esophagitis; patient on Carafate/dexamethasone. Also on PPI. Check lipase and amylase. Recommend IV fluids as needed.  #  3 mmsolitary/ brain metastases- Radiation/as per radiation oncology.  # Follow up in the clinic for IV fluids in 1 week/ plan hold chemotherapy one week. We'll restart chemotherapy in 2 weeks.  # 25  minutes face-to-face with the patient discussing the above plan of care; more than 50% of time spent on  prognosis/ natural history; counseling and coordination.     Cammie Sickle, MD 09/04/2015 9:40 AM

## 2015-09-07 ENCOUNTER — Ambulatory Visit
Admission: RE | Admit: 2015-09-07 | Discharge: 2015-09-07 | Disposition: A | Discharge status: Home / Self Care | Payer: BLUE CROSS/BLUE SHIELD | Source: Ambulatory Visit | Attending: Radiation Oncology | Admitting: Radiation Oncology

## 2015-09-07 DIAGNOSIS — C3411 Malignant neoplasm of upper lobe, right bronchus or lung: Secondary | ICD-10-CM | POA: Diagnosis not present

## 2015-09-08 ENCOUNTER — Ambulatory Visit: Payer: BLUE CROSS/BLUE SHIELD

## 2015-09-09 ENCOUNTER — Ambulatory Visit: Payer: BLUE CROSS/BLUE SHIELD

## 2015-09-10 ENCOUNTER — Ambulatory Visit: Payer: BLUE CROSS/BLUE SHIELD

## 2015-09-11 ENCOUNTER — Ambulatory Visit: Payer: BLUE CROSS/BLUE SHIELD

## 2015-09-11 ENCOUNTER — Inpatient Hospital Stay: Payer: BLUE CROSS/BLUE SHIELD | Attending: Internal Medicine

## 2015-09-11 DIAGNOSIS — C771 Secondary and unspecified malignant neoplasm of intrathoracic lymph nodes: Secondary | ICD-10-CM | POA: Insufficient documentation

## 2015-09-11 DIAGNOSIS — Z79899 Other long term (current) drug therapy: Secondary | ICD-10-CM | POA: Diagnosis not present

## 2015-09-11 DIAGNOSIS — R0602 Shortness of breath: Secondary | ICD-10-CM | POA: Insufficient documentation

## 2015-09-11 DIAGNOSIS — R5383 Other fatigue: Secondary | ICD-10-CM | POA: Diagnosis not present

## 2015-09-11 DIAGNOSIS — C3411 Malignant neoplasm of upper lobe, right bronchus or lung: Secondary | ICD-10-CM

## 2015-09-11 DIAGNOSIS — R1310 Dysphagia, unspecified: Secondary | ICD-10-CM | POA: Insufficient documentation

## 2015-09-11 DIAGNOSIS — K861 Other chronic pancreatitis: Secondary | ICD-10-CM | POA: Diagnosis not present

## 2015-09-11 DIAGNOSIS — K219 Gastro-esophageal reflux disease without esophagitis: Secondary | ICD-10-CM | POA: Diagnosis not present

## 2015-09-11 DIAGNOSIS — Z5111 Encounter for antineoplastic chemotherapy: Secondary | ICD-10-CM | POA: Diagnosis not present

## 2015-09-11 DIAGNOSIS — Z800 Family history of malignant neoplasm of digestive organs: Secondary | ICD-10-CM | POA: Insufficient documentation

## 2015-09-11 DIAGNOSIS — C7931 Secondary malignant neoplasm of brain: Secondary | ICD-10-CM | POA: Diagnosis not present

## 2015-09-11 DIAGNOSIS — F17210 Nicotine dependence, cigarettes, uncomplicated: Secondary | ICD-10-CM | POA: Diagnosis not present

## 2015-09-11 DIAGNOSIS — B370 Candidal stomatitis: Secondary | ICD-10-CM | POA: Diagnosis not present

## 2015-09-11 MED ORDER — SODIUM CHLORIDE 0.9 % IV SOLN
Freq: Once | INTRAVENOUS | Status: AC
  Administered 2015-09-11: 14:00:00 via INTRAVENOUS
  Filled 2015-09-11: qty 1000

## 2015-09-11 MED ORDER — SODIUM CHLORIDE 0.9 % IV SOLN
Freq: Once | INTRAVENOUS | Status: AC
  Administered 2015-09-11: 15:00:00 via INTRAVENOUS
  Filled 2015-09-11: qty 4

## 2015-09-14 ENCOUNTER — Encounter: Payer: Self-pay | Admitting: Radiation Oncology

## 2015-09-14 ENCOUNTER — Ambulatory Visit
Admission: RE | Admit: 2015-09-14 | Discharge: 2015-09-14 | Disposition: A | Discharge status: Home / Self Care | Payer: BLUE CROSS/BLUE SHIELD | Source: Ambulatory Visit | Attending: Radiation Oncology | Admitting: Radiation Oncology

## 2015-09-14 ENCOUNTER — Ambulatory Visit: Payer: BLUE CROSS/BLUE SHIELD

## 2015-09-14 VITALS — BP 124/79 | HR 87 | Temp 98.2°F | Resp 20 | Wt 138.8 lb

## 2015-09-14 DIAGNOSIS — C3411 Malignant neoplasm of upper lobe, right bronchus or lung: Secondary | ICD-10-CM

## 2015-09-14 NOTE — Progress Notes (Signed)
Radiation Oncology Follow up Note  Name: Tiffany Erickson   Date:   09/14/2015 MRN:  [id number] DOB: 1970-07-24    This 45 y.o. female presents to the clinic today for short treatment break from palliative radiation therapy to her chest.  REFERRING PROVIDER: No ref. provider found  HPI: Patient is a 45 year old female with stage for (T2 N2 M1) non-small cell lung cancer with a solitary brain metastasis. She's completed a large field chest and also received hypofractionated course of radiation to her brain for solitary brain metastasis. She continues to have some dysphasia has been taking her Carafate as a pill form even though I asked her to dissolve in water. She is also being tapered off her steroids. She also has a mild nonproductive cough feels quite weak and washed out..  COMPLICATIONS OF TREATMENT: none  FOLLOW UP COMPLIANCE: keeps appointments   PHYSICAL EXAM:  BP 124/79 mmHg  Pulse 87  Temp(Src) 98.2 F (36.8 C)  Resp 20  Wt 138 lb 12.5 oz (62.95 kg) Neurologic status is unchanged. Well-developed well-nourished patient in NAD. HEENT reveals PERLA, EOMI, discs not visualized.  Oral cavity is clear. No oral mucosal lesions are identified. Neck is clear without evidence of cervical or supraclavicular adenopathy. Lungs are clear to A&P. Cardiac examination is essentially unremarkable with regular rate and rhythm without murmur rub or thrill. Abdomen is benign with no organomegaly or masses noted. Motor sensory and DTR levels are equal and symmetric in the upper and lower extremities. Cranial nerves II through XII are grossly intact. Proprioception is intact. No peripheral adenopathy or edema is identified. No motor or sensory levels are noted. Crude visual fields are within normal range.  RADIOLOGY RESULTS: Have ordered a CT scan for small field lung boost which I will review  PLAN: At this time patient is doing fairly well liked go ahead with a small field lung boost. I've ordered a CT  scan simulation and we'll evaluate those films for response and discuss that with the patient and her mom prior to initiating a small field course of radiation. Would like to go to another 2600 cGy. I have set up and ordered CT scintillation for later this week. We'll coordinate her continued chemotherapy with medical oncology. May have to stop tapering her steroids and continue her on her small daily dose to avoid some the radiation esophagitis. All of this was explained in detail to the patient and her mom.  I would like to take this opportunity for allowing me to participate in the care of your patient.Armstead Peaks., MD

## 2015-09-15 ENCOUNTER — Ambulatory Visit: Payer: BLUE CROSS/BLUE SHIELD

## 2015-09-15 ENCOUNTER — Ambulatory Visit
Admission: RE | Admit: 2015-09-15 | Discharge: 2015-09-15 | Disposition: A | Discharge status: Home / Self Care | Payer: BLUE CROSS/BLUE SHIELD | Source: Ambulatory Visit | Attending: Radiation Oncology | Admitting: Radiation Oncology

## 2015-09-15 DIAGNOSIS — C3411 Malignant neoplasm of upper lobe, right bronchus or lung: Secondary | ICD-10-CM | POA: Diagnosis not present

## 2015-09-16 ENCOUNTER — Ambulatory Visit: Payer: BLUE CROSS/BLUE SHIELD

## 2015-09-16 DIAGNOSIS — C3411 Malignant neoplasm of upper lobe, right bronchus or lung: Secondary | ICD-10-CM | POA: Diagnosis not present

## 2015-09-17 ENCOUNTER — Ambulatory Visit: Payer: BLUE CROSS/BLUE SHIELD

## 2015-09-18 ENCOUNTER — Inpatient Hospital Stay (HOSPITAL_BASED_OUTPATIENT_CLINIC_OR_DEPARTMENT_OTHER): Payer: BLUE CROSS/BLUE SHIELD | Admitting: Internal Medicine

## 2015-09-18 ENCOUNTER — Inpatient Hospital Stay: Payer: BLUE CROSS/BLUE SHIELD

## 2015-09-18 ENCOUNTER — Ambulatory Visit: Payer: BLUE CROSS/BLUE SHIELD

## 2015-09-18 VITALS — BP 123/81 | HR 102 | Temp 97.2°F | Resp 20 | Ht 64.1 in | Wt 142.9 lb

## 2015-09-18 DIAGNOSIS — K861 Other chronic pancreatitis: Secondary | ICD-10-CM

## 2015-09-18 DIAGNOSIS — C7931 Secondary malignant neoplasm of brain: Secondary | ICD-10-CM | POA: Diagnosis not present

## 2015-09-18 DIAGNOSIS — C771 Secondary and unspecified malignant neoplasm of intrathoracic lymph nodes: Secondary | ICD-10-CM | POA: Diagnosis not present

## 2015-09-18 DIAGNOSIS — C3411 Malignant neoplasm of upper lobe, right bronchus or lung: Secondary | ICD-10-CM

## 2015-09-18 DIAGNOSIS — F17210 Nicotine dependence, cigarettes, uncomplicated: Secondary | ICD-10-CM

## 2015-09-18 DIAGNOSIS — R1310 Dysphagia, unspecified: Secondary | ICD-10-CM

## 2015-09-18 DIAGNOSIS — K219 Gastro-esophageal reflux disease without esophagitis: Secondary | ICD-10-CM

## 2015-09-18 DIAGNOSIS — Z79899 Other long term (current) drug therapy: Secondary | ICD-10-CM

## 2015-09-18 DIAGNOSIS — B370 Candidal stomatitis: Secondary | ICD-10-CM | POA: Diagnosis not present

## 2015-09-18 DIAGNOSIS — R0602 Shortness of breath: Secondary | ICD-10-CM

## 2015-09-18 DIAGNOSIS — R5383 Other fatigue: Secondary | ICD-10-CM

## 2015-09-18 DIAGNOSIS — B3781 Candidal esophagitis: Principal | ICD-10-CM

## 2015-09-18 LAB — COMPREHENSIVE METABOLIC PANEL
ALK PHOS: 64 U/L (ref 38–126)
ALT: 20 U/L (ref 14–54)
AST: 20 U/L (ref 15–41)
Albumin: 3.7 g/dL (ref 3.5–5.0)
Anion gap: 6 (ref 5–15)
BUN: 14 mg/dL (ref 6–20)
CALCIUM: 8.7 mg/dL — AB (ref 8.9–10.3)
CO2: 30 mmol/L (ref 22–32)
CREATININE: 0.69 mg/dL (ref 0.44–1.00)
Chloride: 103 mmol/L (ref 101–111)
GFR calc non Af Amer: >60 mL/min (ref >60)
GLUCOSE: 128 mg/dL — AB (ref 65–99)
Potassium: 3.6 mmol/L (ref 3.5–5.1)
SODIUM: 139 mmol/L (ref 135–145)
Total Bilirubin: 0.4 mg/dL (ref 0.3–1.2)
Total Protein: 6.5 g/dL (ref 6.5–8.1)

## 2015-09-18 LAB — CBC WITH DIFFERENTIAL/PLATELET
BASOS ABS: 0 10*3/uL (ref 0–0.1)
Basophils Relative: 0 %
EOS ABS: 0 10*3/uL (ref 0–0.7)
Eosinophils Relative: 0 %
HCT: 34.1 % — ABNORMAL LOW (ref 35.0–47.0)
Hemoglobin: 11.8 g/dL — ABNORMAL LOW (ref 12.0–16.0)
LYMPHS ABS: 0.6 10*3/uL — AB (ref 1.0–3.6)
LYMPHS PCT: 14 %
MCH: 34.5 pg — AB (ref 26.0–34.0)
MCHC: 34.7 g/dL (ref 32.0–36.0)
MCV: 99.5 fL (ref 80.0–100.0)
Monocytes Absolute: 0.5 10*3/uL (ref 0.2–0.9)
Monocytes Relative: 10 %
Neutro Abs: 3.6 10*3/uL (ref 1.4–6.5)
Neutrophils Relative %: 76 %
Platelets: 221 10*3/uL (ref 150–440)
RBC: 3.43 MIL/uL — AB (ref 3.80–5.20)
RDW: 18.4 % — ABNORMAL HIGH (ref 11.5–14.5)
WBC: 4.7 10*3/uL (ref 3.6–11.0)

## 2015-09-18 MED ORDER — HEPARIN SOD (PORK) LOCK FLUSH 100 UNIT/ML IV SOLN
500.0000 [IU] | Freq: Once | INTRAVENOUS | Status: AC
  Administered 2015-09-18: 500 [IU] via INTRAVENOUS
  Filled 2015-09-18: qty 5

## 2015-09-18 MED ORDER — FAMOTIDINE IN NACL 20-0.9 MG/50ML-% IV SOLN
20.0000 mg | Freq: Once | INTRAVENOUS | Status: AC
  Administered 2015-09-18: 20 mg via INTRAVENOUS
  Filled 2015-09-18: qty 50

## 2015-09-18 MED ORDER — NYSTATIN 100000 UNIT/ML MT SUSP: 5.0000 mL | Freq: Four times a day (QID) | OROMUCOSAL | Status: DC

## 2015-09-18 MED ORDER — DIPHENHYDRAMINE HCL 50 MG/ML IJ SOLN
50.0000 mg | Freq: Once | INTRAMUSCULAR | Status: AC
  Administered 2015-09-18: 50 mg via INTRAVENOUS
  Filled 2015-09-18: qty 1

## 2015-09-18 MED ORDER — SODIUM CHLORIDE 0.9 % IV SOLN
20.0000 mg | Freq: Once | INTRAVENOUS | Status: AC
  Administered 2015-09-18: 20 mg via INTRAVENOUS
  Filled 2015-09-18: qty 2

## 2015-09-18 MED ORDER — SODIUM CHLORIDE 0.9% FLUSH
10.0000 mL | INTRAVENOUS | Status: DC | PRN
  Administered 2015-09-18: 10 mL via INTRAVENOUS
  Filled 2015-09-18: qty 10

## 2015-09-18 MED ORDER — PACLITAXEL CHEMO INJECTION 300 MG/50ML
45.0000 mg/m2 | Freq: Once | INTRAVENOUS | Status: AC
  Administered 2015-09-18: 72 mg via INTRAVENOUS
  Filled 2015-09-18: qty 12

## 2015-09-18 MED ORDER — SODIUM CHLORIDE 0.9 % IV SOLN
229.0000 mg | Freq: Once | INTRAVENOUS | Status: AC
  Administered 2015-09-18: 230 mg via INTRAVENOUS
  Filled 2015-09-18: qty 23

## 2015-09-18 MED ORDER — SODIUM CHLORIDE 0.9 % IV SOLN
Freq: Once | INTRAVENOUS | Status: AC
  Administered 2015-09-18: 10:00:00 via INTRAVENOUS
  Filled 2015-09-18: qty 1000

## 2015-09-18 MED ORDER — PALONOSETRON HCL INJECTION 0.25 MG/5ML
0.2500 mg | Freq: Once | INTRAVENOUS | Status: AC
  Administered 2015-09-18: 0.25 mg via INTRAVENOUS
  Filled 2015-09-18: qty 5

## 2015-09-18 NOTE — Progress Notes (Signed)
Patient ambulates independently without assistance.  Brought to exam room 6, vitals documented.  Medication record updated information provided by patient.  Patient c/o SOB, stated SOB started 2 days ago on 09/16/15, Dr. Ree Kida.

## 2015-09-18 NOTE — Progress Notes (Signed)
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 PET- no distant mets; March 3rd START CARBO-TAXOL Weekly +RT [until may 5th?]  # FEB 2017- MRI Brain 8m focus of met [Right frontal]  # "Chronic pancreatitis"/ smoker [15ppt]  HISTORY OF PRESENTING ILLNESS:  Tiffany Erickson 45y.o.  female newly diagnosed adenocarcinoma of the right lung-currently on concurrent chemoradiation s/p 5 cycles of chemo-RT; is here to proceed with cycle #6 of carbotaxol along with radiation.   Patient had a week off radiation chemotherapy/last week. She had gone to a bike show.  As per the patient/Dr. CDonella Stade she had a good response to chemotherapy so far.   She complains of difficulty swallowing pain was swallowing. She is on Carafate. She is not using magic mouthwash. She is on Protonix once a day. She complains of abdominal discomfort/ prior history of pancreatitis/however the pain is not that intense. She feels her abdomen is bloated.  Poor appetite. Poor by mouth intake. Complains of extreme fatigue. Complains of shortness of breath with exertion. No cough or hemoptysis. She denies any headaches. No swelling in the legs or arms. No hoarseness of voice. No vision changes or double vision.   ROS: A complete 10 point review of system is done which is negative except mentioned above in history of present illness  MEDICAL HISTORY:  Past Medical History  Diagnosis Date  . [place]   . Multiple fractures of ribs of right side 06/01/2015    "5" S/P fall  LEFT SIDE  . Concussion 06/01/2015    S/P fall-PT STATES IN PHONE INTERVIEW THAT SHE IS UNSURE IF SHE HAD A CONCUSSION OR NOT  . Lung mass     right side noted 06/01/2015  . Anxiety   . GERD (gastroesophageal reflux disease)   . Frequent UTI     "sometimes"  (06/01/2015)  . Shortness of breath dyspnea     5 rib fx right side  . [place]    METASTATIC ADENOCARCINOMA OF THE LUNG  . Broken ribs 05/2015    5 ribs    SURGICAL HISTORY: Past Surgical History  Procedure Laterality Date  . Tonsillectomy    . Myringotomy with tube placement Bilateral   . Eye muscle surgery Left ~ 1979  . Esophagogastroduodenoscopy    . Endobronchial ultrasound N/A 07/13/2015    Procedure: ENDOBRONCHIAL ULTRASOUND;  Surgeon: PLaverle Hobby MD;  Location: [facility] ORS;  Service: Pulmonary;  Laterality: N/A;  . Portacath placement Left 07/27/2015    Procedure: INSERTION PORT-A-CATH;  Surgeon: TNestor Lewandowsky MD;  Location: [facility] ORS;  Service: General;  Laterality: Left;    SOCIAL HISTORY: Social History   Social History  . Marital Status: Single    Spouse Name: N/A  . Number of Children: N/A  . Years of Education: N/A   Occupational History  . Not on file.   Social History Main Topics  . Smoking status: Current Every Day Smoker -- 0.50 packs/day for 28 years    Types: Cigarettes  . Smokeless tobacco: Never Used  . Alcohol Use: 8.4 oz/week    2 Glasses of wine, 12 Cans of beer per week     Comment: 06/01/2015 "drink probably 4 times/wk; either ;4 beers or 2 glasses of wine when I do drink"  . Drug Use: No  .  Sexual Activity: Not Currently   Other Topics Concern  . Not on file   Social History Narrative    FAMILY HISTORY: Family History  Problem Relation Age of Onset  . Colon cancer Maternal Grandfather   . CAD Maternal Grandfather   . Pancreatic cancer Maternal Grandmother     ALLERGIES:  is allergic to sulfa antibiotics.  MEDICATIONS:  Current Outpatient Prescriptions  Medication Sig Dispense Refill  . ALPRAZolam (XANAX) 1 MG tablet Take 0.5-1 mg by mouth 3 (three) times daily as needed for anxiety.     . benzonatate (TESSALON) 200 MG capsule TK ONE C PO BID  2  . gabapentin (NEURONTIN) 100 MG capsule Take 1 capsule (100  mg total) by mouth 3 (three) times daily. 90 capsule 2  . lidocaine-prilocaine (EMLA) cream Apply 1 application topically as needed. 30 g 6  . metoCLOPramide (REGLAN) 10 MG tablet Take 1 tablet (10 mg total) by mouth every 8 (eight) hours as needed for nausea or vomiting. 10 tablet 0  . ondansetron (ZOFRAN) 8 MG tablet Take 1 tablet (8 mg total) by mouth every 8 (eight) hours as needed for nausea or vomiting (start 3 days; after chemo). 40 tablet 0  . pantoprazole (PROTONIX) 20 MG tablet Take 20 mg by mouth at bedtime.     . prochlorperazine (COMPAZINE) 10 MG tablet Take 1 tablet (10 mg total) by mouth every 6 (six) hours as needed for nausea or vomiting. 40 tablet 0  . promethazine (PHENERGAN) 25 MG tablet Take 25 mg by mouth every 6 (six) hours as needed for nausea or vomiting.    . sucralfate (CARAFATE) 1 g tablet Take 1 tablet (1 g total) by mouth 3 (three) times daily. 90 tablet 3  . dexamethasone (DECADRON) 4 MG tablet Take 1 tablet (4 mg total) by mouth daily. Take daily x 10 days then 1 tab every other day until finished. (Patient not taking: Reported on 09/18/2015) 15 tablet 0  . eszopiclone (LUNESTA) 1 MG TABS tablet Take 1 tablet (1 mg total) by mouth at bedtime as needed for sleep. Take immediately before bedtime (Patient not taking: Reported on 09/18/2015) 30 tablet 1  . HYDROcodone-acetaminophen (NORCO/VICODIN) 5-325 MG tablet One tablet every 6 - 8 hours as needed for pain (Patient not taking: Reported on 09/18/2015) 30 tablet 0  . hydrocortisone (CORTEF) 20 MG tablet   1  . magic mouthwash w/lidocaine SOLN Take 5 mLs by mouth 3 (three) times daily. (Patient not taking: Reported on 09/18/2015) 237 mL 2   No current facility-administered medications for this visit.      Marland Kitchen  PHYSICAL EXAMINATION: ECOG PERFORMANCE STATUS: 0 - Asymptomatic  Filed Vitals:   09/18/15 0920  BP: 123/81  Pulse: 102  Temp: 97.2 F (36.2 C)  Resp: 20   Filed Weights   09/18/15 0920  Weight: 142 lb  14.1 oz (64.81 kg)    GENERAL: Well-nourished well-developed; Alert, no distress and comfortable. She is alone.  EYES: no pallor or icterus OROPHARYNX: no thrush or ulceration; good dentition  NECK: supple, no masses felt LYMPH:  no palpable lymphadenopathy in the cervical, axillary or inguinal regions LUNGS: clear to auscultation and  No wheeze or crackles HEART/CVS: regular rate & rhythm and no murmurs; No lower extremity edema ABDOMEN: abdomen soft, non-tender and normal bowel sounds Musculoskeletal:no cyanosis of digits and no clubbing  PSYCH: alert & oriented x 3 with fluent speech; anxious NEURO: no focal motor/sensory deficits SKIN:  no rashes or significant  lesions; mediport- no signs of infection.   LABORATORY DATA:  I have reviewed the data as listed Lab Results  Component Value Date   WBC 4.7 09/18/2015   HGB 11.8* 09/18/2015   HCT 34.1* 09/18/2015   MCV 99.5 09/18/2015   PLT 221 09/18/2015    Recent Labs  08/21/15 0931 08/28/15 0834 09/04/15 0920 09/18/15 0856  NA 136 134* 134* 139  K 3.6 3.6 3.7 3.6  CL 102 101 102 103  CO2 _0 GLUCOSE 107* 108* 140* 128*  BUN _1 CREATININE 0.47 0.48 0.54 0.69  CALCIUM 8.4* 8.4* 8.8* 8.7*  GFRNONAA >60 >60 >60 >60  GFRAA >60 >60 >60 >60  PROT 6.8  --  7.3 6.5  ALBUMIN 3.4*  --  3.9 3.7  AST 41  --  28 20  ALT 35  --  28 20  ALKPHOS 103  --  99 64  BILITOT 0.5  --  0.5 0.4     ASSESSMENT & PLAN:    # STAGE IV [tiny focus of brain met] Adenocarcinoma T2N2; right upper lobe; currently on carbotaxol along with radiation- status post 5 treatments. Patient tolerating chemoradiation with mild-to-moderate difficulties.  # CBC CMP within normal limits. Proceed with cycle #6 today.  # nausea vomiting- discussed regarding taking Zofran/Compazine around-the-clock.   # THRUSH- Difficulty swallowing/ esophagitis/abdominal discomfort; patient on Carafate/.Emend twice a day PPI. Check lipase and amylase; also  add nystatin today.   #  3 mmsolitary/ brain metastases- Radiation/as per radiation oncology.  # Proceed with weekly chemotherapy along with radiation; follow-up one week for chemotherapy labs; follow-up with me in 2 weeks with chemotherapy labs.      Cammie Sickle, MD 09/18/2015 9:52 AM

## 2015-09-21 ENCOUNTER — Ambulatory Visit: Payer: BLUE CROSS/BLUE SHIELD

## 2015-09-22 ENCOUNTER — Ambulatory Visit: Payer: BLUE CROSS/BLUE SHIELD

## 2015-09-22 ENCOUNTER — Ambulatory Visit
Admission: RE | Admit: 2015-09-22 | Discharge: 2015-09-22 | Disposition: A | Discharge status: Home / Self Care | Payer: BLUE CROSS/BLUE SHIELD | Source: Ambulatory Visit | Attending: Radiation Oncology | Admitting: Radiation Oncology

## 2015-09-22 DIAGNOSIS — C3411 Malignant neoplasm of upper lobe, right bronchus or lung: Secondary | ICD-10-CM | POA: Diagnosis not present

## 2015-09-23 ENCOUNTER — Ambulatory Visit
Admission: RE | Admit: 2015-09-23 | Discharge: 2015-09-23 | Disposition: A | Discharge status: Home / Self Care | Payer: BLUE CROSS/BLUE SHIELD | Source: Ambulatory Visit | Attending: Radiation Oncology | Admitting: Radiation Oncology

## 2015-09-23 ENCOUNTER — Ambulatory Visit: Payer: BLUE CROSS/BLUE SHIELD

## 2015-09-23 ENCOUNTER — Other Ambulatory Visit: Payer: Self-pay | Admitting: *Deleted

## 2015-09-23 DIAGNOSIS — C3411 Malignant neoplasm of upper lobe, right bronchus or lung: Secondary | ICD-10-CM | POA: Diagnosis not present

## 2015-09-24 ENCOUNTER — Ambulatory Visit
Admission: RE | Admit: 2015-09-24 | Discharge: 2015-09-24 | Disposition: A | Discharge status: Home / Self Care | Payer: BLUE CROSS/BLUE SHIELD | Source: Ambulatory Visit | Attending: Radiation Oncology | Admitting: Radiation Oncology

## 2015-09-24 ENCOUNTER — Ambulatory Visit: Payer: BLUE CROSS/BLUE SHIELD

## 2015-09-24 DIAGNOSIS — C3411 Malignant neoplasm of upper lobe, right bronchus or lung: Secondary | ICD-10-CM | POA: Diagnosis not present

## 2015-09-25 ENCOUNTER — Other Ambulatory Visit: Payer: Self-pay | Admitting: Oncology

## 2015-09-25 ENCOUNTER — Other Ambulatory Visit: Payer: Self-pay | Admitting: *Deleted

## 2015-09-25 ENCOUNTER — Ambulatory Visit
Admission: RE | Admit: 2015-09-25 | Discharge: 2015-09-25 | Disposition: A | Discharge status: Home / Self Care | Payer: BLUE CROSS/BLUE SHIELD | Source: Ambulatory Visit | Attending: Radiation Oncology | Admitting: Radiation Oncology

## 2015-09-25 ENCOUNTER — Telehealth: Payer: Self-pay | Admitting: *Deleted

## 2015-09-25 ENCOUNTER — Other Ambulatory Visit: Payer: Self-pay | Admitting: Internal Medicine

## 2015-09-25 ENCOUNTER — Inpatient Hospital Stay: Payer: BLUE CROSS/BLUE SHIELD

## 2015-09-25 VITALS — BP 123/83 | HR 103 | Temp 97.4°F

## 2015-09-25 DIAGNOSIS — C3411 Malignant neoplasm of upper lobe, right bronchus or lung: Secondary | ICD-10-CM | POA: Diagnosis not present

## 2015-09-25 DIAGNOSIS — E876 Hypokalemia: Secondary | ICD-10-CM

## 2015-09-25 DIAGNOSIS — C801 Malignant (primary) neoplasm, unspecified: Secondary | ICD-10-CM

## 2015-09-25 LAB — BASIC METABOLIC PANEL
ANION GAP: 7 (ref 5–15)
BUN: 12 mg/dL (ref 6–20)
CALCIUM: 8.4 mg/dL — AB (ref 8.9–10.3)
CO2: 27 mmol/L (ref 22–32)
Chloride: 103 mmol/L (ref 101–111)
Creatinine, Ser: 0.5 mg/dL (ref 0.44–1.00)
Glucose, Bld: 86 mg/dL (ref 65–99)
Potassium: 2.9 mmol/L — CL (ref 3.5–5.1)
Sodium: 137 mmol/L (ref 135–145)

## 2015-09-25 LAB — CBC WITH DIFFERENTIAL/PLATELET
BASOS ABS: 0 10*3/uL (ref 0–0.1)
BASOS PCT: 0 %
EOS PCT: 1 %
Eosinophils Absolute: 0 10*3/uL (ref 0–0.7)
HCT: 32.5 % — ABNORMAL LOW (ref 35.0–47.0)
Hemoglobin: 11.3 g/dL — ABNORMAL LOW (ref 12.0–16.0)
Lymphocytes Relative: 35 %
Lymphs Abs: 1.2 10*3/uL (ref 1.0–3.6)
MCH: 34.6 pg — ABNORMAL HIGH (ref 26.0–34.0)
MCHC: 34.8 g/dL (ref 32.0–36.0)
MCV: 99.5 fL (ref 80.0–100.0)
MONO ABS: 0.3 10*3/uL (ref 0.2–0.9)
Monocytes Relative: 9 %
NEUTROS ABS: 1.9 10*3/uL (ref 1.4–6.5)
Neutrophils Relative %: 55 %
PLATELETS: 205 10*3/uL (ref 150–440)
RBC: 3.26 MIL/uL — ABNORMAL LOW (ref 3.80–5.20)
RDW: 18.9 % — AB (ref 11.5–14.5)
WBC: 3.5 10*3/uL — ABNORMAL LOW (ref 3.6–11.0)

## 2015-09-25 MED ORDER — SODIUM CHLORIDE 0.9 % IV SOLN
40.0000 meq | Freq: Once | INTRAVENOUS | Status: AC
  Administered 2015-09-25: 40 meq via INTRAVENOUS
  Filled 2015-09-25: qty 20

## 2015-09-25 MED ORDER — SODIUM CHLORIDE 0.9 % IV SOLN
229.0000 mg | Freq: Once | INTRAVENOUS | Status: AC
  Administered 2015-09-25: 230 mg via INTRAVENOUS
  Filled 2015-09-25: qty 23

## 2015-09-25 MED ORDER — DIPHENHYDRAMINE HCL 50 MG/ML IJ SOLN
50.0000 mg | Freq: Once | INTRAMUSCULAR | Status: AC
  Administered 2015-09-25: 50 mg via INTRAVENOUS
  Filled 2015-09-25: qty 1

## 2015-09-25 MED ORDER — POTASSIUM CHLORIDE 2 MEQ/ML IV SOLN: 40.0000 meq | Freq: Once | INTRAVENOUS | Status: DC

## 2015-09-25 MED ORDER — PALONOSETRON HCL INJECTION 0.25 MG/5ML
0.2500 mg | Freq: Once | INTRAVENOUS | Status: AC
  Administered 2015-09-25: 0.25 mg via INTRAVENOUS
  Filled 2015-09-25: qty 5

## 2015-09-25 MED ORDER — PACLITAXEL CHEMO INJECTION 300 MG/50ML
45.0000 mg/m2 | Freq: Once | INTRAVENOUS | Status: AC
  Administered 2015-09-25: 72 mg via INTRAVENOUS
  Filled 2015-09-25: qty 12

## 2015-09-25 MED ORDER — POTASSIUM CHLORIDE CRYS ER 20 MEQ PO TBCR: 40.0000 meq | EXTENDED_RELEASE_TABLET | Freq: Every day | ORAL | Status: DC

## 2015-09-25 MED ORDER — DEXAMETHASONE SODIUM PHOSPHATE 100 MG/10ML IJ SOLN
20.0000 mg | Freq: Once | INTRAMUSCULAR | Status: AC
  Administered 2015-09-25: 20 mg via INTRAVENOUS
  Filled 2015-09-25: qty 2

## 2015-09-25 MED ORDER — FAMOTIDINE IN NACL 20-0.9 MG/50ML-% IV SOLN
20.0000 mg | Freq: Once | INTRAVENOUS | Status: AC
  Administered 2015-09-25: 20 mg via INTRAVENOUS
  Filled 2015-09-25: qty 50

## 2015-09-25 MED ORDER — SODIUM CHLORIDE 0.9 % IV SOLN
Freq: Once | INTRAVENOUS | Status: AC
  Administered 2015-09-25: 10:00:00 via INTRAVENOUS
  Filled 2015-09-25: qty 1000

## 2015-09-25 MED ORDER — SODIUM CHLORIDE 0.9% FLUSH
10.0000 mL | INTRAVENOUS | Status: DC | PRN
  Administered 2015-09-25: 10 mL via INTRAVENOUS
  Filled 2015-09-25: qty 10

## 2015-09-25 MED ORDER — HEPARIN SOD (PORK) LOCK FLUSH 100 UNIT/ML IV SOLN
500.0000 [IU] | Freq: Once | INTRAVENOUS | Status: AC | PRN
  Administered 2015-09-25: 500 [IU]

## 2015-09-25 NOTE — Telephone Encounter (Signed)
Critical Lab - K+ 2.9  MD notified.

## 2015-09-26 ENCOUNTER — Other Ambulatory Visit: Payer: Self-pay | Admitting: Internal Medicine

## 2015-09-28 ENCOUNTER — Ambulatory Visit
Admission: RE | Admit: 2015-09-28 | Discharge: 2015-09-28 | Disposition: A | Discharge status: Home / Self Care | Payer: BLUE CROSS/BLUE SHIELD | Source: Ambulatory Visit | Attending: Radiation Oncology | Admitting: Radiation Oncology

## 2015-09-28 DIAGNOSIS — C3411 Malignant neoplasm of upper lobe, right bronchus or lung: Secondary | ICD-10-CM | POA: Diagnosis not present

## 2015-09-29 ENCOUNTER — Ambulatory Visit
Admission: RE | Admit: 2015-09-29 | Discharge: 2015-09-29 | Disposition: A | Discharge status: Home / Self Care | Payer: BLUE CROSS/BLUE SHIELD | Source: Ambulatory Visit | Attending: Radiation Oncology | Admitting: Radiation Oncology

## 2015-09-29 DIAGNOSIS — C3411 Malignant neoplasm of upper lobe, right bronchus or lung: Secondary | ICD-10-CM | POA: Diagnosis not present

## 2015-09-30 ENCOUNTER — Ambulatory Visit
Admission: RE | Admit: 2015-09-30 | Discharge: 2015-09-30 | Disposition: A | Discharge status: Home / Self Care | Payer: BLUE CROSS/BLUE SHIELD | Source: Ambulatory Visit | Attending: Radiation Oncology | Admitting: Radiation Oncology

## 2015-09-30 DIAGNOSIS — C3411 Malignant neoplasm of upper lobe, right bronchus or lung: Secondary | ICD-10-CM | POA: Diagnosis not present

## 2015-10-01 ENCOUNTER — Inpatient Hospital Stay: Payer: BLUE CROSS/BLUE SHIELD

## 2015-10-01 ENCOUNTER — Inpatient Hospital Stay: Payer: BLUE CROSS/BLUE SHIELD | Admitting: Internal Medicine

## 2015-10-01 ENCOUNTER — Ambulatory Visit
Admission: RE | Admit: 2015-10-01 | Discharge: 2015-10-01 | Disposition: A | Discharge status: Home / Self Care | Payer: BLUE CROSS/BLUE SHIELD | Source: Ambulatory Visit | Attending: Radiation Oncology | Admitting: Radiation Oncology

## 2015-10-01 DIAGNOSIS — C3411 Malignant neoplasm of upper lobe, right bronchus or lung: Secondary | ICD-10-CM | POA: Diagnosis not present

## 2015-10-02 ENCOUNTER — Ambulatory Visit: Payer: BLUE CROSS/BLUE SHIELD

## 2015-10-05 ENCOUNTER — Ambulatory Visit
Admission: RE | Admit: 2015-10-05 | Discharge: 2015-10-05 | Disposition: A | Discharge status: Home / Self Care | Payer: BLUE CROSS/BLUE SHIELD | Source: Ambulatory Visit | Attending: Radiation Oncology | Admitting: Radiation Oncology

## 2015-10-05 DIAGNOSIS — C3411 Malignant neoplasm of upper lobe, right bronchus or lung: Secondary | ICD-10-CM | POA: Diagnosis not present

## 2015-10-06 ENCOUNTER — Ambulatory Visit
Admission: RE | Admit: 2015-10-06 | Discharge: 2015-10-06 | Disposition: A | Discharge status: Home / Self Care | Payer: BLUE CROSS/BLUE SHIELD | Source: Ambulatory Visit | Attending: Radiation Oncology | Admitting: Radiation Oncology

## 2015-10-06 DIAGNOSIS — C3411 Malignant neoplasm of upper lobe, right bronchus or lung: Secondary | ICD-10-CM | POA: Diagnosis not present

## 2015-10-07 ENCOUNTER — Ambulatory Visit
Admission: RE | Admit: 2015-10-07 | Discharge: 2015-10-07 | Disposition: A | Discharge status: Home / Self Care | Payer: BLUE CROSS/BLUE SHIELD | Source: Ambulatory Visit | Attending: Radiation Oncology | Admitting: Radiation Oncology

## 2015-10-07 DIAGNOSIS — C3411 Malignant neoplasm of upper lobe, right bronchus or lung: Secondary | ICD-10-CM | POA: Diagnosis not present

## 2015-10-08 ENCOUNTER — Ambulatory Visit
Admission: RE | Admit: 2015-10-08 | Discharge: 2015-10-08 | Disposition: A | Discharge status: Home / Self Care | Payer: BLUE CROSS/BLUE SHIELD | Source: Ambulatory Visit | Attending: Radiation Oncology | Admitting: Radiation Oncology

## 2015-10-08 DIAGNOSIS — C3411 Malignant neoplasm of upper lobe, right bronchus or lung: Secondary | ICD-10-CM | POA: Diagnosis not present

## 2015-10-09 ENCOUNTER — Other Ambulatory Visit: Payer: Self-pay | Admitting: Internal Medicine

## 2015-10-09 ENCOUNTER — Ambulatory Visit
Admission: RE | Admit: 2015-10-09 | Discharge: 2015-10-09 | Disposition: A | Discharge status: Home / Self Care | Payer: BLUE CROSS/BLUE SHIELD | Source: Ambulatory Visit | Attending: Radiation Oncology | Admitting: Radiation Oncology

## 2015-10-09 ENCOUNTER — Inpatient Hospital Stay: Payer: BLUE CROSS/BLUE SHIELD

## 2015-10-09 ENCOUNTER — Ambulatory Visit: Payer: BLUE CROSS/BLUE SHIELD

## 2015-10-09 ENCOUNTER — Inpatient Hospital Stay: Payer: BLUE CROSS/BLUE SHIELD | Attending: Internal Medicine | Admitting: Internal Medicine

## 2015-10-09 VITALS — BP 129/30 | HR 125 | Temp 96.8°F | Resp 18 | Wt 144.2 lb

## 2015-10-09 VITALS — BP 106/74 | HR 103 | Resp 18

## 2015-10-09 DIAGNOSIS — Z800 Family history of malignant neoplasm of digestive organs: Secondary | ICD-10-CM | POA: Diagnosis not present

## 2015-10-09 DIAGNOSIS — L598 Other specified disorders of the skin and subcutaneous tissue related to radiation: Secondary | ICD-10-CM | POA: Diagnosis not present

## 2015-10-09 DIAGNOSIS — Z95828 Presence of other vascular implants and grafts: Secondary | ICD-10-CM | POA: Diagnosis not present

## 2015-10-09 DIAGNOSIS — C3411 Malignant neoplasm of upper lobe, right bronchus or lung: Secondary | ICD-10-CM

## 2015-10-09 DIAGNOSIS — Z923 Personal history of irradiation: Secondary | ICD-10-CM | POA: Insufficient documentation

## 2015-10-09 DIAGNOSIS — Z87440 Personal history of urinary (tract) infections: Secondary | ICD-10-CM | POA: Insufficient documentation

## 2015-10-09 DIAGNOSIS — R0602 Shortness of breath: Secondary | ICD-10-CM | POA: Diagnosis not present

## 2015-10-09 DIAGNOSIS — Z5111 Encounter for antineoplastic chemotherapy: Secondary | ICD-10-CM | POA: Diagnosis not present

## 2015-10-09 DIAGNOSIS — R5383 Other fatigue: Secondary | ICD-10-CM | POA: Diagnosis not present

## 2015-10-09 DIAGNOSIS — R531 Weakness: Secondary | ICD-10-CM | POA: Insufficient documentation

## 2015-10-09 DIAGNOSIS — R200 Anesthesia of skin: Secondary | ICD-10-CM | POA: Diagnosis not present

## 2015-10-09 DIAGNOSIS — R1310 Dysphagia, unspecified: Secondary | ICD-10-CM | POA: Insufficient documentation

## 2015-10-09 DIAGNOSIS — K861 Other chronic pancreatitis: Secondary | ICD-10-CM | POA: Insufficient documentation

## 2015-10-09 DIAGNOSIS — C7931 Secondary malignant neoplasm of brain: Secondary | ICD-10-CM | POA: Insufficient documentation

## 2015-10-09 DIAGNOSIS — B370 Candidal stomatitis: Secondary | ICD-10-CM | POA: Insufficient documentation

## 2015-10-09 DIAGNOSIS — Z79899 Other long term (current) drug therapy: Secondary | ICD-10-CM | POA: Insufficient documentation

## 2015-10-09 DIAGNOSIS — R110 Nausea: Secondary | ICD-10-CM | POA: Diagnosis not present

## 2015-10-09 DIAGNOSIS — C771 Secondary and unspecified malignant neoplasm of intrathoracic lymph nodes: Secondary | ICD-10-CM | POA: Insufficient documentation

## 2015-10-09 DIAGNOSIS — K219 Gastro-esophageal reflux disease without esophagitis: Secondary | ICD-10-CM | POA: Diagnosis not present

## 2015-10-09 DIAGNOSIS — F17210 Nicotine dependence, cigarettes, uncomplicated: Secondary | ICD-10-CM | POA: Diagnosis not present

## 2015-10-09 DIAGNOSIS — E860 Dehydration: Secondary | ICD-10-CM

## 2015-10-09 DIAGNOSIS — F419 Anxiety disorder, unspecified: Secondary | ICD-10-CM | POA: Insufficient documentation

## 2015-10-09 LAB — COMPREHENSIVE METABOLIC PANEL
ALT: 25 U/L (ref 14–54)
ANION GAP: 10 (ref 5–15)
AST: 32 U/L (ref 15–41)
Albumin: 3.7 g/dL (ref 3.5–5.0)
Alkaline Phosphatase: 79 U/L (ref 38–126)
BILIRUBIN TOTAL: 0.5 mg/dL (ref 0.3–1.2)
BUN: <5 mg/dL — ABNORMAL LOW (ref 6–20)
CALCIUM: 8.6 mg/dL — AB (ref 8.9–10.3)
CHLORIDE: 105 mmol/L (ref 101–111)
CO2: 25 mmol/L (ref 22–32)
CREATININE: 0.62 mg/dL (ref 0.44–1.00)
Glucose, Bld: 100 mg/dL — ABNORMAL HIGH (ref 65–99)
POTASSIUM: 3.2 mmol/L — AB (ref 3.5–5.1)
Sodium: 140 mmol/L (ref 135–145)
Total Protein: 6.5 g/dL (ref 6.5–8.1)

## 2015-10-09 LAB — CBC WITH DIFFERENTIAL/PLATELET
BASOS ABS: 0 10*3/uL (ref 0–0.1)
BASOS PCT: 1 %
EOS ABS: 0 10*3/uL (ref 0–0.7)
EOS PCT: 1 %
HCT: 33 % — ABNORMAL LOW (ref 35.0–47.0)
Hemoglobin: 11.5 g/dL — ABNORMAL LOW (ref 12.0–16.0)
LYMPHS PCT: 34 %
Lymphs Abs: 1 10*3/uL (ref 1.0–3.6)
MCH: 35.6 pg — ABNORMAL HIGH (ref 26.0–34.0)
MCHC: 34.9 g/dL (ref 32.0–36.0)
MCV: 101.9 fL — AB (ref 80.0–100.0)
MONO ABS: 0.4 10*3/uL (ref 0.2–0.9)
Monocytes Relative: 14 %
Neutro Abs: 1.5 10*3/uL (ref 1.4–6.5)
Neutrophils Relative %: 50 %
PLATELETS: 262 10*3/uL (ref 150–440)
RBC: 3.24 MIL/uL — AB (ref 3.80–5.20)
RDW: 18.5 % — AB (ref 11.5–14.5)
WBC: 2.9 10*3/uL — AB (ref 3.6–11.0)

## 2015-10-09 MED ORDER — PACLITAXEL CHEMO INJECTION 300 MG/50ML
45.0000 mg/m2 | Freq: Once | INTRAVENOUS | Status: AC
  Administered 2015-10-09: 72 mg via INTRAVENOUS
  Filled 2015-10-09: qty 12

## 2015-10-09 MED ORDER — HYDROCODONE-ACETAMINOPHEN 5-325 MG PO TABS: ORAL_TABLET | ORAL | Status: DC

## 2015-10-09 MED ORDER — SODIUM CHLORIDE 0.9 % IV SOLN
229.0000 mg | Freq: Once | INTRAVENOUS | Status: AC
  Administered 2015-10-09: 230 mg via INTRAVENOUS
  Filled 2015-10-09: qty 23

## 2015-10-09 MED ORDER — HEPARIN SOD (PORK) LOCK FLUSH 100 UNIT/ML IV SOLN
500.0000 [IU] | Freq: Once | INTRAVENOUS | Status: AC
  Administered 2015-10-09: 500 [IU] via INTRAVENOUS
  Filled 2015-10-09: qty 5

## 2015-10-09 MED ORDER — SODIUM CHLORIDE 0.9% FLUSH
10.0000 mL | Freq: Once | INTRAVENOUS | Status: AC
  Administered 2015-10-09: 10 mL via INTRAVENOUS
  Filled 2015-10-09: qty 10

## 2015-10-09 MED ORDER — DIPHENHYDRAMINE HCL 50 MG/ML IJ SOLN
50.0000 mg | Freq: Once | INTRAMUSCULAR | Status: AC
  Administered 2015-10-09: 50 mg via INTRAVENOUS
  Filled 2015-10-09: qty 1

## 2015-10-09 MED ORDER — PALONOSETRON HCL INJECTION 0.25 MG/5ML
0.2500 mg | Freq: Once | INTRAVENOUS | Status: AC
  Administered 2015-10-09: 0.25 mg via INTRAVENOUS
  Filled 2015-10-09: qty 5

## 2015-10-09 MED ORDER — SODIUM CHLORIDE 0.9 % IV SOLN
Freq: Once | INTRAVENOUS | Status: AC
  Administered 2015-10-09: 10:00:00 via INTRAVENOUS
  Filled 2015-10-09: qty 1000

## 2015-10-09 MED ORDER — SODIUM CHLORIDE 0.9 % IV SOLN
20.0000 mg | Freq: Once | INTRAVENOUS | Status: AC
  Administered 2015-10-09: 20 mg via INTRAVENOUS
  Filled 2015-10-09: qty 2

## 2015-10-09 MED ORDER — SODIUM CHLORIDE 0.9 % IV SOLN
INTRAVENOUS | Status: DC
  Filled 2015-10-09: qty 1000

## 2015-10-09 MED ORDER — FAMOTIDINE IN NACL 20-0.9 MG/50ML-% IV SOLN
20.0000 mg | Freq: Once | INTRAVENOUS | Status: AC
  Administered 2015-10-09: 20 mg via INTRAVENOUS
  Filled 2015-10-09: qty 50

## 2015-10-09 MED ORDER — SODIUM CHLORIDE 0.9 % IV SOLN
Freq: Once | INTRAVENOUS | Status: AC
  Administered 2015-10-09: 11:00:00 via INTRAVENOUS
  Filled 2015-10-09: qty 1000

## 2015-10-09 NOTE — Progress Notes (Signed)
Patient complains of having pain at her radiation site.  States she is out of pain medication.

## 2015-10-09 NOTE — Progress Notes (Signed)
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 PET- no distant mets; March 3rd START CARBO-TAXOL Weekly +RT [until may 5th?]  # FEB 2017- MRI Brain 73m focus of met [Right frontal]  # "Chronic pancreatitis"/ smoker [15ppt]  HISTORY OF PRESENTING ILLNESS:  Tiffany Erickson 45y.o.  female newly diagnosed adenocarcinoma of the right lung-currently on concurrent chemoradiation s/p 5 cycles of chemo-RT; is here to proceed with cycle #8 of carbotaxol along with radiation.   Missed treatment last week.   Patient complains of mild tingling and numbness in her extremities. It does not interfere with her daily life. She complains of pain on the chest at the site of radiation.  Difficulty swallowing pain with swallowing has improved. However she is concerned about "flaring up of her pancreatitis"- she feels more nauseous. No vomiting. No abdominal pain. Patient has been taking Protonix on a once a day.  Complains of extreme fatigue. Complains of shortness of breath with exertion. No cough or hemoptysis. She denies any headaches. No swelling in the legs or arms. No hoarseness of voice. No vision changes or double vision.   ROS: A complete 10 point review of system is done which is negative except mentioned above in history of present illness  MEDICAL HISTORY:  Past Medical History  Diagnosis Date  . [place]   . Multiple fractures of ribs of right side 06/01/2015    "5" S/P fall  LEFT SIDE  . Concussion 06/01/2015    S/P fall-PT STATES IN PHONE INTERVIEW THAT SHE IS UNSURE IF SHE HAD A CONCUSSION OR NOT  . Lung mass     right side noted 06/01/2015  . Anxiety   . GERD (gastroesophageal reflux disease)   . Frequent UTI     "sometimes" (06/01/2015)  . Shortness of breath dyspnea     5 rib fx  right side  . [place]    METASTATIC ADENOCARCINOMA OF THE LUNG  . Broken ribs 05/2015    5 ribs    SURGICAL HISTORY: Past Surgical History  Procedure Laterality Date  . Tonsillectomy    . Myringotomy with tube placement Bilateral   . Eye muscle surgery Left ~ 1979  . Esophagogastroduodenoscopy    . Endobronchial ultrasound N/A 07/13/2015    Procedure: ENDOBRONCHIAL ULTRASOUND;  Surgeon: PLaverle Hobby MD;  Location: [facility] ORS;  Service: Pulmonary;  Laterality: N/A;  . Portacath placement Left 07/27/2015    Procedure: INSERTION PORT-A-CATH;  Surgeon: TNestor Lewandowsky MD;  Location: [facility] ORS;  Service: General;  Laterality: Left;    SOCIAL HISTORY: Social History   Social History  . Marital Status: Single    Spouse Name: N/A  . Number of Children: N/A  . Years of Education: N/A   Occupational History  . Not on file.   Social History Main Topics  . Smoking status: Current Every Day Smoker -- 0.50 packs/day for 28 years    Types: Cigarettes  . Smokeless tobacco: Never Used  . Alcohol Use: 8.4 oz/week    2 Glasses of wine, 12 Cans of beer per week     Comment: 06/01/2015 "drink probably 4 times/wk; either ;4 beers or 2 glasses of wine when I do drink"  . Drug Use: No  . Sexual Activity: Not Currently   Other Topics Concern  .  Not on file   Social History Narrative    FAMILY HISTORY: Family History  Problem Relation Age of Onset  . Colon cancer Maternal Grandfather   . CAD Maternal Grandfather   . Pancreatic cancer Maternal Grandmother     ALLERGIES:  is allergic to sulfa antibiotics.  MEDICATIONS:  Current Outpatient Prescriptions  Medication Sig Dispense Refill  . ALPRAZolam (XANAX) 1 MG tablet Take 0.5-1 mg by mouth 3 (three) times daily as needed for anxiety.     . benzonatate (TESSALON) 200 MG capsule TK ONE C PO BID  2  . dexamethasone (DECADRON) 4 MG tablet Take 1 tablet (4 mg total) by mouth daily. Take daily x 10 days then 1 tab every  other day until finished. 15 tablet 0  . HYDROcodone-acetaminophen (NORCO/VICODIN) 5-325 MG tablet One tablet every 6 - 8 hours as needed for pain 30 tablet 0  . hydrocortisone (CORTEF) 20 MG tablet   1  . lidocaine-prilocaine (EMLA) cream Apply 1 application topically as needed. 30 g 6  . metoCLOPramide (REGLAN) 10 MG tablet Take 1 tablet (10 mg total) by mouth every 8 (eight) hours as needed for nausea or vomiting. 10 tablet 0  . ondansetron (ZOFRAN) 8 MG tablet Take 1 tablet (8 mg total) by mouth every 8 (eight) hours as needed for nausea or vomiting (start 3 days; after chemo). 40 tablet 0  . pantoprazole (PROTONIX) 20 MG tablet Take 20 mg by mouth at bedtime.     . promethazine (PHENERGAN) 25 MG tablet Take 25 mg by mouth every 6 (six) hours as needed for nausea or vomiting. Reported on 10/09/2015    . gabapentin (NEURONTIN) 100 MG capsule Take 1 capsule (100 mg total) by mouth 3 (three) times daily. (Patient not taking: Reported on 10/09/2015) 90 capsule 2  . magic mouthwash w/lidocaine SOLN Take 5 mLs by mouth 3 (three) times daily. (Patient not taking: Reported on 10/09/2015) 237 mL 2  . nystatin (MYCOSTATIN) 100000 UNIT/ML suspension TAKE 5 ML BY MOUTH FOUR TIMES DAILY (Patient not taking: Reported on 10/09/2015) 250 mL 3  . potassium chloride SA (K-DUR,KLOR-CON) 20 MEQ tablet Take 2 tablets (40 mEq total) by mouth daily. (Patient not taking: Reported on 10/09/2015) 30 tablet 1  . prochlorperazine (COMPAZINE) 10 MG tablet Take 1 tablet (10 mg total) by mouth every 6 (six) hours as needed for nausea or vomiting. (Patient not taking: Reported on 10/09/2015) 40 tablet 0  . sucralfate (CARAFATE) 1 g tablet Take 1 tablet (1 g total) by mouth 3 (three) times daily. (Patient not taking: Reported on 10/09/2015) 90 tablet 3   No current facility-administered medications for this visit.   Facility-Administered Medications Ordered in Other Visits  Medication Dose Route Frequency Provider Last Rate Last Dose  .  heparin lock flush 100 unit/mL  500 Units Intravenous Once Cammie Sickle, MD          .  PHYSICAL EXAMINATION: ECOG PERFORMANCE STATUS: 0 - Asymptomatic  Filed Vitals:   10/09/15 0907  BP: 129/30  Pulse: 125  Temp: 96.8 F (36 C)  Resp: 18   Filed Weights   10/09/15 0907  Weight: 144 lb 2.9 oz (65.4 kg)    GENERAL: Well-nourished well-developed; Alert, no distress and comfortable.Accompanied by her mother. 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 mediport- no signs of infection.   LABORATORY DATA:  I have reviewed the data as listed Lab Results  Component Value Date   WBC 2.9* 10/09/2015   HGB 11.5* 10/09/2015   HCT 33.0* 10/09/2015   MCV 101.9* 10/09/2015   PLT 262 10/09/2015    Recent Labs  08/21/15 0931  09/04/15 0920 09/18/15 0856 09/25/15 0836  NA 136  < > 134* 139 137  K 3.6  < > 3.7 3.6 2.9*  CL 102  < > 102 103 103  CO2 27  < > _0 GLUCOSE 107*  < > 140* 128* 86  BUN 7  < > _1 CREATININE 0.47  < > 0.54 0.69 0.50  CALCIUM 8.4*  < > 8.8* 8.7* 8.4*  GFRNONAA >60  < > >60 >60 >60  GFRAA >60  < > >60 >60 >60  PROT 6.8  --  7.3 6.5  --   ALBUMIN 3.4*  --  3.9 3.7  --   AST 41  --  28 20  --   ALT 35  --  28 20  --   ALKPHOS 103  --  99 64  --   BILITOT 0.5  --  0.5 0.4  --   < > = values in this interval not displayed.   ASSESSMENT & PLAN:    # STAGE IV [place] focus of brain met]  Adenocarcinoma T2N2; right upper lobe; currently on carbotaxol along with radiation- status post 7 treatments. Patient tolerating chemoradiation with mild-to-moderate difficulties.Patient should be finishing up radiation on May 8.  # CBC- Mildly low white count 2.8 ANC 1.5. Hemoglobin is slightly lower and 11. CMP pending Proceed with cycle #8 today.  # THRUSH- Difficulty swallowing/ esophagitis/-improved.  #  3 mmsolitary/ brain metastases- Radiation/as per radiation oncology.  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   # Proceed with consolidation carboplatin and Taxol cycle #1 on May 29; check CBC CMP/ follow-up with me.      Cammie Sickle, MD 10/09/2015 9:18 AM

## 2015-10-12 ENCOUNTER — Ambulatory Visit
Admission: RE | Admit: 2015-10-12 | Discharge: 2015-10-12 | Disposition: A | Discharge status: Home / Self Care | Payer: BLUE CROSS/BLUE SHIELD | Source: Ambulatory Visit | Attending: Radiation Oncology | Admitting: Radiation Oncology

## 2015-10-12 DIAGNOSIS — C3411 Malignant neoplasm of upper lobe, right bronchus or lung: Secondary | ICD-10-CM | POA: Diagnosis not present

## 2015-11-04 ENCOUNTER — Inpatient Hospital Stay: Payer: BLUE CROSS/BLUE SHIELD

## 2015-11-04 ENCOUNTER — Inpatient Hospital Stay (HOSPITAL_BASED_OUTPATIENT_CLINIC_OR_DEPARTMENT_OTHER): Payer: BLUE CROSS/BLUE SHIELD | Admitting: Internal Medicine

## 2015-11-04 VITALS — BP 117/78 | HR 101 | Temp 97.8°F | Resp 20 | Ht 64.1 in | Wt 144.4 lb

## 2015-11-04 DIAGNOSIS — R200 Anesthesia of skin: Secondary | ICD-10-CM

## 2015-11-04 DIAGNOSIS — C3411 Malignant neoplasm of upper lobe, right bronchus or lung: Secondary | ICD-10-CM

## 2015-11-04 DIAGNOSIS — C7931 Secondary malignant neoplasm of brain: Secondary | ICD-10-CM

## 2015-11-04 DIAGNOSIS — T451X5A Adverse effect of antineoplastic and immunosuppressive drugs, initial encounter: Principal | ICD-10-CM

## 2015-11-04 DIAGNOSIS — R1310 Dysphagia, unspecified: Secondary | ICD-10-CM

## 2015-11-04 DIAGNOSIS — F17210 Nicotine dependence, cigarettes, uncomplicated: Secondary | ICD-10-CM

## 2015-11-04 DIAGNOSIS — E876 Hypokalemia: Secondary | ICD-10-CM

## 2015-11-04 DIAGNOSIS — L598 Other specified disorders of the skin and subcutaneous tissue related to radiation: Secondary | ICD-10-CM

## 2015-11-04 DIAGNOSIS — R531 Weakness: Secondary | ICD-10-CM

## 2015-11-04 DIAGNOSIS — K861 Other chronic pancreatitis: Secondary | ICD-10-CM

## 2015-11-04 DIAGNOSIS — C771 Secondary and unspecified malignant neoplasm of intrathoracic lymph nodes: Secondary | ICD-10-CM | POA: Diagnosis not present

## 2015-11-04 DIAGNOSIS — R5383 Other fatigue: Secondary | ICD-10-CM

## 2015-11-04 DIAGNOSIS — Z923 Personal history of irradiation: Secondary | ICD-10-CM

## 2015-11-04 DIAGNOSIS — B370 Candidal stomatitis: Secondary | ICD-10-CM

## 2015-11-04 DIAGNOSIS — R112 Nausea with vomiting, unspecified: Secondary | ICD-10-CM

## 2015-11-04 LAB — COMPREHENSIVE METABOLIC PANEL
ALBUMIN: 3.7 g/dL (ref 3.5–5.0)
ALT: 36 U/L (ref 14–54)
ANION GAP: 11 (ref 5–15)
AST: 54 U/L — AB (ref 15–41)
Alkaline Phosphatase: 91 U/L (ref 38–126)
BUN: <5 mg/dL — ABNORMAL LOW (ref 6–20)
CALCIUM: 8.4 mg/dL — AB (ref 8.9–10.3)
CO2: 25 mmol/L (ref 22–32)
CREATININE: 0.53 mg/dL (ref 0.44–1.00)
Chloride: 102 mmol/L (ref 101–111)
GFR calc Af Amer: >60 mL/min (ref >60)
GLUCOSE: 101 mg/dL — AB (ref 65–99)
Potassium: 3 mmol/L — ABNORMAL LOW (ref 3.5–5.1)
Sodium: 138 mmol/L (ref 135–145)
TOTAL PROTEIN: 6.6 g/dL (ref 6.5–8.1)
Total Bilirubin: 0.7 mg/dL (ref 0.3–1.2)

## 2015-11-04 LAB — CBC WITH DIFFERENTIAL/PLATELET
BASOS ABS: 0 10*3/uL (ref 0–0.1)
BASOS PCT: 1 %
EOS PCT: 2 %
Eosinophils Absolute: 0.1 10*3/uL (ref 0–0.7)
HCT: 35.5 % (ref 35.0–47.0)
Hemoglobin: 12.5 g/dL (ref 12.0–16.0)
Lymphocytes Relative: 33 %
Lymphs Abs: 1.2 10*3/uL (ref 1.0–3.6)
MCH: 39 pg — ABNORMAL HIGH (ref 26.0–34.0)
MCHC: 35.1 g/dL (ref 32.0–36.0)
MCV: 111.1 fL — AB (ref 80.0–100.0)
MONO ABS: 0.5 10*3/uL (ref 0.2–0.9)
Monocytes Relative: 14 %
Neutro Abs: 1.8 10*3/uL (ref 1.4–6.5)
Neutrophils Relative %: 50 %
PLATELETS: 250 10*3/uL (ref 150–440)
RBC: 3.2 MIL/uL — ABNORMAL LOW (ref 3.80–5.20)
RDW: 18.3 % — AB (ref 11.5–14.5)
WBC: 3.6 10*3/uL (ref 3.6–11.0)

## 2015-11-04 MED ORDER — SODIUM CHLORIDE 0.9 % IV SOLN
20.0000 mg | Freq: Once | INTRAVENOUS | Status: AC
  Administered 2015-11-04: 20 mg via INTRAVENOUS
  Filled 2015-11-04: qty 2

## 2015-11-04 MED ORDER — ONDANSETRON HCL 8 MG PO TABS: 8.0000 mg | ORAL_TABLET | Freq: Three times a day (TID) | ORAL | Status: DC | PRN

## 2015-11-04 MED ORDER — DIPHENHYDRAMINE HCL 50 MG/ML IJ SOLN
50.0000 mg | Freq: Once | INTRAMUSCULAR | Status: AC
  Administered 2015-11-04: 50 mg via INTRAVENOUS
  Filled 2015-11-04: qty 1

## 2015-11-04 MED ORDER — SODIUM CHLORIDE 0.9 % IV SOLN
712.8000 mg | Freq: Once | INTRAVENOUS | Status: AC
  Administered 2015-11-04: 710 mg via INTRAVENOUS
  Filled 2015-11-04: qty 71

## 2015-11-04 MED ORDER — PACLITAXEL CHEMO INJECTION 300 MG/50ML
200.0000 mg/m2 | Freq: Once | INTRAVENOUS | Status: AC
  Administered 2015-11-04: 342 mg via INTRAVENOUS
  Filled 2015-11-04: qty 57

## 2015-11-04 MED ORDER — SODIUM CHLORIDE 0.9% FLUSH
10.0000 mL | INTRAVENOUS | Status: DC | PRN
  Administered 2015-11-04: 10 mL via INTRAVENOUS
  Filled 2015-11-04: qty 10

## 2015-11-04 MED ORDER — POTASSIUM CHLORIDE CRYS ER 20 MEQ PO TBCR: 40.0000 meq | EXTENDED_RELEASE_TABLET | Freq: Every day | ORAL | Status: DC

## 2015-11-04 MED ORDER — SODIUM CHLORIDE 0.9 % IV SOLN
Freq: Once | INTRAVENOUS | Status: AC
  Administered 2015-11-04: 11:00:00 via INTRAVENOUS
  Filled 2015-11-04: qty 1000

## 2015-11-04 MED ORDER — HEPARIN SOD (PORK) LOCK FLUSH 100 UNIT/ML IV SOLN
500.0000 [IU] | Freq: Once | INTRAVENOUS | Status: AC
  Administered 2015-11-04: 500 [IU] via INTRAVENOUS
  Filled 2015-11-04: qty 5

## 2015-11-04 MED ORDER — PALONOSETRON HCL INJECTION 0.25 MG/5ML
0.2500 mg | Freq: Once | INTRAVENOUS | Status: AC
  Administered 2015-11-04: 0.25 mg via INTRAVENOUS
  Filled 2015-11-04: qty 5

## 2015-11-04 MED ORDER — FAMOTIDINE IN NACL 20-0.9 MG/50ML-% IV SOLN
20.0000 mg | Freq: Once | INTRAVENOUS | Status: AC
  Administered 2015-11-04: 20 mg via INTRAVENOUS
  Filled 2015-11-04: qty 50

## 2015-11-04 NOTE — Progress Notes (Signed)
Dysphagia symptoms have improved.    Pt requesting RF on Zofran.

## 2015-11-04 NOTE — Patient Instructions (Addendum)
Take oral potassium--potassium chloride SA (K-DUR,KLOR-CON) 20 MEQ tablet. Take 2 tablets daily until further instructed by md.  We will recheck your potassium level at your next visit.

## 2015-11-04 NOTE — Progress Notes (Signed)
[place] CONSULT NOTE  Patient Care Team: Pcp Not In System as PCP - General  CHIEF COMPLAINTS/PURPOSE OF CONSULTATION:   # FEB 2017- ADENOCARCINOMA RUL [T2N2; STAGE III  Bronch/FNA- Right paratracheal LN;Dr.Ram]. PET- no distant mets; March 3rd START CARBO-TAXOL Weekly +RT [until may 5th?]  # FEB 2017- MRI Brain 57m focus of met [Right frontal]  # "Chronic pancreatitis"/ smoker [15ppt]  HISTORY OF PRESENTING ILLNESS:  Tiffany Erickson 45y.o.  female  diagnosed stage III adenocarcinoma of the right lung-currently status post concurrent chemoradiation therapy approximately 4 weeks ago; is here to start consolidation chemotherapy with carbotaxol.  Patient's appetite is improving. She is gaining weight. Difficulty swallowing is significantly improved. Thrush resolved. Energy levels are improving.   She continues to have abdominal discomfort chronic nausea- related to chronic pancreatitis. This is not getting any worse.  Fatigue and shortness of breath improving.  No cough or hemoptysis. She denies any headaches. No swelling in the legs or arms. No hoarseness of voice. No vision changes or double vision.   ROS: A complete 10 point review of system is done which is negative except mentioned above in history of present illness  MEDICAL HISTORY:  Past Medical History  Diagnosis Date  . Chronic pancreatitis ([facility]   . Multiple fractures of ribs of right side 06/01/2015    "5" S/P fall  LEFT SIDE  . Concussion 06/01/2015    S/P fall-PT STATES IN PHONE INTERVIEW THAT SHE IS UNSURE IF SHE HAD A CONCUSSION OR NOT  . Lung mass     right side noted 06/01/2015  . Anxiety   . GERD (gastroesophageal reflux disease)   . Frequent UTI     "sometimes" (06/01/2015)  . Shortness of breath dyspnea     5 rib fx right side  . Lung cancer ([place] 07/[place]    METASTATIC ADENOCARCINOMA OF THE LUNG  . Broken ribs 05/2015    5 ribs    SURGICAL HISTORY: Past Surgical History  Procedure  Laterality Date  . Tonsillectomy    . Myringotomy with tube placement Bilateral   . Eye muscle surgery Left ~ 1979  . Esophagogastroduodenoscopy    . Endobronchial ultrasound N/A 07/13/2015    Procedure: ENDOBRONCHIAL ULTRASOUND;  Surgeon: PLaverle Hobby MD;  Location: [facility] ORS;  Service: Pulmonary;  Laterality: N/A;  . Portacath placement Left 07/27/2015    Procedure: INSERTION PORT-A-CATH;  Surgeon: TNestor Lewandowsky MD;  Location: [facility] ORS;  Service: General;  Laterality: Left;    SOCIAL HISTORY: Social History   Social History  . Marital Status: Single    Spouse Name: N/A  . Number of Children: N/A  . Years of Education: N/A   Occupational History  . Not on file.   Social History Main Topics  . Smoking status: Current Every Day Smoker -- 0.50 packs/day for 28 years    Types: Cigarettes  . Smokeless tobacco: Never Used  . Alcohol Use: 8.4 oz/week    2 Glasses of wine, 12 Cans of beer per week     Comment: 06/01/2015 "drink probably 4 times/wk; either ;4 beers or 2 glasses of wine when I do drink"  . Drug Use: No  . Sexual Activity: Not Currently   Other Topics Concern  . Not on file   Social History Narrative    FAMILY HISTORY: Family History  Problem Relation Age of Onset  . Colon cancer Maternal Grandfather   . CAD Maternal Grandfather   . Pancreatic cancer Maternal  Grandmother     ALLERGIES:  is allergic to sulfa antibiotics.  MEDICATIONS:  Current Outpatient Prescriptions  Medication Sig Dispense Refill  . ALPRAZolam (XANAX) 1 MG tablet Take 0.5-1 mg by mouth 3 (three) times daily as needed for anxiety.     . lidocaine-prilocaine (EMLA) cream Apply 1 application topically as needed. 30 g 6  . metoCLOPramide (REGLAN) 10 MG tablet Take 1 tablet (10 mg total) by mouth every 8 (eight) hours as needed for nausea or vomiting. 10 tablet 0  . ondansetron (ZOFRAN) 8 MG tablet Take 1 tablet (8 mg total) by mouth every 8 (eight) hours as needed for nausea or  vomiting (start 3 days; after chemo). 40 tablet 0  . pantoprazole (PROTONIX) 20 MG tablet Take 20 mg by mouth at bedtime.     Marland Kitchen HYDROcodone-acetaminophen (NORCO/VICODIN) 5-325 MG tablet One tablet every 12 hours as needed for pain (Patient not taking: Reported on 11/04/2015) 30 tablet 0  . prochlorperazine (COMPAZINE) 10 MG tablet Take 1 tablet (10 mg total) by mouth every 6 (six) hours as needed for nausea or vomiting. (Patient not taking: Reported on 10/09/2015) 40 tablet 0  . promethazine (PHENERGAN) 25 MG tablet Take 25 mg by mouth every 6 (six) hours as needed for nausea or vomiting. Reported on 11/04/2015     No current facility-administered medications for this visit.   Facility-Administered Medications Ordered in Other Visits  Medication Dose Route Frequency Provider Last Rate Last Dose  . heparin lock flush 100 unit/mL  500 Units Intravenous Once Cammie Sickle, MD      . sodium chloride flush (NS) 0.9 % injection 10 mL  10 mL Intravenous PRN Cammie Sickle, MD   10 mL at 11/04/15 0830      .  PHYSICAL EXAMINATION: ECOG PERFORMANCE STATUS: 0 - Asymptomatic  Filed Vitals:   11/04/15 0857  BP: 117/78  Pulse: 101  Temp: 97.8 F (36.6 C)  Resp: 20   Filed Weights   11/04/15 0857  Weight: 144 lb 6.4 oz (65.5 kg)    GENERAL: Well-nourished well-developed; Alert, no distress and comfortable.She is alone. EYES: no pallor or icterus OROPHARYNX: no thrush or ulceration; good dentition  NECK: supple, no masses felt LYMPH:  no palpable lymphadenopathy in the cervical, axillary or inguinal regions LUNGS: clear to auscultation and  No wheeze or crackles HEART/CVS: regular rate & rhythm and no murmurs; No lower extremity edema ABDOMEN: abdomen soft, non-tender and normal bowel sounds Musculoskeletal:no cyanosis of digits and no clubbing  PSYCH: alert & oriented x 3 with fluent speech; anxious NEURO: no focal motor/sensory deficits SKIN: Radiation dermatitis noted in the  radiation portal. mediport- no signs of infection.   LABORATORY DATA:  I have reviewed the data as listed Lab Results  Component Value Date   WBC 3.6 11/04/2015   HGB 12.5 11/04/2015   HCT 35.5 11/04/2015   MCV 111.1* 11/04/2015   PLT 250 11/04/2015    Recent Labs  09/04/15 0920 09/18/15 0856 09/25/15 0836 10/09/15 0845  NA 134* 139 137 140  K 3.7 3.6 2.9* 3.2*  CL 102 103 103 105  CO2 24 30 27 25   GLUCOSE 140* 128* 86 100*  BUN 8 14 12  <5*  CREATININE 0.54 0.69 0.50 0.62  CALCIUM 8.8* 8.7* 8.4* 8.6*  GFRNONAA >60 >60 >60 >60  GFRAA >60 >60 >60 >60  PROT 7.3 6.5  --  6.5  ALBUMIN 3.9 3.7  --  3.7  AST 28 20  --  32  ALT 28 20  --  25  ALKPHOS 99 64  --  79  BILITOT 0.5 0.4  --  0.5     ASSESSMENT & PLAN:    # STAGE IV [tiny focus of brain met] Adenocarcinoma T2N2; right upper lobe; currently  S/p carbotaxol along with radiation- status post 7 treatments; Finished May 8.  # Proceed with consolidation chemotherapy carbotaxol cycle #1 today. CBC CMP unremarkable. Hold off Neulasta at this time.  # Chronic nausea/abdominal discomfort secondary to chronic pancreatitis. Recommend GI evaluation post treatment for lung cancer.  # THRUSH- Difficulty swallowing/ esophagitis/-improved.  #  3 mmsolitary/ brain metastases- status post radiation.  # Follow-up with me in 10 days with a CBC BMP; follow-up with me in 3 weeks CBC CMP/cycle #2 of consolidation chemotherapy.     Cammie Sickle, MD 11/04/2015 9:01 AM

## 2015-11-16 ENCOUNTER — Inpatient Hospital Stay (HOSPITAL_BASED_OUTPATIENT_CLINIC_OR_DEPARTMENT_OTHER): Payer: BLUE CROSS/BLUE SHIELD | Admitting: Internal Medicine

## 2015-11-16 ENCOUNTER — Inpatient Hospital Stay: Payer: BLUE CROSS/BLUE SHIELD | Attending: Internal Medicine

## 2015-11-16 ENCOUNTER — Other Ambulatory Visit: Payer: Self-pay

## 2015-11-16 ENCOUNTER — Other Ambulatory Visit: Payer: Self-pay | Admitting: *Deleted

## 2015-11-16 ENCOUNTER — Inpatient Hospital Stay: Payer: BLUE CROSS/BLUE SHIELD

## 2015-11-16 VITALS — BP 108/75 | HR 121 | Temp 97.9°F | Resp 18 | Wt 141.1 lb

## 2015-11-16 DIAGNOSIS — Z9221 Personal history of antineoplastic chemotherapy: Secondary | ICD-10-CM | POA: Insufficient documentation

## 2015-11-16 DIAGNOSIS — E860 Dehydration: Secondary | ICD-10-CM

## 2015-11-16 DIAGNOSIS — Z8781 Personal history of (healed) traumatic fracture: Secondary | ICD-10-CM

## 2015-11-16 DIAGNOSIS — K861 Other chronic pancreatitis: Secondary | ICD-10-CM

## 2015-11-16 DIAGNOSIS — G8929 Other chronic pain: Secondary | ICD-10-CM | POA: Diagnosis not present

## 2015-11-16 DIAGNOSIS — R110 Nausea: Secondary | ICD-10-CM

## 2015-11-16 DIAGNOSIS — N63 Unspecified lump in breast: Secondary | ICD-10-CM | POA: Insufficient documentation

## 2015-11-16 DIAGNOSIS — C771 Secondary and unspecified malignant neoplasm of intrathoracic lymph nodes: Secondary | ICD-10-CM

## 2015-11-16 DIAGNOSIS — K219 Gastro-esophageal reflux disease without esophagitis: Secondary | ICD-10-CM

## 2015-11-16 DIAGNOSIS — R5383 Other fatigue: Secondary | ICD-10-CM

## 2015-11-16 DIAGNOSIS — R109 Unspecified abdominal pain: Secondary | ICD-10-CM | POA: Insufficient documentation

## 2015-11-16 DIAGNOSIS — C3411 Malignant neoplasm of upper lobe, right bronchus or lung: Secondary | ICD-10-CM

## 2015-11-16 DIAGNOSIS — Z5111 Encounter for antineoplastic chemotherapy: Secondary | ICD-10-CM | POA: Diagnosis not present

## 2015-11-16 DIAGNOSIS — R5382 Chronic fatigue, unspecified: Secondary | ICD-10-CM | POA: Diagnosis not present

## 2015-11-16 DIAGNOSIS — L538 Other specified erythematous conditions: Secondary | ICD-10-CM | POA: Diagnosis not present

## 2015-11-16 DIAGNOSIS — R1084 Generalized abdominal pain: Secondary | ICD-10-CM

## 2015-11-16 DIAGNOSIS — Z79899 Other long term (current) drug therapy: Secondary | ICD-10-CM | POA: Diagnosis not present

## 2015-11-16 DIAGNOSIS — R630 Anorexia: Secondary | ICD-10-CM | POA: Insufficient documentation

## 2015-11-16 DIAGNOSIS — Z800 Family history of malignant neoplasm of digestive organs: Secondary | ICD-10-CM | POA: Diagnosis not present

## 2015-11-16 DIAGNOSIS — Z923 Personal history of irradiation: Secondary | ICD-10-CM | POA: Insufficient documentation

## 2015-11-16 DIAGNOSIS — C7931 Secondary malignant neoplasm of brain: Secondary | ICD-10-CM | POA: Diagnosis not present

## 2015-11-16 DIAGNOSIS — L598 Other specified disorders of the skin and subcutaneous tissue related to radiation: Secondary | ICD-10-CM | POA: Insufficient documentation

## 2015-11-16 DIAGNOSIS — T451X5A Adverse effect of antineoplastic and immunosuppressive drugs, initial encounter: Secondary | ICD-10-CM

## 2015-11-16 DIAGNOSIS — E876 Hypokalemia: Secondary | ICD-10-CM | POA: Diagnosis not present

## 2015-11-16 DIAGNOSIS — R0602 Shortness of breath: Secondary | ICD-10-CM | POA: Diagnosis not present

## 2015-11-16 DIAGNOSIS — F17210 Nicotine dependence, cigarettes, uncomplicated: Secondary | ICD-10-CM | POA: Diagnosis not present

## 2015-11-16 DIAGNOSIS — Z95828 Presence of other vascular implants and grafts: Secondary | ICD-10-CM

## 2015-11-16 DIAGNOSIS — R112 Nausea with vomiting, unspecified: Secondary | ICD-10-CM

## 2015-11-16 LAB — BASIC METABOLIC PANEL
ANION GAP: 9 (ref 5–15)
CHLORIDE: 95 mmol/L — AB (ref 101–111)
CO2: 31 mmol/L (ref 22–32)
Calcium: 8.8 mg/dL — ABNORMAL LOW (ref 8.9–10.3)
Creatinine, Ser: 0.68 mg/dL (ref 0.44–1.00)
GFR calc Af Amer: >60 mL/min (ref >60)
GLUCOSE: 117 mg/dL — AB (ref 65–99)
POTASSIUM: 2.8 mmol/L — AB (ref 3.5–5.1)
SODIUM: 135 mmol/L (ref 135–145)

## 2015-11-16 LAB — CBC WITH DIFFERENTIAL/PLATELET
Basophils Absolute: 0 10*3/uL (ref 0–0.1)
Basophils Relative: 0 %
EOS PCT: 4 %
Eosinophils Absolute: 0.1 10*3/uL (ref 0–0.7)
HCT: 29.6 % — ABNORMAL LOW (ref 35.0–47.0)
HEMOGLOBIN: 10.4 g/dL — AB (ref 12.0–16.0)
LYMPHS ABS: 0.9 10*3/uL — AB (ref 1.0–3.6)
LYMPHS PCT: 54 %
MCH: 40 pg — AB (ref 26.0–34.0)
MCHC: 35 g/dL (ref 32.0–36.0)
MCV: 114.3 fL — AB (ref 80.0–100.0)
Monocytes Absolute: 0.6 10*3/uL (ref 0.2–0.9)
Monocytes Relative: 34 %
NEUTROS PCT: 8 %
Neutro Abs: 0.1 10*3/uL — ABNORMAL LOW (ref 1.7–7.7)
PLATELETS: 112 10*3/uL — AB (ref 150–440)
RBC: 2.59 MIL/uL — AB (ref 3.80–5.20)
RDW: 13.1 % (ref 11.5–14.5)
WBC: 1.6 10*3/uL — AB (ref 3.6–11.0)

## 2015-11-16 LAB — LIPASE, BLOOD: Lipase: 17 U/L (ref 11–51)

## 2015-11-16 LAB — AMYLASE: AMYLASE: 39 U/L (ref 28–100)

## 2015-11-16 MED ORDER — OXYCODONE-ACETAMINOPHEN 5-325 MG PO TABS: 1.0000 | ORAL_TABLET | Freq: Three times a day (TID) | ORAL | Status: DC | PRN

## 2015-11-16 MED ORDER — HEPARIN SOD (PORK) LOCK FLUSH 100 UNIT/ML IV SOLN
500.0000 [IU] | Freq: Once | INTRAVENOUS | Status: AC
  Administered 2015-11-16: 500 [IU] via INTRAVENOUS
  Filled 2015-11-16: qty 5

## 2015-11-16 MED ORDER — POTASSIUM CHLORIDE CRYS ER 20 MEQ PO TBCR: 40.0000 meq | EXTENDED_RELEASE_TABLET | Freq: Every day | ORAL | Status: DC

## 2015-11-16 MED ORDER — SODIUM CHLORIDE 0.9% FLUSH
10.0000 mL | INTRAVENOUS | Status: DC | PRN
  Administered 2015-11-16: 10 mL via INTRAVENOUS
  Filled 2015-11-16: qty 10

## 2015-11-16 MED ORDER — PROCHLORPERAZINE MALEATE 10 MG PO TABS: 10.0000 mg | ORAL_TABLET | Freq: Four times a day (QID) | ORAL | Status: DC | PRN

## 2015-11-16 MED ORDER — SODIUM CHLORIDE 0.9 % IV SOLN
Freq: Once | INTRAVENOUS | Status: AC
  Administered 2015-11-16: 11:00:00 via INTRAVENOUS
  Filled 2015-11-16: qty 1000

## 2015-11-16 MED ORDER — SODIUM CHLORIDE 0.9 % IV SOLN
INTRAVENOUS | Status: DC
  Filled 2015-11-16: qty 1000

## 2015-11-16 NOTE — Progress Notes (Signed)
[place] CONSULT NOTE  Patient Care Team: Pcp Not In System as PCP - General  CHIEF COMPLAINTS/PURPOSE OF CONSULTATION:   # FEB 2017- ADENOCARCINOMA RUL [T2N2; STAGE III  Bronch/FNA- Right paratracheal LN;Dr.Ram]. PET- no distant mets; March 3rd START CARBO-TAXOL Weekly +RT [until may 5th?]  # FEB 2017- MRI Brain 58m focus of met [Right frontal]  # "Chronic pancreatitis"/ smoker [15ppt]  HISTORY OF PRESENTING ILLNESS:  Tiffany Erickson 45y.o.  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  Patient complains of poor appetite. Continues to chronic abdominal discomfort; slightly worsened. Chronic nausea. Positive for fatigue. She feels dehydrated.   No cough or hemoptysis. She denies any headaches. No swelling in the legs or arms. No hoarseness of voice. No vision changes or double vision.   ROS: A complete 10 point review of system is done which is negative except mentioned above in history of present illness  MEDICAL HISTORY:  Past Medical History  Diagnosis Date  . [place] ([place]   . Multiple fractures of ribs of right side 06/01/2015    "5" S/P fall  LEFT SIDE  . Concussion 06/01/2015    S/P fall-PT STATES IN PHONE INTERVIEW THAT SHE IS UNSURE IF SHE HAD A CONCUSSION OR NOT  . Lung mass     right side noted 06/01/2015  . Anxiety   . GERD (gastroesophageal reflux disease)   . Frequent UTI     "sometimes" (06/01/2015)  . Shortness of breath dyspnea     5 rib fx right side  . Lung cancer ([facility] 07/13/15    METASTATIC ADENOCARCINOMA OF THE LUNG  . Broken ribs 05/2015    5 ribs    SURGICAL HISTORY: Past Surgical History  Procedure Laterality Date  . Tonsillectomy    . Myringotomy with tube placement Bilateral   . Eye muscle surgery Left ~ 1979  . Esophagogastroduodenoscopy     . Endobronchial ultrasound N/A 07/13/2015    Procedure: ENDOBRONCHIAL ULTRASOUND;  Surgeon: PLaverle Hobby MD;  Location: [facility] ORS;  Service: Pulmonary;  Laterality: N/A;  . Portacath placement Left 07/27/2015    Procedure: INSERTION PORT-A-CATH;  Surgeon: TNestor Lewandowsky MD;  Location: [facility] ORS;  Service: General;  Laterality: Left;    SOCIAL HISTORY: Social History   Social History  . Marital Status: Single    Spouse Name: N/A  . Number of Children: N/A  . Years of Education: N/A   Occupational History  . Not on file.   Social History Main Topics  . Smoking status: Current Every Day Smoker -- 0.50 packs/day for 28 years    Types: Cigarettes  . Smokeless tobacco: Never Used  . Alcohol Use: 8.4 oz/week    2 Glasses of wine, 12 Cans of beer per week     Comment: 06/01/2015 "drink probably 4 times/wk; either ;4 beers or 2 glasses of wine when I do drink"  . Drug Use: No  . Sexual Activity: Not Currently   Other Topics Concern  . Not on file   Social History Narrative    FAMILY HISTORY: Family History  Problem Relation Age of Onset  . Colon cancer Maternal Grandfather   . CAD Maternal Grandfather   . Pancreatic cancer Maternal Grandmother     ALLERGIES:  is allergic to sulfa antibiotics.  MEDICATIONS:  Current Outpatient Prescriptions  Medication Sig Dispense Refill  .  ALPRAZolam (XANAX) 1 MG tablet Take 0.5-1 mg by mouth 3 (three) times daily as needed for anxiety.     Marland Kitchen HYDROcodone-acetaminophen (NORCO/VICODIN) 5-325 MG tablet One tablet every 12 hours as needed for pain 30 tablet 0  . lidocaine-prilocaine (EMLA) cream Apply 1 application topically as needed. 30 g 6  . metoCLOPramide (REGLAN) 10 MG tablet Take 1 tablet (10 mg total) by mouth every 8 (eight) hours as needed for nausea or vomiting. 10 tablet 0  . ondansetron (ZOFRAN) 8 MG tablet Take 1 tablet (8 mg total) by mouth every 8 (eight) hours as needed for nausea or vomiting (start 3 days; after chemo).  40 tablet 3  . pantoprazole (PROTONIX) 20 MG tablet Take 20 mg by mouth at bedtime.     . potassium chloride SA (K-DUR,KLOR-CON) 20 MEQ tablet Take 2 tablets (40 mEq total) by mouth daily. 30 tablet 1  . prochlorperazine (COMPAZINE) 10 MG tablet Take 1 tablet (10 mg total) by mouth every 6 (six) hours as needed for nausea or vomiting. 40 tablet 0  . promethazine (PHENERGAN) 25 MG tablet Take 25 mg by mouth every 6 (six) hours as needed for nausea or vomiting. Reported on 11/04/2015     No current facility-administered medications for this visit.      Marland Kitchen  PHYSICAL EXAMINATION: ECOG PERFORMANCE STATUS: 0 - Asymptomatic  Filed Vitals:   11/16/15 1021  BP: 108/75  Pulse: 121  Temp: 97.9 F (36.6 C)  Resp: 18   Filed Weights   11/16/15 1021  Weight: 141 lb 1.5 oz (64 kg)    GENERAL: Well-nourished well-developed; Alert, no distress and comfortable.She is alone. 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 mediport- no signs of infection.   LABORATORY DATA:  I have reviewed the data as listed Lab Results  Component Value Date   WBC 1.6* 11/16/2015   HGB 10.4* 11/16/2015   HCT 29.6* 11/16/2015   MCV 114.3* 11/16/2015   PLT 112* 11/16/2015    Recent Labs  09/18/15 0856  10/09/15 0845 11/04/15 0830 11/16/15 0950  NA 139  < > 140 138 135  K 3.6  < > 3.2* 3.0* 2.8*  CL 103  < > 105 102 95*  CO2 30  < > 25 25 31   GLUCOSE 128*  < > 100*  101* 117*  BUN 14  < > <5* <5* <5*  CREATININE 0.69  < > 0.62 0.53 0.68  CALCIUM 8.7*  < > 8.6* 8.4* 8.8*  GFRNONAA >60  < > >60 >60 >60  GFRAA >60  < > >60 >60 >60  PROT 6.5  --  6.5 6.6  --   ALBUMIN 3.7  --  3.7 3.7  --   AST 20  --  32 54*  --   ALT 20  --  25 36  --   ALKPHOS 64  --  79 91  --   BILITOT 0.4  --  0.5 0.7  --   < > = values  in this interval not displayed.   ASSESSMENT & PLAN:    # STAGE IV [place] focus of brain met] Adenocarcinoma T2N2; right upper lobe; Status post concurrent chemoradiation followed by consolidation Taxol No. 1 approximately 10 days ago.   # Status post consolidation chemotherapy-carbotaxol cycle #1 approximately 10 days ago. Patient having moderate difficulties-C discussion below  # Neutropenia white count 100 ANC 1.8/ no fevers. Plan Neulasta with the next treatment. Growth factor-Neulasta/On pro would be given as prophylaxis for chemotherapy-induced neutropenia to prevent febrile neutropenias. Discussed potential side effect- myalgias/arthralgias- recommend Claritin for 4 days.   # Poor by mouth intake Dehydration/tachycardia- IVFs 1 L  # Hypokalemia-potassium 2.8- 40kcl- IV today; also recommend 40 mg of potassium once a  # abdominal pain-slightly worse- ? secondary to chronic pancreatitis.  Start oxycodone. Check lipase amylase.  #  3 mmsolitary/ brain metastases- status post radiation.  # Follow-up with me in approximately 10 days for cycle #2/consolidation chemotherapy- CBC CMP.     Cammie Sickle, MD 11/16/2015 10:29 AM

## 2015-11-16 NOTE — Progress Notes (Signed)
Patient requesting refill for compazine.  After last chemo patient experienced severe stomach cramping that radiated down her legs to the point she could not walk..  About 5-6 days later she being to have vaginal bleeding (brownish with odor).  Still having some spotting today.  States she took her Vicodin for pain but it did not help her.  Asking if she can have something stronger for pain. States she had a bottle of pain medication left from when she broke her ribs (Oxycotin, she thinks), and used it and it helped the pain.  Also states she is having increased nausea.    Clinical Lab called (Kim) - ANC 0.1  K+ 2.8 MD notified.

## 2015-11-18 ENCOUNTER — Encounter: Payer: Self-pay | Admitting: Radiation Oncology

## 2015-11-18 ENCOUNTER — Ambulatory Visit
Admission: RE | Admit: 2015-11-18 | Discharge: 2015-11-18 | Disposition: A | Discharge status: Home / Self Care | Payer: BLUE CROSS/BLUE SHIELD | Source: Ambulatory Visit | Attending: Radiation Oncology | Admitting: Radiation Oncology

## 2015-11-18 ENCOUNTER — Other Ambulatory Visit: Payer: BLUE CROSS/BLUE SHIELD

## 2015-11-18 VITALS — BP 114/72 | HR 93 | Temp 96.6°F | Resp 20 | Wt 143.1 lb

## 2015-11-18 DIAGNOSIS — C3411 Malignant neoplasm of upper lobe, right bronchus or lung: Secondary | ICD-10-CM

## 2015-11-18 NOTE — Progress Notes (Signed)
Radiation Oncology Follow up Note  Name: Tiffany Erickson   Date:   11/18/2015 MRN:  [id number] DOB: [birth year]    This 45 y.o. female presents to the clinic today for follow-up for stage IV non-small cell lung cancer T2 N2 M1 with brain metastasis status post brain radiation plus chest radiation now seen out 1 month.  REFERRING PROVIDER: No ref. provider found  HPI: Patient is a 45 year old female now seen out 1 month having completed both chest and brain radiation for solitary brain metastasis from stage IV non-small cell lung cancer.. She continues to have problems with fatigue shortness of breath and abdominal discomfort most likely related to chronic pancreatitis. She's currently on consolidative chemotherapy with carbotaxol under medical oncology's direction. She specifically denies cough hemoptysis or chest tightness.  COMPLICATIONS OF TREATMENT: none  FOLLOW UP COMPLIANCE: keeps appointments   PHYSICAL EXAM:  BP 114/72 mmHg  Pulse 93  Temp(Src) 96.6 F (35.9 C)  Resp 20  Wt 143 lb 1.3 oz (64.9 kg) Well-developed well-nourished patient in NAD. HEENT reveals PERLA, EOMI, discs not visualized.  Oral cavity is clear. No oral mucosal lesions are identified. Neck is clear without evidence of cervical or supraclavicular adenopathy. Lungs are clear to A&P. Cardiac examination is essentially unremarkable with regular rate and rhythm without murmur rub or thrill. Abdomen is benign with no organomegaly or masses noted. Motor sensory and DTR levels are equal and symmetric in the upper and lower extremities. Cranial nerves II through XII are grossly intact. Proprioception is intact. No peripheral adenopathy or edema is identified. No motor or sensory levels are noted. Crude visual fields are within normal range.  RADIOLOGY RESULTS: No current films for review.  PLAN: Present time patient is stable can continuing with consolidative carboplatin chemotherapy. She is completely off steroids at this  point. No change in neurologic status. I've asked to see her back in 3-4 months for follow-up. She continues close follow-up care with medical oncology. I would be happy to reevaluate her at any time should further palliative treatment be indicated.  I would like to take this opportunity to thank you for allowing me to participate in the care of your patient.Armstead Peaks., MD

## 2015-11-24 ENCOUNTER — Encounter: Payer: Self-pay | Admitting: Internal Medicine

## 2015-11-24 ENCOUNTER — Other Ambulatory Visit: Payer: Self-pay | Admitting: Internal Medicine

## 2015-11-24 DIAGNOSIS — Z5111 Encounter for antineoplastic chemotherapy: Secondary | ICD-10-CM | POA: Insufficient documentation

## 2015-11-24 DIAGNOSIS — K861 Other chronic pancreatitis: Secondary | ICD-10-CM | POA: Insufficient documentation

## 2015-11-24 DIAGNOSIS — D702 Other drug-induced agranulocytosis: Secondary | ICD-10-CM | POA: Insufficient documentation

## 2015-11-25 ENCOUNTER — Other Ambulatory Visit: Payer: Self-pay | Admitting: *Deleted

## 2015-11-25 ENCOUNTER — Inpatient Hospital Stay (HOSPITAL_BASED_OUTPATIENT_CLINIC_OR_DEPARTMENT_OTHER): Payer: BLUE CROSS/BLUE SHIELD | Admitting: Internal Medicine

## 2015-11-25 ENCOUNTER — Inpatient Hospital Stay: Payer: BLUE CROSS/BLUE SHIELD

## 2015-11-25 ENCOUNTER — Other Ambulatory Visit: Payer: Self-pay | Admitting: Internal Medicine

## 2015-11-25 VITALS — BP 106/71 | HR 103 | Resp 18

## 2015-11-25 VITALS — BP 101/67 | HR 105 | Temp 97.3°F | Resp 18 | Wt 143.1 lb

## 2015-11-25 DIAGNOSIS — C771 Secondary and unspecified malignant neoplasm of intrathoracic lymph nodes: Secondary | ICD-10-CM

## 2015-11-25 DIAGNOSIS — C7931 Secondary malignant neoplasm of brain: Secondary | ICD-10-CM | POA: Diagnosis not present

## 2015-11-25 DIAGNOSIS — N63 Unspecified lump in breast: Secondary | ICD-10-CM

## 2015-11-25 DIAGNOSIS — C3411 Malignant neoplasm of upper lobe, right bronchus or lung: Secondary | ICD-10-CM

## 2015-11-25 DIAGNOSIS — R0602 Shortness of breath: Secondary | ICD-10-CM

## 2015-11-25 DIAGNOSIS — F17210 Nicotine dependence, cigarettes, uncomplicated: Secondary | ICD-10-CM

## 2015-11-25 DIAGNOSIS — R110 Nausea: Secondary | ICD-10-CM

## 2015-11-25 DIAGNOSIS — R630 Anorexia: Secondary | ICD-10-CM

## 2015-11-25 DIAGNOSIS — K861 Other chronic pancreatitis: Secondary | ICD-10-CM

## 2015-11-25 DIAGNOSIS — G8929 Other chronic pain: Secondary | ICD-10-CM

## 2015-11-25 DIAGNOSIS — E876 Hypokalemia: Secondary | ICD-10-CM

## 2015-11-25 DIAGNOSIS — L598 Other specified disorders of the skin and subcutaneous tissue related to radiation: Secondary | ICD-10-CM

## 2015-11-25 DIAGNOSIS — R109 Unspecified abdominal pain: Secondary | ICD-10-CM

## 2015-11-25 DIAGNOSIS — T451X5A Adverse effect of antineoplastic and immunosuppressive drugs, initial encounter: Principal | ICD-10-CM

## 2015-11-25 DIAGNOSIS — Z79899 Other long term (current) drug therapy: Secondary | ICD-10-CM

## 2015-11-25 DIAGNOSIS — R112 Nausea with vomiting, unspecified: Secondary | ICD-10-CM

## 2015-11-25 DIAGNOSIS — L538 Other specified erythematous conditions: Secondary | ICD-10-CM

## 2015-11-25 DIAGNOSIS — N611 Abscess of the breast and nipple: Secondary | ICD-10-CM

## 2015-11-25 DIAGNOSIS — R5382 Chronic fatigue, unspecified: Secondary | ICD-10-CM

## 2015-11-25 LAB — COMPREHENSIVE METABOLIC PANEL
ALK PHOS: 77 U/L (ref 38–126)
ALT: 15 U/L (ref 14–54)
ANION GAP: 7 (ref 5–15)
AST: 25 U/L (ref 15–41)
Albumin: 3.5 g/dL (ref 3.5–5.0)
BUN: <5 mg/dL — ABNORMAL LOW (ref 6–20)
CALCIUM: 8.7 mg/dL — AB (ref 8.9–10.3)
CO2: 28 mmol/L (ref 22–32)
CREATININE: 0.58 mg/dL (ref 0.44–1.00)
Chloride: 103 mmol/L (ref 101–111)
Glucose, Bld: 108 mg/dL — ABNORMAL HIGH (ref 65–99)
Potassium: 3.1 mmol/L — ABNORMAL LOW (ref 3.5–5.1)
Sodium: 138 mmol/L (ref 135–145)
Total Bilirubin: 0.5 mg/dL (ref 0.3–1.2)
Total Protein: 6.6 g/dL (ref 6.5–8.1)

## 2015-11-25 LAB — CBC WITH DIFFERENTIAL/PLATELET
Basophils Absolute: 0 10*3/uL (ref 0–0.1)
Basophils Relative: 0 %
EOS ABS: 0 10*3/uL (ref 0–0.7)
EOS PCT: 0 %
HCT: 32.1 % — ABNORMAL LOW (ref 35.0–47.0)
HEMOGLOBIN: 11.1 g/dL — AB (ref 12.0–16.0)
LYMPHS ABS: 1 10*3/uL (ref 1.0–3.6)
LYMPHS PCT: 22 %
MCH: 40.3 pg — AB (ref 26.0–34.0)
MCHC: 34.7 g/dL (ref 32.0–36.0)
MCV: 116.2 fL — AB (ref 80.0–100.0)
MONO ABS: 0.5 10*3/uL (ref 0.2–0.9)
MONOS PCT: 12 %
Neutro Abs: 2.9 10*3/uL (ref 1.4–6.5)
Neutrophils Relative %: 66 %
PLATELETS: 204 10*3/uL (ref 150–440)
RBC: 2.76 MIL/uL — AB (ref 3.80–5.20)
RDW: 13.7 % (ref 11.5–14.5)
WBC: 4.5 10*3/uL (ref 3.6–11.0)

## 2015-11-25 MED ORDER — HEPARIN SOD (PORK) LOCK FLUSH 100 UNIT/ML IV SOLN
500.0000 [IU] | Freq: Once | INTRAVENOUS | Status: AC | PRN
  Administered 2015-11-25: 500 [IU]
  Filled 2015-11-25: qty 5

## 2015-11-25 MED ORDER — SODIUM CHLORIDE 0.9 % IV SOLN
Freq: Once | INTRAVENOUS | Status: AC
  Administered 2015-11-25: 11:00:00 via INTRAVENOUS
  Filled 2015-11-25: qty 1000

## 2015-11-25 MED ORDER — SODIUM CHLORIDE 0.9 % IV SOLN
200.0000 mg/m2 | Freq: Once | INTRAVENOUS | Status: AC
  Administered 2015-11-25: 342 mg via INTRAVENOUS
  Filled 2015-11-25: qty 57

## 2015-11-25 MED ORDER — AMOXICILLIN-POT CLAVULANATE 875-125 MG PO TABS: 1.0000 | ORAL_TABLET | Freq: Two times a day (BID) | ORAL | Status: DC

## 2015-11-25 MED ORDER — SODIUM CHLORIDE 0.9 % IV SOLN
712.8000 mg | Freq: Once | INTRAVENOUS | Status: DC
  Filled 2015-11-25: qty 71

## 2015-11-25 MED ORDER — PALONOSETRON HCL INJECTION 0.25 MG/5ML
0.2500 mg | Freq: Once | INTRAVENOUS | Status: AC
  Administered 2015-11-25: 0.25 mg via INTRAVENOUS
  Filled 2015-11-25: qty 5

## 2015-11-25 MED ORDER — DIPHENHYDRAMINE HCL 50 MG/ML IJ SOLN
50.0000 mg | Freq: Once | INTRAMUSCULAR | Status: AC
  Administered 2015-11-25: 50 mg via INTRAVENOUS
  Filled 2015-11-25: qty 1

## 2015-11-25 MED ORDER — SODIUM CHLORIDE 0.9 % IV SOLN
20.0000 mg | Freq: Once | INTRAVENOUS | Status: AC
  Administered 2015-11-25: 20 mg via INTRAVENOUS
  Filled 2015-11-25: qty 2

## 2015-11-25 MED ORDER — FAMOTIDINE IN NACL 20-0.9 MG/50ML-% IV SOLN
20.0000 mg | Freq: Once | INTRAVENOUS | Status: AC
  Administered 2015-11-25: 20 mg via INTRAVENOUS
  Filled 2015-11-25: qty 50

## 2015-11-25 NOTE — Progress Notes (Signed)
Pt stated she forgot to tell Dr Rogue Bussing about her swollen painful lt breast. Dr Rogue Bussing notified and over to evaluate pt. Carbo to be held for today.

## 2015-11-25 NOTE — Progress Notes (Signed)
[place] CONSULT NOTE  Patient Care Team: Pcp Not In System as PCP - General  CHIEF COMPLAINTS/PURPOSE OF CONSULTATION:   Oncology History   # FEB 2017- ADENOCARCINOMA RUL [T2N2; STAGE III Bronch/FNA- Right paratracheal LN;Dr.Ram]. PET- no distant mets; March 3rd START CARBO-TAXOL Weekly +RT [until may 5th?]  # FEB 2017- MRI Brain 87m focus of met [Right frontal]  # "Chronic pancreatitis"/ smoker [15ppt]     Malignant neoplasm of right upper lobe of lung ([facility]   07/30/2015 Initial Diagnosis Malignant ne[place] of right upper lobe of lung ([facility]     HISTORY OF PRESENTING ILLNESS:  Tiffany Erickson 45y.o.  female  history of stage IV lung cancer adenocarcinoma currently on consolidation chemotherapy- status post cycle 1 is here for follow-up.  Patient Chronic mild shortness of breath chronic fatigue. Denies any fevers or chills.  No cough or hemoptysis. She denies any headaches. No swelling in the legs or arms.   Patient complains of poor appetite. Continues to chronic abdominal discomfort; Chronic nausea. Positive for fatigue. She feels dehydrated.  ROS: A complete 10 point review of system is done which is negative except mentioned above in history of present illness  MEDICAL HISTORY:  Past Medical History  Diagnosis Date  . Chronic pancreatitis ([facility]   . Multiple fractures of ribs of right side 06/01/2015    "5" S/P fall  LEFT SIDE  . Concussion 06/01/2015    S/P fall-PT STATES IN PHONE INTERVIEW THAT SHE IS UNSURE IF SHE HAD A CONCUSSION OR NOT  . Lung mass     right side noted 06/01/2015  . Anxiety   . GERD (gastroesophageal reflux disease)   . Frequent UTI     "sometimes" (06/01/2015)  . Shortness of breath dyspnea     5 rib fx right side  . Lung cancer ([facility] 07/13/15    METASTATIC ADENOCARCINOMA OF THE LUNG  . Broken ribs 05/2015    5 ribs    SURGICAL HISTORY: Past Surgical History  Procedure Laterality Date  . Tonsillectomy    . Myringotomy with  tube placement Bilateral   . Eye muscle surgery Left ~ 1979  . Esophagogastroduodenoscopy    . Endobronchial ultrasound N/A 07/13/2015    Procedure: ENDOBRONCHIAL ULTRASOUND;  Surgeon: PLaverle Hobby MD;  Location: [facility] ORS;  Service: Pulmonary;  Laterality: N/A;  . Portacath placement Left 07/27/2015    Procedure: INSERTION PORT-A-CATH;  Surgeon: TNestor Lewandowsky MD;  Location: [facility] ORS;  Service: General;  Laterality: Left;    SOCIAL HISTORY: Social History   Social History  . Marital Status: Single    Spouse Name: N/A  . Number of Children: N/A  . Years of Education: N/A   Occupational History  . Not on file.   Social History Main Topics  . Smoking status: Current Every Day Smoker -- 0.50 packs/day for 28 years    Types: Cigarettes  . Smokeless tobacco: Never Used  . Alcohol Use: 8.4 oz/week    2 Glasses of wine, 12 Cans of beer per week     Comment: 06/01/2015 "drink probably 4 times/wk; either ;4 beers or 2 glasses of wine when I do drink"  . Drug Use: No  . Sexual Activity: Not Currently   Other Topics Concern  . Not on file   Social History Narrative    FAMILY HISTORY: Family History  Problem Relation Age of Onset  . Colon cancer Maternal Grandfather   . CAD Maternal Grandfather   .  Pancreatic cancer Maternal Grandmother     ALLERGIES:  is allergic to sulfa antibiotics.  MEDICATIONS:  Current Outpatient Prescriptions  Medication Sig Dispense Refill  . ALPRAZolam (XANAX) 1 MG tablet Take 0.5-1 mg by mouth 3 (three) times daily as needed for anxiety.     . lidocaine-prilocaine (EMLA) cream Apply 1 application topically as needed. 30 g 6  . metoCLOPramide (REGLAN) 10 MG tablet Take 1 tablet (10 mg total) by mouth every 8 (eight) hours as needed for nausea or vomiting. 10 tablet 0  . ondansetron (ZOFRAN) 8 MG tablet Take 1 tablet (8 mg total) by mouth every 8 (eight) hours as needed for nausea or vomiting (start 3 days; after chemo). 40 tablet 3  .  oxyCODONE-acetaminophen (PERCOCET/ROXICET) 5-325 MG tablet Take 1 tablet by mouth every 8 (eight) hours as needed for severe pain. 40 tablet 0  . pantoprazole (PROTONIX) 20 MG tablet Take 20 mg by mouth at bedtime.     . potassium chloride SA (K-DUR,KLOR-CON) 20 MEQ tablet Take 2 tablets (40 mEq total) by mouth daily. 30 tablet 1  . prochlorperazine (COMPAZINE) 10 MG tablet Take 1 tablet (10 mg total) by mouth every 6 (six) hours as needed for nausea or vomiting. 40 tablet 1  . amoxicillin-clavulanate (AUGMENTIN) 875-125 MG tablet Take 1 tablet by mouth 2 (two) times daily. 28 tablet 0   No current facility-administered medications for this visit.      Marland Kitchen  PHYSICAL EXAMINATION: ECOG PERFORMANCE STATUS: 0 - Asymptomatic  Filed Vitals:   11/25/15 0959  BP: 101/67  Pulse: 105  Temp: 97.3 F (36.3 C)  Resp: 18   Filed Weights   11/25/15 0959  Weight: 143 lb 1.3 oz (64.9 kg)    GENERAL: Well-nourished well-developed; Alert, no distress and comfortable.She is alone. 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 mediport- no signs of infection.   LABORATORY DATA:  I have reviewed the data as listed Lab Results  Component Value Date   WBC 4.5 11/25/2015   HGB 11.1* 11/25/2015   HCT 32.1* 11/25/2015   MCV 116.2* 11/25/2015   PLT 204 11/25/2015    Recent Labs  10/09/15 0845 11/04/15 0830 11/16/15 0950 11/25/15 0911  NA 140 138 135 138  K  3.2* 3.0* 2.8* 3.1*  CL 105 102 95* 103  CO2 25 25 31 28   GLUCOSE 100* 101* 117* 108*  BUN <5* <5* <5* <5*  CREATININE 0.62 0.53 0.68 0.58  CALCIUM 8.6* 8.4* 8.8* 8.7*  GFRNONAA >60 >60 >60 >60  GFRAA >60 >60 >60 >60  PROT 6.5 6.6  --  6.6  ALBUMIN 3.7 3.7  --  3.5  AST 32 54*  --  25  ALT 25 36  --  15  ALKPHOS 79 91  --  77  BILITOT 0.5 0.7  --  0.5     ASSESSMENT & PLAN:   Malignant neoplasm of right upper lobe of lung ([place])  # STAGE IV [tiny focus of brain met] Adenocarcinoma T2N2;  right upper lobe; proceed with cycle #2 consolidation today.   # Hypokalemia-potassium 3.3; continue taking potassium at home.  # Chronic abdominal pain chronic mild nausea- secondary to chronic pancreatitis not any worse.  # Plan follow-up in 2 weeks; follow up CT scan/MRI brain in approximately 6 weeks from finishing treatment. This will be ordered at next visit in 2 weeks.   Addendum: In the chemotherapy area patient; while getting Taxol- mentioned to the nurse that she had noted swelling and tenderness in the left breast area over the last few days. Tender; she had previous similar episodes- which drained spontaneously. Physical exam reveals- tender area/swollen/erythema- left breast. Not draining. Recommend starting Augmentin. Prescription given. Also spoke to Dr.Byrnett- who kindly agreed to evaluate the patient tomorrow morning at 8:30. I discontinued the carboplatin- which is more immunosuppressive. Hopefully this should avoid significant neutropenia.     Cammie Sickle, MD 11/25/2015 7:51 PM

## 2015-11-25 NOTE — Assessment & Plan Note (Addendum)
#  STAGE IV [place] focus of brain met] Adenocarcinoma T2N2; right upper lobe; proceed with cycle #2 consolidation carbo Taxol today.   # Hypokalemia-potassium 3.3; continue taking potassium at home.  # Chronic abdominal pain chronic mild nausea- secondary to chronic pancreatitis not any worse.  # Plan follow-up in 2 weeks; follow up CT scan/MRI brain in approximately 6 weeks from finishing treatment. This will be ordered at next visit in 2 weeks.

## 2015-11-26 ENCOUNTER — Ambulatory Visit: Payer: Self-pay | Admitting: General Surgery

## 2015-11-27 ENCOUNTER — Ambulatory Visit: Payer: Self-pay | Admitting: General Surgery

## 2015-11-27 ENCOUNTER — Ambulatory Visit (INDEPENDENT_AMBULATORY_CARE_PROVIDER_SITE_OTHER): Payer: BLUE CROSS/BLUE SHIELD | Admitting: General Surgery

## 2015-11-27 ENCOUNTER — Encounter: Payer: Self-pay | Admitting: General Surgery

## 2015-11-27 VITALS — BP 112/68 | HR 70 | Resp 14 | Ht 60.0 in | Wt 147.0 lb

## 2015-11-27 DIAGNOSIS — N611 Abscess of the breast and nipple: Secondary | ICD-10-CM

## 2015-11-27 MED ORDER — DOXYCYCLINE HYCLATE 100 MG PO TABS: 100.0000 mg | ORAL_TABLET | Freq: Two times a day (BID) | ORAL | Status: DC

## 2015-11-27 NOTE — Patient Instructions (Signed)
Return in two weeks.  

## 2015-11-27 NOTE — Progress Notes (Signed)
Patient ID: Tiffany Erickson, female   DOB: [birth year], 45 y.o.   MRN: [id number]  Chief Complaint  Patient presents with  . Other    left breast abscess    HPI Tiffany Erickson is a 45 y.o. female here today for a left breast abscess. Patient states she noticed this area about two weeks ago. The area is red and swollen. Painful with touch.  She has had this problems in the past.she used a heating pack and the area drained on its on .  Patient is in her second round of chemo for lung CA. Has completed radiation.  I have reviewed the history of present illness with the patient.  HPI  Past Medical History  Diagnosis Date  . Chronic pancreatitis ([place])   . Multiple fractures of ribs of right side 06/01/2015    "5" S/P fall  LEFT SIDE  . Concussion 06/01/2015    S/P fall-PT STATES IN PHONE INTERVIEW THAT SHE IS UNSURE IF SHE HAD A CONCUSSION OR NOT  . Lung mass     right side noted 06/01/2015  . Anxiety   . GERD (gastroesophageal reflux disease)   . Frequent UTI     "sometimes" (06/01/2015)  . Shortness of breath dyspnea     5 rib fx right side  . Lung cancer ([place]) 07/13/15    METASTATIC ADENOCARCINOMA OF THE LUNG  . Broken ribs 05/2015    5 ribs    Past Surgical History  Procedure Laterality Date  . Tonsillectomy    . Myringotomy with tube placement Bilateral   . Eye muscle surgery Left ~ 1979  . Esophagogastroduodenoscopy    . Endobronchial ultrasound N/A 07/13/2015    Procedure: ENDOBRONCHIAL ULTRASOUND;  Surgeon: Laverle Hobby, MD;  Location: [facility] ORS;  Service: Pulmonary;  Laterality: N/A;  . Portacath placement Left 07/27/2015    Procedure: INSERTION PORT-A-CATH;  Surgeon: [name], MD;  Location: [facility] ORS;  Service: General;  Laterality: Left;    Family History  Problem Relation Age of Onset  . Colon cancer Maternal Grandfather   . CAD Maternal Grandfather   . Pancreatic cancer Maternal Grandmother     Social History Social History  Substance Use Topics  . Smoking  status: Current Every Day Smoker -- 0.50 packs/day for 28 years    Types: Cigarettes  . Smokeless tobacco: Never Used  . Alcohol Use: 8.4 oz/week    2 Glasses of wine, 12 Cans of beer per week     Comment: 06/01/2015 "drink probably 4 times/wk; either ;4 beers or 2 glasses of wine when I do drink"    Allergies  Allergen Reactions  . Sulfa Antibiotics Other (See Comments)    Unknown-last had as a child    Current Outpatient Prescriptions  Medication Sig Dispense Refill  . ALPRAZolam (XANAX) 1 MG tablet Take 0.5-1 mg by mouth 3 (three) times daily as needed for anxiety.     Marland Kitchen amoxicillin-clavulanate (AUGMENTIN) 875-125 MG tablet Take 1 tablet by mouth 2 (two) times daily. 28 tablet 0  . gabapentin (NEURONTIN) 100 MG capsule Take 100 mg by mouth 3 (three) times daily.    Marland Kitchen lidocaine-prilocaine (EMLA) cream Apply 1 application topically as needed. 30 g 6  . metoCLOPramide (REGLAN) 10 MG tablet Take 1 tablet (10 mg total) by mouth every 8 (eight) hours as needed for nausea or vomiting. 10 tablet 0  . ondansetron (ZOFRAN) 8 MG tablet Take 1 tablet (8 mg total) by mouth every 8 (eight)  hours as needed for nausea or vomiting (start 3 days; after chemo). 40 tablet 3  . oxyCODONE-acetaminophen (PERCOCET/ROXICET) 5-325 MG tablet Take 1 tablet by mouth every 8 (eight) hours as needed for severe pain. 40 tablet 0  . pantoprazole (PROTONIX) 20 MG tablet Take 20 mg by mouth at bedtime.     . potassium chloride SA (K-DUR,KLOR-CON) 20 MEQ tablet Take 2 tablets (40 mEq total) by mouth daily. 30 tablet 1  . prochlorperazine (COMPAZINE) 10 MG tablet Take 1 tablet (10 mg total) by mouth every 6 (six) hours as needed for nausea or vomiting. 40 tablet 1  . promethazine (PHENERGAN) 25 MG tablet Take 25 mg by mouth every 8 (eight) hours as needed for nausea or vomiting.    . sucralfate (CARAFATE) 1 g tablet Take 1 g by mouth 4 (four) times daily -  with meals and at bedtime.    Marland Kitchen doxycycline (VIBRA-TABS) 100 MG  tablet Take 1 tablet (100 mg total) by mouth 2 (two) times daily. 14 tablet 0   No current facility-administered medications for this visit.    Review of Systems Review of Systems  Constitutional: Negative.   Respiratory: Negative.   Cardiovascular: Negative.     Blood pressure 112/68, pulse 70, resp. rate 14, height 5' (1.524 m), weight 147 lb (66.679 kg), last menstrual period 06/29/2015.  Physical Exam Physical Exam  Constitutional: She is oriented to person, place, and time. She appears well-developed and well-nourished.  Eyes: Conjunctivae are normal. No scleral icterus.  Neck: Neck supple.  Cardiovascular: Normal rate and regular rhythm.   Pulmonary/Chest: Right breast exhibits no inverted nipple, no mass, no nipple discharge, no skin change and no tenderness. Left breast exhibits tenderness. Left breast exhibits no inverted nipple, no mass, no nipple discharge and no skin change.    Lymphadenopathy:    She has no cervical adenopathy.    She has no axillary adenopathy.  Neurological: She is alert and oriented to person, place, and time.  Skin: Skin is dry.    Data Reviewed None  Assessment    Left breast abscess. With consent this was aspirated. 68m 1% xylocaine instilled. 18g needle used and 2 ml thick yellowish white pus aspirated.   Advised to use heat to the area 3-4 times a day.     Plan   Rx sent for doxycycline '100mg'$  bid Patient to return in two to three weeks for exam and [place] when she last had a mammogram. Once abscess resolves will image the  breasts  PCP:  [facility]' \\This'$  information has been scribed by JGaspar ColaCMA.  [name] 11/27/2015, 10:11 AM

## 2015-12-01 ENCOUNTER — Telehealth: Payer: Self-pay | Admitting: *Deleted

## 2015-12-01 NOTE — Telephone Encounter (Signed)
Notified patient as instructed, patient pleased. Discussed follow-up appointments, patient agrees  

## 2015-12-01 NOTE — Progress Notes (Signed)
Quick Note:  Inform pt culture showing no growthl. Complete her course of antibiotic  F/u as scheduled ______

## 2015-12-01 NOTE — Telephone Encounter (Signed)
-----   Message from Christene Lye, MD sent at 12/01/2015  7:20 AM EDT ----- Inform pt culture showing no growthl. Complete her course of antibiotic  F/u as scheduled

## 2015-12-03 LAB — ANAEROBIC AND AEROBIC CULTURE

## 2015-12-04 ENCOUNTER — Telehealth: Payer: Self-pay | Admitting: *Deleted

## 2015-12-04 NOTE — Telephone Encounter (Signed)
Drainage down, no clear redness. Pain free. Vomiting w/ doxycycline.  Culture light growth anaerobe: Veillonella species.  Plan: D/C antibiotics. Continue local heat. To report if worsening symptoms.

## 2015-12-04 NOTE — Telephone Encounter (Signed)
She is not taking any amoxicillin. She does have the Zofran and tried that . Did not help.

## 2015-12-04 NOTE — Telephone Encounter (Signed)
Patient called in and states she has been vomiting for two days since she started on doxycyline.  The area on her left breast is still draining but no pain or redness.  culture showing no growth. Patient is Dr. Jamal Collin.

## 2015-12-09 ENCOUNTER — Inpatient Hospital Stay: Payer: BLUE CROSS/BLUE SHIELD | Attending: Internal Medicine

## 2015-12-09 ENCOUNTER — Inpatient Hospital Stay (HOSPITAL_BASED_OUTPATIENT_CLINIC_OR_DEPARTMENT_OTHER): Payer: BLUE CROSS/BLUE SHIELD | Admitting: Internal Medicine

## 2015-12-09 VITALS — BP 113/75 | HR 120 | Temp 97.9°F | Resp 18 | Wt 140.0 lb

## 2015-12-09 DIAGNOSIS — Z9181 History of falling: Secondary | ICD-10-CM | POA: Diagnosis not present

## 2015-12-09 DIAGNOSIS — N611 Abscess of the breast and nipple: Secondary | ICD-10-CM

## 2015-12-09 DIAGNOSIS — Z8781 Personal history of (healed) traumatic fracture: Secondary | ICD-10-CM | POA: Diagnosis not present

## 2015-12-09 DIAGNOSIS — Z923 Personal history of irradiation: Secondary | ICD-10-CM

## 2015-12-09 DIAGNOSIS — K219 Gastro-esophageal reflux disease without esophagitis: Secondary | ICD-10-CM

## 2015-12-09 DIAGNOSIS — C3411 Malignant neoplasm of upper lobe, right bronchus or lung: Secondary | ICD-10-CM

## 2015-12-09 DIAGNOSIS — C771 Secondary and unspecified malignant neoplasm of intrathoracic lymph nodes: Secondary | ICD-10-CM | POA: Diagnosis not present

## 2015-12-09 DIAGNOSIS — F17210 Nicotine dependence, cigarettes, uncomplicated: Secondary | ICD-10-CM

## 2015-12-09 DIAGNOSIS — K861 Other chronic pancreatitis: Secondary | ICD-10-CM | POA: Insufficient documentation

## 2015-12-09 DIAGNOSIS — Z79899 Other long term (current) drug therapy: Secondary | ICD-10-CM | POA: Insufficient documentation

## 2015-12-09 DIAGNOSIS — L598 Other specified disorders of the skin and subcutaneous tissue related to radiation: Secondary | ICD-10-CM | POA: Insufficient documentation

## 2015-12-09 DIAGNOSIS — C7931 Secondary malignant neoplasm of brain: Secondary | ICD-10-CM | POA: Insufficient documentation

## 2015-12-09 DIAGNOSIS — Z95828 Presence of other vascular implants and grafts: Secondary | ICD-10-CM

## 2015-12-09 LAB — CBC WITH DIFFERENTIAL/PLATELET
Basophils Absolute: 0 10*3/uL (ref 0–0.1)
Basophils Relative: 1 %
Eosinophils Absolute: 0.1 10*3/uL (ref 0–0.7)
Eosinophils Relative: 2 %
HEMATOCRIT: 35.8 % (ref 35.0–47.0)
HEMOGLOBIN: 12.2 g/dL (ref 12.0–16.0)
LYMPHS ABS: 1 10*3/uL (ref 1.0–3.6)
LYMPHS PCT: 39 %
MCH: 38.7 pg — AB (ref 26.0–34.0)
MCHC: 34.2 g/dL (ref 32.0–36.0)
MCV: 113.3 fL — ABNORMAL HIGH (ref 80.0–100.0)
MONOS PCT: 22 %
Monocytes Absolute: 0.6 10*3/uL (ref 0.2–0.9)
NEUTROS ABS: 0.9 10*3/uL — AB (ref 1.4–6.5)
NEUTROS PCT: 36 %
Platelets: 387 10*3/uL (ref 150–440)
RBC: 3.16 MIL/uL — ABNORMAL LOW (ref 3.80–5.20)
RDW: 12.7 % (ref 11.5–14.5)
WBC: 2.6 10*3/uL — ABNORMAL LOW (ref 3.6–11.0)

## 2015-12-09 LAB — BASIC METABOLIC PANEL
Anion gap: 7 (ref 5–15)
BUN: 5 mg/dL — ABNORMAL LOW (ref 6–20)
CHLORIDE: 105 mmol/L (ref 101–111)
CO2: 25 mmol/L (ref 22–32)
Calcium: 9 mg/dL (ref 8.9–10.3)
Creatinine, Ser: 0.48 mg/dL (ref 0.44–1.00)
GFR calc non Af Amer: >60 mL/min (ref >60)
GLUCOSE: 117 mg/dL — AB (ref 65–99)
Potassium: 3.5 mmol/L (ref 3.5–5.1)
Sodium: 137 mmol/L (ref 135–145)

## 2015-12-09 LAB — MAGNESIUM: Magnesium: 1.1 mg/dL — ABNORMAL LOW (ref 1.7–2.4)

## 2015-12-09 MED ORDER — LIDOCAINE-PRILOCAINE 2.5-2.5 % EX CREA: 1.0000 "application " | TOPICAL_CREAM | CUTANEOUS | Status: DC | PRN

## 2015-12-09 MED ORDER — AMOXICILLIN-POT CLAVULANATE 875-125 MG PO TABS: 1.0000 | ORAL_TABLET | Freq: Two times a day (BID) | ORAL | Status: DC

## 2015-12-09 NOTE — Assessment & Plan Note (Addendum)
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 No clinical signs of progression. Plan PET scan in 4 weeks;  # Brain metastasis check MRI of the brain.   # Hypokalemia-potassium 3.3; continue taking potassium at home.  # Chronic abdominal pain chronic mild nausea- secondary to chronic pancreatitis not any worse.  # Left breast abcess s/p Drainage; plan agumentin BID.   # PN- grade 1 and stable.  # Plan follow-up in 5 weeks/labs.

## 2015-12-09 NOTE — Progress Notes (Signed)
[place] CONSULT NOTE  Patient Care Team: Elgie Collard, MD as PCP - General (Obstetrics and Gynecology) Christene Lye, MD (General Surgery)  CHIEF COMPLAINTS/PURPOSE OF CONSULTATION:   Oncology History   # FEB 2017- ADENOCARCINOMA RUL [T2N2; STAGE III Bronch/FNA- Right paratracheal LN;Dr.Ram]. PET- no distant mets; March 3rd START CARBO-TAXOL Weekly +RT [until may 5th?]  # FEB 2017- MRI Brain 69m focus of met [Right frontal]  # "Chronic pancreatitis"/ smoker [15ppt]     [place]   07/30/2015 Initial Diagnosis Malignant ne[place] of right [place] lobe of lung ([facility]     HISTORY OF PRESENTING ILLNESS:  Tiffany Erickson 45y.o.  female  history of stage IV lung cancer adenocarcinoma currently on consolidation chemotherapy- status post cycle 2 is here for follow-up.  Patient's carboplatin was held because patient was found to have a breast abscess on the left side. This was drained by surgery/Dr.sankar.  Patient states her breast abscess currently not draining. Otherwise no fevers. Patient Chronic mild shortness of breath chronic fatigue. Denies any fevers or chills.  No cough or hemoptysis. She denies any headaches. No swelling in the legs or arms.   Patient complains of poor appetite. Continues to chronic abdominal discomfort; Chronic nausea. Positive for fatigue.   ROS: A complete 10 point review of system is done which is negative except mentioned above in history of present illness  MEDICAL HISTORY:  Past Medical History  Diagnosis Date  . Chronic pancreatitis ([facility]   . Multiple fractures of ribs of right side 06/01/2015    "5" S/P fall  LEFT SIDE  . Concussion 06/01/2015    S/P fall-PT STATES IN PHONE INTERVIEW THAT SHE IS UNSURE IF SHE HAD A CONCUSSION OR NOT  . Lung mass     right side noted 06/01/2015  . Anxiety   . GERD (gastroesophageal reflux disease)   . Frequent UTI     "sometimes" (06/01/2015)   . Shortness of breath dyspnea     5 rib fx right side  . Lung cancer ([place] 07/13/15    METASTATIC ADENOCARCINOMA OF THE LUNG  . Broken ribs 05/2015    5 ribs    SURGICAL HISTORY: Past Surgical History  Procedure Laterality Date  . Tonsillectomy    . Myringotomy with tube placement Bilateral   . Eye muscle surgery Left ~ 1979  . Esophagogastroduodenoscopy    . Endobronchial ultrasound N/A 07/13/2015    Procedure: ENDOBRONCHIAL ULTRASOUND;  Surgeon: PLaverle Hobby MD;  Location: [facility] ORS;  Service: Pulmonary;  Laterality: N/A;  . Portacath placement Left 07/27/2015    Procedure: INSERTION PORT-A-CATH;  Surgeon: TNestor Lewandowsky MD;  Location: [facility] ORS;  Service: General;  Laterality: Left;    SOCIAL HISTORY: Social History   Social History  . Marital Status: Single    Spouse Name: N/A  . Number of Children: N/A  . Years of Education: N/A   Occupational History  . Not on file.   Social History Main Topics  . Smoking status: Current Every Day Smoker -- 0.50 packs/day for 28 years    Types: Cigarettes  . Smokeless tobacco: Never Used  . Alcohol Use: 8.4 oz/week    2 Glasses of wine, 12 Cans of beer per week     Comment: 06/01/2015 "drink probably 4 times/wk; either ;4 beers or 2 glasses of wine when I do drink"  . Drug Use: No  . Sexual Activity: Not Currently   Other  Topics Concern  . Not on file   Social History Narrative    FAMILY HISTORY: Family History  Problem Relation Age of Onset  . Colon cancer Maternal Grandfather   . CAD Maternal Grandfather   . Pancreatic cancer Maternal Grandmother     ALLERGIES:  is allergic to sulfa antibiotics.  MEDICATIONS:  Current Outpatient Prescriptions  Medication Sig Dispense Refill  . ALPRAZolam (XANAX) 1 MG tablet Take 0.5-1 mg by mouth 3 (three) times daily as needed for anxiety.     . lidocaine-prilocaine (EMLA) cream Apply 1 application topically as needed. 30 g 6  . metoCLOPramide (REGLAN) 10 MG tablet Take 1  tablet (10 mg total) by mouth every 8 (eight) hours as needed for nausea or vomiting. 10 tablet 0  . ondansetron (ZOFRAN) 8 MG tablet Take 1 tablet (8 mg total) by mouth every 8 (eight) hours as needed for nausea or vomiting (start 3 days; after chemo). 40 tablet 3  . oxyCODONE-acetaminophen (PERCOCET/ROXICET) 5-325 MG tablet Take 1 tablet by mouth every 8 (eight) hours as needed for severe pain. 40 tablet 0  . pantoprazole (PROTONIX) 20 MG tablet Take 20 mg by mouth at bedtime.     . potassium chloride SA (K-DUR,KLOR-CON) 20 MEQ tablet Take 2 tablets (40 mEq total) by mouth daily. 30 tablet 1  . prochlorperazine (COMPAZINE) 10 MG tablet Take 1 tablet (10 mg total) by mouth every 6 (six) hours as needed for nausea or vomiting. 40 tablet 1  . promethazine (PHENERGAN) 25 MG tablet Take 25 mg by mouth every 8 (eight) hours as needed for nausea or vomiting.    Marland Kitchen amoxicillin-clavulanate (AUGMENTIN) 875-125 MG tablet Take 1 tablet by mouth 2 (two) times daily. 28 tablet 0  . sucralfate (CARAFATE) 1 g tablet Take 1 g by mouth 4 (four) times daily -  with meals and at bedtime. Reported on 12/09/2015     No current facility-administered medications for this visit.      Marland Kitchen  PHYSICAL EXAMINATION: ECOG PERFORMANCE STATUS: 0 - Asymptomatic  Filed Vitals:   12/09/15 1424  BP: 113/75  Pulse: 120  Temp: 97.9 F (36.6 C)  Resp: 18   Filed Weights   12/09/15 1424  Weight: 139 lb 15.9 oz (63.5 kg)    GENERAL: Well-nourished well-developed; Alert, no distress and comfortable.She is alone. 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 mediport- no signs of infection.  Breast exam-the breast 3:00 position- abscess healing; no drainage.  LABORATORY DATA:  I have reviewed the data as listed Lab Results  Component Value Date   WBC 2.6* 12/09/2015   HGB 12.2 12/09/2015   HCT 35.8 12/09/2015   MCV 113.3* 12/09/2015   PLT 387 12/09/2015    Recent Labs  10/09/15 0845 11/04/15 0830 11/16/15 0950 11/25/15 0911 12/09/15 1414  NA 140 138 135 138 137  K 3.2* 3.0* 2.8* 3.1* 3.5  CL 105 102 95* 103 105  CO2 _0 GLUCOSE 100* 101* 117* 108*  117*  BUN <5* <5* <5* <5* 5*  CREATININE 0.62 0.53 0.68 0.58 0.48  CALCIUM 8.6* 8.4* 8.8* 8.7* 9.0  GFRNONAA >60 >60 >60 >60 >60  GFRAA >60 >60 >60 >60 >60  PROT 6.5 6.6  --  6.6  --   ALBUMIN 3.7 3.7  --  3.5  --   AST 32 54*  --  25  --   ALT 25 36  --  15  --   ALKPHOS 79 91  --  77  --   BILITOT 0.5 0.7  --  0.5  --      ASSESSMENT & PLAN:   Malignant neoplasm of right upper lobe of lung ([facility])  # STAGE IV [tiny focus of brain met] Adenocarcinoma T2N2; right upper lobe; s/p  cycle #2 consolidation taxol alone [carbo held sec to breast abcess]. No clinical signs of progression. Plan PET scan in 4 weeks;  # Brain metastasis check MRI of the brain.   # Hypokalemia-potassium 3.3; continue taking potassium at home.  # Chronic abdominal pain chronic mild nausea- secondary to chronic pancreatitis not any worse.  # Left breast abcess s/p Drainage; plan agumentin BID.   # PN- grade 1 and stable.  # Plan follow-up in 5 weeks/labs.      Cammie Sickle, MD 12/09/2015 6:19 PM

## 2015-12-09 NOTE — Progress Notes (Signed)
Patient requesting refill for EMLA cream.  HR 120.  Patient states when she walks her HR increases.  SOB when walking and talking.

## 2015-12-16 ENCOUNTER — Ambulatory Visit (INDEPENDENT_AMBULATORY_CARE_PROVIDER_SITE_OTHER): Payer: BLUE CROSS/BLUE SHIELD | Admitting: General Surgery

## 2015-12-16 ENCOUNTER — Encounter: Payer: Self-pay | Admitting: General Surgery

## 2015-12-16 VITALS — BP 128/72 | HR 78 | Resp 14 | Ht 62.0 in | Wt 139.0 lb

## 2015-12-16 DIAGNOSIS — N611 Abscess of the breast and nipple: Secondary | ICD-10-CM

## 2015-12-16 NOTE — Patient Instructions (Signed)
The patient is aware to call back for any questions or concerns.  

## 2015-12-16 NOTE — Progress Notes (Signed)
Patient ID: Tiffany Erickson, female   DOB: 04-Oct-1970, 45 y.o.   MRN: [id number]   Here today for follow up left breast abscess. She states it is better, no pain or drainage. She has stopped chemotherapy treatments for now until after PET scan and MRI brain.  I have reviewed the history of present illness with the patient.  Exam shows the left breast abscess has decreased in size leaving a small scabbed area over the skin. Scab was removed and 1/10m of thick white material expressed out-resembles skin cyst content. No redness or induration.  Follow up in 4 weeks with left breast ultrasound.  PCP:  KBayard MalesThis information has been scribed by MKarie FetchRN, BSN,BC.

## 2015-12-17 ENCOUNTER — Encounter: Payer: Self-pay | Admitting: General Surgery

## 2016-01-04 ENCOUNTER — Other Ambulatory Visit: Payer: Self-pay

## 2016-01-04 ENCOUNTER — Telehealth: Payer: Self-pay

## 2016-01-04 DIAGNOSIS — C801 Malignant (primary) neoplasm, unspecified: Secondary | ICD-10-CM

## 2016-01-04 NOTE — Telephone Encounter (Signed)
Patient needs pregnancy test prior to PET scan, lab encounter scheduled for 09:00 tomorrow

## 2016-01-05 ENCOUNTER — Ambulatory Visit
Admission: RE | Admit: 2016-01-05 | Discharge: 2016-01-05 | Disposition: A | Discharge status: Home / Self Care | Payer: BLUE CROSS/BLUE SHIELD | Source: Ambulatory Visit | Attending: Internal Medicine | Admitting: Internal Medicine

## 2016-01-05 ENCOUNTER — Inpatient Hospital Stay: Payer: BLUE CROSS/BLUE SHIELD | Attending: Internal Medicine

## 2016-01-05 ENCOUNTER — Other Ambulatory Visit: Payer: Self-pay

## 2016-01-05 DIAGNOSIS — C3411 Malignant neoplasm of upper lobe, right bronchus or lung: Secondary | ICD-10-CM | POA: Insufficient documentation

## 2016-01-05 DIAGNOSIS — K219 Gastro-esophageal reflux disease without esophagitis: Secondary | ICD-10-CM | POA: Insufficient documentation

## 2016-01-05 DIAGNOSIS — J984 Other disorders of lung: Secondary | ICD-10-CM | POA: Diagnosis not present

## 2016-01-05 DIAGNOSIS — R0602 Shortness of breath: Secondary | ICD-10-CM | POA: Insufficient documentation

## 2016-01-05 DIAGNOSIS — R200 Anesthesia of skin: Secondary | ICD-10-CM | POA: Insufficient documentation

## 2016-01-05 DIAGNOSIS — Z0189 Encounter for other specified special examinations: Secondary | ICD-10-CM | POA: Insufficient documentation

## 2016-01-05 DIAGNOSIS — F419 Anxiety disorder, unspecified: Secondary | ICD-10-CM | POA: Insufficient documentation

## 2016-01-05 DIAGNOSIS — Z452 Encounter for adjustment and management of vascular access device: Secondary | ICD-10-CM | POA: Insufficient documentation

## 2016-01-05 DIAGNOSIS — C801 Malignant (primary) neoplasm, unspecified: Secondary | ICD-10-CM

## 2016-01-05 DIAGNOSIS — R630 Anorexia: Secondary | ICD-10-CM | POA: Insufficient documentation

## 2016-01-05 DIAGNOSIS — R110 Nausea: Secondary | ICD-10-CM | POA: Insufficient documentation

## 2016-01-05 DIAGNOSIS — Z79899 Other long term (current) drug therapy: Secondary | ICD-10-CM | POA: Insufficient documentation

## 2016-01-05 DIAGNOSIS — G8929 Other chronic pain: Secondary | ICD-10-CM | POA: Insufficient documentation

## 2016-01-05 DIAGNOSIS — E876 Hypokalemia: Secondary | ICD-10-CM | POA: Insufficient documentation

## 2016-01-05 DIAGNOSIS — L598 Other specified disorders of the skin and subcutaneous tissue related to radiation: Secondary | ICD-10-CM | POA: Insufficient documentation

## 2016-01-05 DIAGNOSIS — C771 Secondary and unspecified malignant neoplasm of intrathoracic lymph nodes: Secondary | ICD-10-CM | POA: Insufficient documentation

## 2016-01-05 DIAGNOSIS — Z792 Long term (current) use of antibiotics: Secondary | ICD-10-CM | POA: Insufficient documentation

## 2016-01-05 DIAGNOSIS — R51 Headache: Secondary | ICD-10-CM | POA: Insufficient documentation

## 2016-01-05 DIAGNOSIS — Z9889 Other specified postprocedural states: Secondary | ICD-10-CM | POA: Insufficient documentation

## 2016-01-05 DIAGNOSIS — K861 Other chronic pancreatitis: Secondary | ICD-10-CM | POA: Insufficient documentation

## 2016-01-05 DIAGNOSIS — C7931 Secondary malignant neoplasm of brain: Secondary | ICD-10-CM | POA: Insufficient documentation

## 2016-01-05 DIAGNOSIS — Z923 Personal history of irradiation: Secondary | ICD-10-CM | POA: Insufficient documentation

## 2016-01-05 DIAGNOSIS — R1310 Dysphagia, unspecified: Secondary | ICD-10-CM | POA: Insufficient documentation

## 2016-01-05 DIAGNOSIS — F17210 Nicotine dependence, cigarettes, uncomplicated: Secondary | ICD-10-CM | POA: Insufficient documentation

## 2016-01-05 DIAGNOSIS — N611 Abscess of the breast and nipple: Secondary | ICD-10-CM | POA: Insufficient documentation

## 2016-01-05 DIAGNOSIS — Z800 Family history of malignant neoplasm of digestive organs: Secondary | ICD-10-CM | POA: Insufficient documentation

## 2016-01-05 DIAGNOSIS — R5383 Other fatigue: Secondary | ICD-10-CM | POA: Insufficient documentation

## 2016-01-05 DIAGNOSIS — Z9221 Personal history of antineoplastic chemotherapy: Secondary | ICD-10-CM | POA: Insufficient documentation

## 2016-01-05 DIAGNOSIS — Z87440 Personal history of urinary (tract) infections: Secondary | ICD-10-CM | POA: Insufficient documentation

## 2016-01-05 LAB — PREGNANCY, URINE: Preg Test, Ur: NEGATIVE

## 2016-01-05 LAB — GLUCOSE, CAPILLARY: Glucose-Capillary: 115 mg/dL — ABNORMAL HIGH (ref 65–99)

## 2016-01-05 MED ORDER — FLUDEOXYGLUCOSE F - 18 (FDG) INJECTION
12.4200 | Freq: Once | INTRAVENOUS | Status: AC | PRN
  Administered 2016-01-05: 12.42 via INTRAVENOUS

## 2016-01-07 ENCOUNTER — Encounter: Payer: Self-pay | Admitting: General Surgery

## 2016-01-07 ENCOUNTER — Ambulatory Visit (INDEPENDENT_AMBULATORY_CARE_PROVIDER_SITE_OTHER): Payer: BLUE CROSS/BLUE SHIELD | Admitting: General Surgery

## 2016-01-07 VITALS — HR 74 | Resp 16 | Ht 62.0 in | Wt 137.0 lb

## 2016-01-07 DIAGNOSIS — N611 Abscess of the breast and nipple: Secondary | ICD-10-CM

## 2016-01-07 MED ORDER — DOXYCYCLINE HYCLATE 100 MG PO TABS: 100.0000 mg | ORAL_TABLET | Freq: Two times a day (BID) | ORAL | 0 refills | Status: DC

## 2016-01-07 NOTE — Patient Instructions (Signed)
Return to return in one week.

## 2016-01-07 NOTE — Progress Notes (Signed)
Patient ID: Tiffany Erickson, female   DOB: 05/03/1971, 45 y.o.   MRN: [id number]  Chief Complaint  Patient presents with  . Follow-up    left breast abscess    HPI Tiffany Erickson is a 45 y.o. female here today for her follow up on a left breast abscess that was drained on her last visit. Patient states the area is cleared up and she doing well. The area is not painful, draining or red. She notes that she feels an abscess forming under the right nipple. I have reviewed the history of present illness with the patient.  HPI  Past Medical History:  Diagnosis Date  . Anxiety   . Broken ribs 05/2015   5 ribs  . Chronic pancreatitis ([place])   . Concussion 06/01/2015   S/P fall-PT STATES IN PHONE INTERVIEW THAT SHE IS UNSURE IF SHE HAD A CONCUSSION OR NOT  . Frequent UTI    "sometimes" (06/01/2015)  . GERD (gastroesophageal reflux disease)   . Lung cancer ([place]) 07/13/15   METASTATIC ADENOCARCINOMA OF THE LUNG  . Lung mass    right side noted 06/01/2015  . Multiple fractures of ribs of right side 06/01/2015   "5" S/P fall  LEFT SIDE  . Shortness of breath dyspnea    5 rib fx right side    Past Surgical History:  Procedure Laterality Date  . ENDOBRONCHIAL ULTRASOUND N/A 07/13/2015   Procedure: ENDOBRONCHIAL ULTRASOUND;  Surgeon: Laverle Hobby, MD;  Location: [facility] ORS;  Service: Pulmonary;  Laterality: N/A;  . ESOPHAGOGASTRODUODENOSCOPY    . EYE MUSCLE SURGERY Left ~ 1979  . MYRINGOTOMY WITH TUBE PLACEMENT Bilateral   . PORTACATH PLACEMENT Left 07/27/2015   Procedure: INSERTION PORT-A-CATH;  Surgeon: [name], MD;  Location: [facility] ORS;  Service: General;  Laterality: Left;  . TONSILLECTOMY      Family History  Problem Relation Age of Onset  . Colon cancer Maternal Grandfather   . CAD Maternal Grandfather   . Pancreatic cancer Maternal Grandmother     Social History Social History  Substance Use Topics  . Smoking status: Current Every Day Smoker    Packs/day: 0.50    Years:  28.00    Types: Cigarettes  . Smokeless tobacco: Never Used  . Alcohol use 8.4 oz/week    2 Glasses of wine, 12 Cans of beer per week     Comment: 06/01/2015 "drink probably 4 times/wk; either ;4 beers or 2 glasses of wine when I do drink"    Allergies  Allergen Reactions  . Sulfa Antibiotics Other (See Comments)    Unknown-last had as a child    Current Outpatient Prescriptions  Medication Sig Dispense Refill  . ALPRAZolam (XANAX) 1 MG tablet Take 0.5-1 mg by mouth 3 (three) times daily as needed for anxiety.     . lidocaine-prilocaine (EMLA) cream Apply 1 application topically as needed. 30 g 6  . metoCLOPramide (REGLAN) 10 MG tablet Take 1 tablet (10 mg total) by mouth every 8 (eight) hours as needed for nausea or vomiting. 10 tablet 0  . ondansetron (ZOFRAN) 8 MG tablet Take 1 tablet (8 mg total) by mouth every 8 (eight) hours as needed for nausea or vomiting (start 3 days; after chemo). 40 tablet 3  . pantoprazole (PROTONIX) 20 MG tablet Take 20 mg by mouth at bedtime.     . prochlorperazine (COMPAZINE) 10 MG tablet Take 1 tablet (10 mg total) by mouth every 6 (six) hours as needed for nausea or  vomiting. 40 tablet 1  . promethazine (PHENERGAN) 25 MG tablet Take 25 mg by mouth every 8 (eight) hours as needed for nausea or vomiting.    . sucralfate (CARAFATE) 1 g tablet Take 1 g by mouth 4 (four) times daily -  with meals and at bedtime. Reported on 12/09/2015    . doxycycline (VIBRA-TABS) 100 MG tablet Take 1 tablet (100 mg total) by mouth 2 (two) times daily. 14 tablet 0   No current facility-administered medications for this visit.     Review of Systems Review of Systems  Constitutional: Positive for fatigue (one month most chemotherapy end).  Respiratory: Negative.   Cardiovascular: Negative.     Pulse 74, resp. rate 16, height '5\' 2"'$  (1.575 m), weight 137 lb (62.1 kg).  Physical Exam Physical Exam  Constitutional: She is oriented to person, place, and time. She appears  well-developed and well-nourished.  Pulmonary/Chest: No respiratory distress. Right breast exhibits no inverted nipple, no mass, no nipple discharge, no skin change and no tenderness. Left breast exhibits no inverted nipple, no mass, no nipple discharge, no skin change and no tenderness.    Abdominal: There is no tenderness.  Neurological: She is alert and oriented to person, place, and time.  Skin: Skin is warm and dry.    Data Reviewed Prior notes, PET scan Pet scan showed reduced lung tumor size and mets. There is an area of increased uptake behind the right nipple.  Assessment    Left breast abscess, resolved. Some induration around the prior abscess site Right breast abscess     Plan Patient to return in one week. Will re-evaluate right breast abscess at this time.  Patient was provided with prescription for doxycycline to help clear the current abscess      This information has been scribed by Gaspar Cola CMA.  SANKAR,SEEPLAPUTHUR G 01/08/2016, 9:53 AM

## 2016-01-08 ENCOUNTER — Encounter: Payer: Self-pay | Admitting: General Surgery

## 2016-01-08 ENCOUNTER — Ambulatory Visit
Admission: RE | Admit: 2016-01-08 | Discharge: 2016-01-08 | Disposition: A | Discharge status: Home / Self Care | Payer: BLUE CROSS/BLUE SHIELD | Source: Ambulatory Visit | Attending: Internal Medicine | Admitting: Internal Medicine

## 2016-01-08 DIAGNOSIS — C3411 Malignant neoplasm of upper lobe, right bronchus or lung: Secondary | ICD-10-CM | POA: Insufficient documentation

## 2016-01-08 DIAGNOSIS — C7931 Secondary malignant neoplasm of brain: Secondary | ICD-10-CM | POA: Insufficient documentation

## 2016-01-08 MED ORDER — GADOBENATE DIMEGLUMINE 529 MG/ML IV SOLN
15.0000 mL | Freq: Once | INTRAVENOUS | Status: AC | PRN
  Administered 2016-01-08: 12 mL via INTRAVENOUS

## 2016-01-12 ENCOUNTER — Other Ambulatory Visit: Payer: Self-pay | Admitting: *Deleted

## 2016-01-12 DIAGNOSIS — Z85118 Personal history of other malignant neoplasm of bronchus and lung: Secondary | ICD-10-CM

## 2016-01-13 ENCOUNTER — Inpatient Hospital Stay: Payer: BLUE CROSS/BLUE SHIELD

## 2016-01-13 ENCOUNTER — Encounter (INDEPENDENT_AMBULATORY_CARE_PROVIDER_SITE_OTHER): Payer: Self-pay

## 2016-01-13 ENCOUNTER — Inpatient Hospital Stay (HOSPITAL_BASED_OUTPATIENT_CLINIC_OR_DEPARTMENT_OTHER): Payer: BLUE CROSS/BLUE SHIELD | Admitting: Internal Medicine

## 2016-01-13 VITALS — BP 114/80 | HR 114 | Temp 97.2°F | Resp 18 | Wt 133.1 lb

## 2016-01-13 DIAGNOSIS — F419 Anxiety disorder, unspecified: Secondary | ICD-10-CM | POA: Diagnosis not present

## 2016-01-13 DIAGNOSIS — R630 Anorexia: Secondary | ICD-10-CM | POA: Diagnosis not present

## 2016-01-13 DIAGNOSIS — G8929 Other chronic pain: Secondary | ICD-10-CM | POA: Diagnosis not present

## 2016-01-13 DIAGNOSIS — R5383 Other fatigue: Secondary | ICD-10-CM

## 2016-01-13 DIAGNOSIS — Z452 Encounter for adjustment and management of vascular access device: Secondary | ICD-10-CM | POA: Diagnosis not present

## 2016-01-13 DIAGNOSIS — E876 Hypokalemia: Secondary | ICD-10-CM | POA: Diagnosis not present

## 2016-01-13 DIAGNOSIS — L598 Other specified disorders of the skin and subcutaneous tissue related to radiation: Secondary | ICD-10-CM

## 2016-01-13 DIAGNOSIS — R0602 Shortness of breath: Secondary | ICD-10-CM

## 2016-01-13 DIAGNOSIS — N611 Abscess of the breast and nipple: Secondary | ICD-10-CM | POA: Diagnosis not present

## 2016-01-13 DIAGNOSIS — Z85118 Personal history of other malignant neoplasm of bronchus and lung: Secondary | ICD-10-CM

## 2016-01-13 DIAGNOSIS — C771 Secondary and unspecified malignant neoplasm of intrathoracic lymph nodes: Secondary | ICD-10-CM | POA: Diagnosis not present

## 2016-01-13 DIAGNOSIS — Z9221 Personal history of antineoplastic chemotherapy: Secondary | ICD-10-CM | POA: Diagnosis not present

## 2016-01-13 DIAGNOSIS — C7931 Secondary malignant neoplasm of brain: Secondary | ICD-10-CM | POA: Diagnosis not present

## 2016-01-13 DIAGNOSIS — K861 Other chronic pancreatitis: Secondary | ICD-10-CM

## 2016-01-13 DIAGNOSIS — Z792 Long term (current) use of antibiotics: Secondary | ICD-10-CM

## 2016-01-13 DIAGNOSIS — Z923 Personal history of irradiation: Secondary | ICD-10-CM

## 2016-01-13 DIAGNOSIS — K219 Gastro-esophageal reflux disease without esophagitis: Secondary | ICD-10-CM | POA: Diagnosis not present

## 2016-01-13 DIAGNOSIS — R51 Headache: Secondary | ICD-10-CM

## 2016-01-13 DIAGNOSIS — Z9889 Other specified postprocedural states: Secondary | ICD-10-CM | POA: Diagnosis not present

## 2016-01-13 DIAGNOSIS — R200 Anesthesia of skin: Secondary | ICD-10-CM | POA: Diagnosis not present

## 2016-01-13 DIAGNOSIS — C3411 Malignant neoplasm of upper lobe, right bronchus or lung: Secondary | ICD-10-CM

## 2016-01-13 DIAGNOSIS — F17210 Nicotine dependence, cigarettes, uncomplicated: Secondary | ICD-10-CM | POA: Diagnosis not present

## 2016-01-13 DIAGNOSIS — R110 Nausea: Secondary | ICD-10-CM

## 2016-01-13 DIAGNOSIS — R1310 Dysphagia, unspecified: Secondary | ICD-10-CM | POA: Diagnosis not present

## 2016-01-13 DIAGNOSIS — Z79899 Other long term (current) drug therapy: Secondary | ICD-10-CM | POA: Diagnosis not present

## 2016-01-13 LAB — CBC WITH DIFFERENTIAL/PLATELET
Basophils Absolute: 0.1 10*3/uL (ref 0–0.1)
Basophils Relative: 1 %
EOS ABS: 0.1 10*3/uL (ref 0–0.7)
Eosinophils Relative: 2 %
HCT: 44.7 % (ref 35.0–47.0)
Hemoglobin: 15.1 g/dL (ref 12.0–16.0)
LYMPHS PCT: 22 %
Lymphs Abs: 1.3 10*3/uL (ref 1.0–3.6)
MCH: 35.9 pg — AB (ref 26.0–34.0)
MCHC: 33.8 g/dL (ref 32.0–36.0)
MCV: 106.3 fL — AB (ref 80.0–100.0)
MONO ABS: 0.5 10*3/uL (ref 0.2–0.9)
Monocytes Relative: 9 %
NEUTROS ABS: 3.9 10*3/uL (ref 1.4–6.5)
Neutrophils Relative %: 66 %
PLATELETS: 288 10*3/uL (ref 150–440)
RBC: 4.21 MIL/uL (ref 3.80–5.20)
RDW: 14.1 % (ref 11.5–14.5)
WBC: 5.9 10*3/uL (ref 3.6–11.0)

## 2016-01-13 LAB — BASIC METABOLIC PANEL
Anion gap: 8 (ref 5–15)
BUN: <5 mg/dL — ABNORMAL LOW (ref 6–20)
CHLORIDE: 99 mmol/L — AB (ref 101–111)
CO2: 28 mmol/L (ref 22–32)
Calcium: 8.3 mg/dL — ABNORMAL LOW (ref 8.9–10.3)
Creatinine, Ser: 0.54 mg/dL (ref 0.44–1.00)
GFR calc non Af Amer: >60 mL/min (ref >60)
Glucose, Bld: 106 mg/dL — ABNORMAL HIGH (ref 65–99)
POTASSIUM: 2.8 mmol/L — AB (ref 3.5–5.1)
SODIUM: 135 mmol/L (ref 135–145)

## 2016-01-13 LAB — MAGNESIUM: MAGNESIUM: 1.1 mg/dL — AB (ref 1.7–2.4)

## 2016-01-13 MED ORDER — SODIUM CHLORIDE 0.9% FLUSH
10.0000 mL | INTRAVENOUS | Status: DC | PRN
  Administered 2016-01-13: 10 mL via INTRAVENOUS
  Filled 2016-01-13: qty 10

## 2016-01-13 MED ORDER — DEXAMETHASONE 4 MG PO TABS: 4.0000 mg | ORAL_TABLET | Freq: Two times a day (BID) | ORAL | 1 refills | Status: DC

## 2016-01-13 MED ORDER — HEPARIN SOD (PORK) LOCK FLUSH 100 UNIT/ML IV SOLN
500.0000 [IU] | Freq: Once | INTRAVENOUS | Status: AC
  Administered 2016-01-13: 500 [IU] via INTRAVENOUS

## 2016-01-13 NOTE — Progress Notes (Signed)
Patient here today for MRI results.

## 2016-01-13 NOTE — Assessment & Plan Note (Signed)
#  STAGE IV Chucky May focus of brain met] Adenocarcinoma T2N2; right upper lobe; s/p  cycle #2 consolidation taxol alone [carbo held sec to breast abcess]. No clinical signs of progression.  PET- partial response.   # Brain metastasis- MRI of the brain- three new mets. Will decadron bid. Discussed with Dr.Crystal.    # Hypokalemia-potassium 2.8; continue taking potassium at home.  # Chronic abdominal pain chronic mild nausea- secondary to chronic pancreatitis not any worse.  # Left breast abcess s/p Drainage; Right breast abcess- start augumentin.   # PN- grade 1 and stable.  # Plan follow-up in 3 weeks/labs.    I reviewed the images myself and with the patient and family in detail. A copy of this report was given.

## 2016-01-13 NOTE — Progress Notes (Signed)
[facility] CONSULT NOTE  Patient Care Team: Elgie Collard, MD as PCP - General (Obstetrics and Gynecology) Christene Lye, MD (General Surgery)  CHIEF COMPLAINTS/PURPOSE OF CONSULTATION:   Oncology History   # FEB 2017- ADENOCARCINOMA RUL [T2N2; STAGE III Bronch/FNA- Right paratracheal LN;Dr.Ram]. PET- no distant mets; March 3rd START CARBO-TAXOL Weekly +RT [until may 5th?]; June 21st 2017- last dose of consolidation carbo-taxol; Jan 12 2016- PET- Improved on RUL mass/LN  # FEB 2017- MRI Brain 63m focus of met [Right frontal]; Jan 12 2016- 3 new brain mets.   # left breast abscess; Aug 2017- PET ? Right Breast abcess  # "Chronic pancreatitis"/ smoker [15ppt]     [place]   07/30/2015 Initial Diagnosis    [place]       HISTORY OF PRESENTING ILLNESS:  Tiffany Erickson 45y.o.  female  history of stage IV lung cancer adenocarcinoma currently Status post consolidation chemotherapy is here to review the results of her PET scan and also brain MRI.  Patient complains of intermittent headaches during the daytime. She notes she has swelling of the right breast; she had been on doxycycline which was causing her stomach upset.    Otherwise no fevers. Patient Chronic mild shortness of breath chronic fatigue. Denies any fevers or chills.  No cough or hemoptysis. No swelling in the legs or arms.   Patient complains of poor appetite. Continues to chronic abdominal discomfort; Chronic nausea. Positive for fatigue.   ROS: A complete 10 point review of system is done which is negative except mentioned above in history of present illness  MEDICAL HISTORY:  Past Medical History:  Diagnosis Date  . Anxiety   . Broken ribs 05/2015   5 ribs  . Chronic pancreatitis ([place]   . Concussion 06/01/2015   S/P fall-PT STATES IN PHONE INTERVIEW THAT SHE IS UNSURE IF SHE HAD A CONCUSSION OR NOT   . Frequent UTI    "sometimes" (06/01/2015)  . GERD (gastroesophageal reflux disease)   . Lung cancer ([place] 07/13/15   METASTATIC ADENOCARCINOMA OF THE LUNG  . Lung mass    right side noted 06/01/2015  . Multiple fractures of ribs of right side 06/01/2015   "5" S/P fall  LEFT SIDE  . Shortness of breath dyspnea    5 rib fx right side    SURGICAL HISTORY: Past Surgical History:  Procedure Laterality Date  . ENDOBRONCHIAL ULTRASOUND N/A 07/13/2015   Procedure: ENDOBRONCHIAL ULTRASOUND;  Surgeon: PLaverle Hobby MD;  Location: [facility] ORS;  Service: Pulmonary;  Laterality: N/A;  . ESOPHAGOGASTRODUODENOSCOPY    . EYE MUSCLE SURGERY Left ~ 1979  . MYRINGOTOMY WITH TUBE PLACEMENT Bilateral   . PORTACATH PLACEMENT Left 07/27/2015   Procedure: INSERTION PORT-A-CATH;  Surgeon: TNestor Lewandowsky MD;  Location: [facility] ORS;  Service: General;  Laterality: Left;  . TONSILLECTOMY      SOCIAL HISTORY: Social History   Social History  . Marital status: Single    Spouse name: N/A  . Number of children: N/A  . Years of education: N/A   Occupational History  . Not on file.   Social History Main Topics  . Smoking status: Current Every Day Smoker    Packs/day: 0.50    Years: 28.00    Types: Cigarettes  . Smokeless tobacco: Never Used  . Alcohol use 8.4 oz/week    2 Glasses of wine, 12 Cans of  beer per week     Comment: 06/01/2015 "drink probably 4 times/wk; either ;4 beers or 2 glasses of wine when I do drink"  . Drug use: No  . Sexual activity: Not Currently   Other Topics Concern  . Not on file   Social History Narrative  . No narrative on file    FAMILY HISTORY: Family History  Problem Relation Age of Onset  . Colon cancer Maternal Grandfather   . CAD Maternal Grandfather   . Pancreatic cancer Maternal Grandmother     ALLERGIES:  is allergic to sulfa antibiotics.  MEDICATIONS:  Current Outpatient Prescriptions  Medication Sig Dispense Refill  . ALPRAZolam (XANAX) 1 MG  tablet Take 0.5-1 mg by mouth 3 (three) times daily as needed for anxiety.     . lidocaine-prilocaine (EMLA) cream Apply 1 application topically as needed. 30 g 6  . ondansetron (ZOFRAN) 8 MG tablet Take 1 tablet (8 mg total) by mouth every 8 (eight) hours as needed for nausea or vomiting (start 3 days; after chemo). 40 tablet 3  . pantoprazole (PROTONIX) 20 MG tablet Take 20 mg by mouth at bedtime.     . prochlorperazine (COMPAZINE) 10 MG tablet Take 1 tablet (10 mg total) by mouth every 6 (six) hours as needed for nausea or vomiting. 40 tablet 1  . promethazine (PHENERGAN) 25 MG tablet Take 25 mg by mouth every 8 (eight) hours as needed for nausea or vomiting.    Marland Kitchen zolpidem (AMBIEN) 5 MG tablet Take 5 mg by mouth at bedtime as needed for sleep.    Marland Kitchen dexamethasone (DECADRON) 4 MG tablet Take 1 tablet (4 mg total) by mouth 2 (two) times daily with a meal. 30 tablet 1  . metoCLOPramide (REGLAN) 10 MG tablet Take 1 tablet (10 mg total) by mouth every 8 (eight) hours as needed for nausea or vomiting. (Patient not taking: Reported on 01/13/2016) 10 tablet 0   No current facility-administered medications for this visit.       Marland Kitchen  PHYSICAL EXAMINATION: ECOG PERFORMANCE STATUS: 0 - Asymptomatic  Vitals:   01/13/16 1406  BP: 114/80  Pulse: (!) 114  Resp: 18  Temp: 97.2 F (36.2 C)   Filed Weights   01/13/16 1406  Weight: 133 lb 2 oz (60.4 kg)    GENERAL: Well-nourished well-developed; Alert, no distress and comfortable.She is Accompanied by her aunt/mother. EYES: no pallor or icterus OROPHARYNX: no thrush or ulceration; good dentition  NECK: supple, no masses felt LYMPH:  no palpable lymphadenopathy in the cervical, axillary or inguinal regions LUNGS: clear to auscultation and  No wheeze or crackles HEART/CVS: regular rate & rhythm and no murmurs; No lower extremity edema ABDOMEN: abdomen soft, non-tender and normal bowel sounds Musculoskeletal:no cyanosis of digits and no clubbing   PSYCH: alert & oriented x 3 with fluent speech; anxious NEURO: no focal motor/sensory deficits SKIN: 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 mediport- no signs of infection.   LABORATORY DATA:  I have reviewed the data as listed Lab Results  Component Value Date   WBC 5.9 01/13/2016   HGB 15.1 01/13/2016   HCT 44.7 01/13/2016   MCV 106.3 (H) 01/13/2016   PLT 288 01/13/2016    Recent Labs  10/09/15 0845 11/04/15 0830  11/25/15 0911 12/09/15 1414 01/13/16 1352  NA 140 138  < > 138 137 135  K 3.2* 3.0*  < > 3.1* 3.5 2.8*  CL 105 102  < > 103 105 99*  CO2 25  25  < > _0 GLUCOSE 100* 101*  < > 108* 117* 106*  BUN <5* <5*  < > <5* 5* <5*  CREATININE 0.62 0.53  < > 0.58 0.48 0.54  CALCIUM 8.6* 8.4*  < > 8.7* 9.0 8.3*  GFRNONAA >60 >60  < > >60 >60 >60  GFRAA >60 >60  < > >60 >60 >60  PROT 6.5 6.6  --  6.6  --   --   ALBUMIN 3.7 3.7  --  3.5  --   --   AST 32 54*  --  25  --   --   ALT 25 36  --  15  --   --   ALKPHOS 79 91  --  77  --   --   BILITOT 0.5 0.7  --  0.5  --   --   < > = values in this interval not displayed.   ASSESSMENT & PLAN:   Malignant neoplasm of right upper lobe of lung ([place])  # STAGE IV [tiny focus of brain met] Adenocarcinoma T2N2; right upper lobe; s/p  cycle #2 consolidation taxol alone [carbo held sec to breast abcess]. No clinical signs of progression.  PET- partial response.   # Brain metastasis- MRI of the brain- three new mets. Will decadron bid. Discussed with Dr.Crystal.    # Hypokalemia-potassium 2.8; continue taking potassium at home.  # Chronic abdominal pain chronic mild nausea- secondary to chronic pancreatitis not any worse.  # Left breast abcess s/p Drainage; Right breast abcess- start augumentin.   # PN- grade 1 and stable.  # Plan follow-up in 3 weeks/labs.    I reviewed the images myself and with the patient and family in detail. A copy of this report was given.     Cammie Sickle, MD 01/13/2016  5:40 PM

## 2016-01-14 ENCOUNTER — Encounter (INDEPENDENT_AMBULATORY_CARE_PROVIDER_SITE_OTHER): Payer: Self-pay

## 2016-01-14 ENCOUNTER — Ambulatory Visit
Admission: RE | Admit: 2016-01-14 | Discharge: 2016-01-14 | Disposition: A | Discharge status: Home / Self Care | Payer: BLUE CROSS/BLUE SHIELD | Source: Ambulatory Visit | Attending: Radiation Oncology | Admitting: Radiation Oncology

## 2016-01-14 ENCOUNTER — Encounter: Payer: Self-pay | Admitting: Radiation Oncology

## 2016-01-14 ENCOUNTER — Telehealth: Payer: Self-pay | Admitting: *Deleted

## 2016-01-14 VITALS — BP 108/72 | HR 115 | Temp 96.3°F | Resp 20 | Wt 133.5 lb

## 2016-01-14 DIAGNOSIS — C7931 Secondary malignant neoplasm of brain: Secondary | ICD-10-CM | POA: Diagnosis present

## 2016-01-14 DIAGNOSIS — Z9221 Personal history of antineoplastic chemotherapy: Secondary | ICD-10-CM | POA: Diagnosis not present

## 2016-01-14 DIAGNOSIS — Z510 Encounter for antineoplastic radiation therapy: Secondary | ICD-10-CM | POA: Insufficient documentation

## 2016-01-14 DIAGNOSIS — C3490 Malignant neoplasm of unspecified part of unspecified bronchus or lung: Secondary | ICD-10-CM | POA: Diagnosis not present

## 2016-01-14 DIAGNOSIS — R51 Headache: Secondary | ICD-10-CM | POA: Insufficient documentation

## 2016-01-14 NOTE — Progress Notes (Signed)
Radiation Oncology Follow up Note  Name: Tiffany Erickson   Date:   01/14/2016 MRN:  [id number] DOB: 1970/06/18    This 45 y.o. female presents to the clinic today for salvage radiation therapy for brain metastasis for stage IV non-small cell lung cancer.  REFERRING PROVIDER: Elgie Collard, MD  HPI: Patient is a 44 year old female now out 3 months having completed both radiation therapy to her chest and brain for solitary brain metastasis for stage IV non-small cell lung cancer. We treated with I am RT treatment to her single brain metastasis. She was on consolidative chemotherapy with carbotaxol. She was doing well. PET showed a good partial response. Unfortunately recent MRI scan of the brain showed 3 new metastatic lesions and she has been placed on Decadron she is having mild headaches no other change in neurologic status. No motor or sensory levels are noted. No change in visual fields. These lesions are near the vertex largest measuring 9 mm. I been asked to evaluate her for possible salvage radiation therapy.  COMPLICATIONS OF TREATMENT: none  FOLLOW UP COMPLIANCE: keeps appointments   PHYSICAL EXAM:  BP 108/72   Pulse (!) 115   Temp (!) 96.3 F (35.7 C)   Resp 20   Wt 133 lb 7.8 oz (60.6 kg)   LMP 06/07/2015 (Within Weeks)   BMI 24.42 kg/m  Well-developed well-nourished patient in NAD. HEENT reveals PERLA, EOMI, discs not visualized.  Oral cavity is clear. No oral mucosal lesions are identified. Neck is clear without evidence of cervical or supraclavicular adenopathy. Lungs are clear to A&P. Cardiac examination is essentially unremarkable with regular rate and rhythm without murmur rub or thrill. Abdomen is benign with no organomegaly or masses noted. Motor sensory and DTR levels are equal and symmetric in the upper and lower extremities. Cranial nerves II through XII are grossly intact. Proprioception is intact. No peripheral adenopathy or edema is identified. No motor or sensory  levels are noted. Crude visual fields are within normal range.  RADIOLOGY RESULTS: MRI scan and PET scan are both reviewed  PLAN: At this time unfortunate she's had an excellent response as far as disease in her chest is concerned by PET CT criteria. She does have 3 new metastatic lesions of the brain and I like to go ahead with salvage radiation therapy to her whole brain. Risks and benefits of treatment including again hair loss fatigue skin reaction and higher possibility of brain necrosis secondary to previous radiation treatments were all discussed in detail with the patient. I would treat at a reduced dose of 3060 cGy in 17 fractions is at 180 cGy per fraction again to try and prevent cognitive decline and secondary to prior limited field radiation to her brain. I personally ordered and set up CT simulation. She will start her Decadron since her prescription is filled.  I would like to take this opportunity to thank you for allowing me to participate in the care of your patient.Armstead Peaks., MD

## 2016-01-14 NOTE — Telephone Encounter (Signed)
Called patient and LVM that she should take Mag 400 mg bid.

## 2016-01-14 NOTE — Telephone Encounter (Signed)
-----   Message from Cammie Sickle, MD sent at 01/13/2016  6:52 PM EDT ----- Also inform pt to take mag 400 mg BID.

## 2016-01-18 ENCOUNTER — Ambulatory Visit: Payer: BLUE CROSS/BLUE SHIELD

## 2016-01-18 ENCOUNTER — Ambulatory Visit: Payer: BLUE CROSS/BLUE SHIELD | Admitting: General Surgery

## 2016-01-26 ENCOUNTER — Ambulatory Visit: Payer: BLUE CROSS/BLUE SHIELD | Admitting: General Surgery

## 2016-01-26 ENCOUNTER — Ambulatory Visit
Admission: RE | Admit: 2016-01-26 | Discharge: 2016-01-26 | Disposition: A | Discharge status: Home / Self Care | Payer: BLUE CROSS/BLUE SHIELD | Source: Ambulatory Visit | Attending: Radiation Oncology | Admitting: Radiation Oncology

## 2016-01-26 DIAGNOSIS — C7931 Secondary malignant neoplasm of brain: Secondary | ICD-10-CM | POA: Diagnosis not present

## 2016-01-27 DIAGNOSIS — C7931 Secondary malignant neoplasm of brain: Secondary | ICD-10-CM | POA: Diagnosis not present

## 2016-01-29 ENCOUNTER — Other Ambulatory Visit: Payer: Self-pay | Admitting: *Deleted

## 2016-01-29 DIAGNOSIS — C7931 Secondary malignant neoplasm of brain: Secondary | ICD-10-CM

## 2016-02-01 DIAGNOSIS — C7931 Secondary malignant neoplasm of brain: Secondary | ICD-10-CM | POA: Diagnosis not present

## 2016-02-02 ENCOUNTER — Other Ambulatory Visit: Payer: Self-pay | Admitting: *Deleted

## 2016-02-02 DIAGNOSIS — C3490 Malignant neoplasm of unspecified part of unspecified bronchus or lung: Secondary | ICD-10-CM

## 2016-02-03 ENCOUNTER — Ambulatory Visit
Admission: RE | Admit: 2016-02-03 | Discharge: 2016-02-03 | Disposition: A | Discharge status: Home / Self Care | Payer: BLUE CROSS/BLUE SHIELD | Source: Ambulatory Visit | Attending: Radiation Oncology | Admitting: Radiation Oncology

## 2016-02-03 ENCOUNTER — Inpatient Hospital Stay (HOSPITAL_BASED_OUTPATIENT_CLINIC_OR_DEPARTMENT_OTHER): Payer: BLUE CROSS/BLUE SHIELD | Admitting: Internal Medicine

## 2016-02-03 ENCOUNTER — Inpatient Hospital Stay: Payer: BLUE CROSS/BLUE SHIELD

## 2016-02-03 VITALS — BP 117/79 | HR 80 | Temp 97.0°F | Wt 131.2 lb

## 2016-02-03 DIAGNOSIS — Z923 Personal history of irradiation: Secondary | ICD-10-CM

## 2016-02-03 DIAGNOSIS — L598 Other specified disorders of the skin and subcutaneous tissue related to radiation: Secondary | ICD-10-CM

## 2016-02-03 DIAGNOSIS — R200 Anesthesia of skin: Secondary | ICD-10-CM

## 2016-02-03 DIAGNOSIS — R51 Headache: Secondary | ICD-10-CM

## 2016-02-03 DIAGNOSIS — G8929 Other chronic pain: Secondary | ICD-10-CM

## 2016-02-03 DIAGNOSIS — C3490 Malignant neoplasm of unspecified part of unspecified bronchus or lung: Secondary | ICD-10-CM

## 2016-02-03 DIAGNOSIS — K861 Other chronic pancreatitis: Secondary | ICD-10-CM

## 2016-02-03 DIAGNOSIS — F419 Anxiety disorder, unspecified: Secondary | ICD-10-CM

## 2016-02-03 DIAGNOSIS — Z9221 Personal history of antineoplastic chemotherapy: Secondary | ICD-10-CM | POA: Diagnosis not present

## 2016-02-03 DIAGNOSIS — C3411 Malignant neoplasm of upper lobe, right bronchus or lung: Secondary | ICD-10-CM | POA: Diagnosis not present

## 2016-02-03 DIAGNOSIS — R1310 Dysphagia, unspecified: Secondary | ICD-10-CM

## 2016-02-03 DIAGNOSIS — C771 Secondary and unspecified malignant neoplasm of intrathoracic lymph nodes: Secondary | ICD-10-CM

## 2016-02-03 DIAGNOSIS — R630 Anorexia: Secondary | ICD-10-CM

## 2016-02-03 DIAGNOSIS — Z79899 Other long term (current) drug therapy: Secondary | ICD-10-CM

## 2016-02-03 DIAGNOSIS — C7931 Secondary malignant neoplasm of brain: Secondary | ICD-10-CM

## 2016-02-03 DIAGNOSIS — Z79890 Hormone replacement therapy (postmenopausal): Secondary | ICD-10-CM

## 2016-02-03 DIAGNOSIS — N611 Abscess of the breast and nipple: Secondary | ICD-10-CM

## 2016-02-03 DIAGNOSIS — Z792 Long term (current) use of antibiotics: Secondary | ICD-10-CM

## 2016-02-03 DIAGNOSIS — R110 Nausea: Secondary | ICD-10-CM

## 2016-02-03 DIAGNOSIS — R5383 Other fatigue: Secondary | ICD-10-CM

## 2016-02-03 DIAGNOSIS — E876 Hypokalemia: Secondary | ICD-10-CM

## 2016-02-03 DIAGNOSIS — F17210 Nicotine dependence, cigarettes, uncomplicated: Secondary | ICD-10-CM

## 2016-02-03 DIAGNOSIS — K219 Gastro-esophageal reflux disease without esophagitis: Secondary | ICD-10-CM

## 2016-02-03 LAB — CBC WITH DIFFERENTIAL/PLATELET
BASOS PCT: 0 %
Basophils Absolute: 0 10*3/uL (ref 0–0.1)
EOS ABS: 0 10*3/uL (ref 0–0.7)
Eosinophils Relative: 0 %
HEMATOCRIT: 45.8 % (ref 35.0–47.0)
HEMOGLOBIN: 15.7 g/dL (ref 12.0–16.0)
LYMPHS ABS: 0.5 10*3/uL — AB (ref 1.0–3.6)
Lymphocytes Relative: 3 %
MCH: 35.2 pg — AB (ref 26.0–34.0)
MCHC: 34.3 g/dL (ref 32.0–36.0)
MCV: 102.7 fL — ABNORMAL HIGH (ref 80.0–100.0)
Monocytes Absolute: 0.9 10*3/uL (ref 0.2–0.9)
Monocytes Relative: 6 %
NEUTROS ABS: 14.4 10*3/uL — AB (ref 1.4–6.5)
NEUTROS PCT: 91 %
Platelets: 173 10*3/uL (ref 150–440)
RBC: 4.46 MIL/uL (ref 3.80–5.20)
RDW: 13.5 % (ref 11.5–14.5)
WBC: 15.8 10*3/uL — AB (ref 3.6–11.0)

## 2016-02-03 LAB — BASIC METABOLIC PANEL
Anion gap: 8 (ref 5–15)
BUN: 13 mg/dL (ref 6–20)
CHLORIDE: 95 mmol/L — AB (ref 101–111)
CO2: 26 mmol/L (ref 22–32)
Calcium: 8.5 mg/dL — ABNORMAL LOW (ref 8.9–10.3)
Creatinine, Ser: 0.48 mg/dL (ref 0.44–1.00)
GFR calc non Af Amer: >60 mL/min (ref >60)
Glucose, Bld: 127 mg/dL — ABNORMAL HIGH (ref 65–99)
POTASSIUM: 3.2 mmol/L — AB (ref 3.5–5.1)
SODIUM: 129 mmol/L — AB (ref 135–145)

## 2016-02-03 LAB — MAGNESIUM: MAGNESIUM: 1.9 mg/dL (ref 1.7–2.4)

## 2016-02-03 MED ORDER — HYDROCODONE-ACETAMINOPHEN 5-325 MG PO TABS
1.0000 | ORAL_TABLET | Freq: Three times a day (TID) | ORAL | 0 refills | Status: DC | PRN
Start: 2016-02-03 — End: 2016-05-19

## 2016-02-03 MED ORDER — PREGABALIN 75 MG PO CAPS: 75.0000 mg | ORAL_CAPSULE | Freq: Two times a day (BID) | ORAL | 3 refills | Status: DC

## 2016-02-03 MED ORDER — NYSTATIN 100000 UNIT/ML MT SUSP: 5.0000 mL | Freq: Four times a day (QID) | OROMUCOSAL | 0 refills | Status: DC

## 2016-02-03 MED ORDER — FLUCONAZOLE 100 MG PO TABS: 100.0000 mg | ORAL_TABLET | Freq: Every day | ORAL | 0 refills | Status: DC

## 2016-02-03 NOTE — Assessment & Plan Note (Addendum)
#  STAGE IV Chucky May focus of brain met] Adenocarcinoma T2N2; right upper lobe; s/p  cycle #2 consolidation taxol alone [carbo held sec to breast abcess]. No clinical signs of progression. AUg 1st 2017-PET- partial response.   # Brain metastasis- MRI of the brain- three new mets. Start RT- 8/31   # Hypokalemia-potassium 3.2/ declines k supp sec to pain/ recom nutrition supp.   # Chronic abdominal pain chronic mild nausea- secondary to chronic pancreatitis not any worse.  # Left breast abcess s/p Drainage; Right breast abcess- improved.   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 lyrica & hydrocodone   # Dysphagia/ Thrush- diflucan 100 mg/day x7 days; Nystatin.   # follow up in 4 weeks/ labs/port flush.

## 2016-02-03 NOTE — Progress Notes (Signed)
Patient states the bottom of her feet hurts.  She feels like she is walking on knives or glass. Pain level 10/10.

## 2016-02-03 NOTE — Progress Notes (Signed)
[facility] CONSULT NOTE  Patient Care Team: Elgie Collard, MD as PCP - General (Obstetrics and Gynecology) Christene Lye, MD (General Surgery)  CHIEF COMPLAINTS/PURPOSE OF CONSULTATION:   Oncology History   # FEB 2017- ADENOCARCINOMA RUL [T2N2; STAGE III Bronch/FNA- Right paratracheal LN;Dr.Ram]. PET- no distant mets; March 3rd START CARBO-TAXOL Weekly +RT [until may 5th?]; June 21st 2017- last dose of consolidation carbo-taxol; Jan 12 2016- PET- Improved on RUL mass/LN  # FEB 2017- MRI Brain 62m focus of met [Right frontal]; Jan 12 2016- 3 new brain mets [start WBRT 8/31]  # left breast abscess; Aug 2017- PET ? Right Breast abcess  # "Chronic pancreatitis"/ smoker [15ppt]   # MOLECULAR STUDIES: ordered- 08/30     [place]   07/[place]/2015 Initial Diagnosis    Malignant neoplasm of right upper lobe of lung ([facility])        HISTORY OF PRESENTING ILLNESS:  Tiffany Erickson 45y.o.  female  history of stage IV lung cancer adenocarcinoma currently Status post consolidation chemotherapy- With a recurrent brain metastases; starting whole brain radiation tomorrow.  Patient complains of difficulty swallowing/pain with swallowing especially the potassium medications. Chronic nausea.  She also complains of significant tingling and numbness of her feet; difficulty walking. No falls.  Otherwise no fevers. Patient Chronic mild shortness of breath chronic fatigue. Denies any fevers or chills.  No cough or hemoptysis.   Patient complains of poor appetite. Continues to chronic abdominal discomfort; Chronic nausea. Positive for fatigue.    ROS: A complete 10 point review of system is done which is negative except mentioned above in history of present illness  MEDICAL HISTORY:  Past Medical History:  Diagnosis Date  . Anxiety   . Broken ribs 05/2015   5 ribs  . [place] pancreatitis [place]   . Concussion 06/01/2015   S/P fall-PT  STATES IN PHONE INTERVIEW THAT SHE IS UNSURE IF SHE HAD A CONCUSSION OR NOT  . Frequent UTI    "sometimes" (06/01/2015)  . GERD (gastroesophageal reflux disease)   . Lung cancer ([facility] 07/13/15   METASTATIC ADENOCARCINOMA OF THE LUNG  . Lung mass    right side noted 06/01/2015  . Multiple fractures of ribs of right side 06/01/2015   "5" S/P fall  LEFT SIDE  . Shortness of breath dyspnea    5 rib fx right side    SURGICAL HISTORY: Past Surgical History:  Procedure Laterality Date  . ENDOBRONCHIAL ULTRASOUND N/A 07/13/2015   Procedure: ENDOBRONCHIAL ULTRASOUND;  Surgeon: PLaverle Hobby MD;  Location: [facility] ORS;  Service: Pulmonary;  Laterality: N/A;  . ESOPHAGOGASTRODUODENOSCOPY    . EYE MUSCLE SURGERY Left ~ 1979  . MYRINGOTOMY WITH TUBE PLACEMENT Bilateral   . PORTACATH PLACEMENT Left 07/27/2015   Procedure: INSERTION PORT-A-CATH;  Surgeon: TNestor Lewandowsky MD;  Location: [facility] ORS;  Service: General;  Laterality: Left;  . TONSILLECTOMY      SOCIAL HISTORY: Social History   Social History  . Marital status: Single    Spouse name: N/A  . Number of children: N/A  . Years of education: N/A   Occupational History  . Not on file.   Social History Main Topics  . Smoking status: Current Every Day Smoker    Packs/day: 0.50    Years: 28.00    Types: Cigarettes  . Smokeless tobacco: Never Used  . Alcohol use 8.4 oz/week    2 Glasses of wine, 12 Cans of beer  per week     Comment: 06/01/2015 "drink probably 4 times/wk; either ;4 beers or 2 glasses of wine when I do drink"  . Drug use: No  . Sexual activity: Not Currently   Other Topics Concern  . Not on file   Social History Narrative  . No narrative on file    FAMILY HISTORY: Family History  Problem Relation Age of Onset  . Colon cancer Maternal Grandfather   . CAD Maternal Grandfather   . Pancreatic cancer Maternal Grandmother     ALLERGIES:  is allergic to sulfa antibiotics.  MEDICATIONS:  Current Outpatient  Prescriptions  Medication Sig Dispense Refill  . ALPRAZolam (XANAX) 1 MG tablet Take 0.5-1 mg by mouth 3 (three) times daily as needed for anxiety.     Marland Kitchen dexamethasone (DECADRON) 4 MG tablet Take 1 tablet (4 mg total) by mouth 2 (two) times daily with a meal. 30 tablet 1  . lidocaine-prilocaine (EMLA) cream Apply 1 application topically as needed. 30 g 6  . metoCLOPramide (REGLAN) 10 MG tablet Take 1 tablet (10 mg total) by mouth every 8 (eight) hours as needed for nausea or vomiting. 10 tablet 0  . ondansetron (ZOFRAN) 8 MG tablet Take 1 tablet (8 mg total) by mouth every 8 (eight) hours as needed for nausea or vomiting (start 3 days; after chemo). 40 tablet 3  . pantoprazole (PROTONIX) 20 MG tablet Take 20 mg by mouth at bedtime.     . pramipexole (MIRAPEX) 0.5 MG tablet TK 1 T PO QHS  2  . prochlorperazine (COMPAZINE) 10 MG tablet Take 1 tablet (10 mg total) by mouth every 6 (six) hours as needed for nausea or vomiting. 40 tablet 1  . promethazine (PHENERGAN) 25 MG tablet Take 25 mg by mouth every 8 (eight) hours as needed for nausea or vomiting.    Marland Kitchen zolpidem (AMBIEN) 5 MG tablet Take 5 mg by mouth at bedtime as needed for sleep.    . fluconazole (DIFLUCAN) 100 MG tablet Take 1 tablet (100 mg total) by mouth daily. 7 tablet 0  . HYDROcodone-acetaminophen (NORCO/VICODIN) 5-325 MG tablet Take 1 tablet by mouth every 8 (eight) hours as needed for moderate pain. 90 tablet 0  . nystatin (MYCOSTATIN) 100000 UNIT/ML suspension Take 5 mLs (500,000 Units total) by mouth 4 (four) times daily. 60 mL 0  . pregabalin (LYRICA) 75 MG capsule Take 1 capsule (75 mg total) by mouth 2 (two) times daily. 60 capsule 3   No current facility-administered medications for this visit.       Marland Kitchen  PHYSICAL EXAMINATION: ECOG PERFORMANCE STATUS: 0 - Asymptomatic  Vitals:   02/03/16 1145  BP: 117/79  Pulse: 80  Temp: 97 F (36.1 C)   Filed Weights   02/03/16 1145  Weight: 131 lb 4 oz (59.5 kg)    GENERAL:  Well-nourished well-developed; Alert, no distress and comfortable.She is Accompanied by her aunt/mother. 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 mediport- no signs of infection.   LABORATORY DATA:  I have reviewed the data as listed Lab Results  Component Value Date   WBC 15.8 (H) 02/03/2016   HGB 15.7 02/03/2016   HCT 45.8 02/03/2016   MCV 102.7 (H) 02/03/2016   PLT 173 02/03/2016    Recent Labs  10/09/15 0845 11/04/15 0830  11/25/15 0911 12/09/15 1414 01/13/16 1352 02/03/16 1118  NA 140 138  < > 138 137 135 129*  K 3.2* 3.0*  < > 3.1* 3.5 2.8* 3.2*  CL 105 102  < > 103 105 99* 95*  CO2 25 25  < > _0 GLUCOSE 100* 101*  < > 108* 117* 106* 127*  BUN <5* <5*  < > <5* 5* <5* 13  CREATININE 0.62 0.53  < > 0.58 0.48 0.54 0.48  CALCIUM 8.6* 8.4*  < > 8.7* 9.0 8.3* 8.5*  GFRNONAA >60 >60  < > >60 >60 >60 >60  GFRAA >60 >60  < > >60 >60 >60 >60  PROT 6.5 6.6  --  6.6  --   --   --   ALBUMIN 3.7 3.7  --  3.5  --   --   --   AST 32 54*  --  25  --   --   --   ALT 25 36  --  15  --   --   --   ALKPHOS 79 91  --  77  --   --   --   BILITOT 0.5 0.7  --  0.5  --   --   --   < > = values in this interval not displayed.   ASSESSMENT & PLAN:   Malignant neoplasm of right upper lobe of lung ([place])  # STAGE IV [tiny focus of brain met] Adenocarcinoma T2N2; right upper lobe; s/p  cycle #2 consolidation taxol alone [carbo held sec to  breast abcess]. No clinical signs of progression. AUg 1st 2017-PET- partial response.   # Brain metastasis- MRI of the brain- three new mets. Start RT- 8/31   # Hypokalemia-potassium 3.2/ declines k supp sec to pain/ recom nutrition supp.   # Chronic abdominal pain chronic mild nausea- secondary to chronic pancreatitis not any worse.  # Left breast abcess s/p Drainage; Right breast abcess- improved.   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 lyrica & hydrocodone   # Dysphagia/ Thrush- diflucan 100 mg/day x7 days; Nystatin.   # follow up in 4 weeks/ labs/port flush.     Cammie Sickle, MD 02/03/2016 1:28 PM [facility] CONSULT NOTE  Patient Care Team: Elgie Collard, MD as PCP - General (Obstetrics and Gynecology) Christene Lye, MD (General Surgery)  CHIEF COMPLAINTS/PURPOSE OF CONSULTATION:   Oncology History   # FEB 2017- ADENOCARCINOMA RUL [T2N2; STAGE III Bronch/FNA- Right paratracheal LN;Dr.Ram]. PET- no distant mets; March 3rd START CARBO-TAXOL Weekly +RT [until may 5th?]; June 21st 2017- last dose of consolidation carbo-taxol; Jan 12 2016- PET- Improved on RUL mass/LN  # FEB 2017- MRI Brain 1m focus of met [Right frontal]; Jan 12 2016- 3 new brain mets [start WBRT 8/31]  # left breast abscess; Aug 2017- PET ? Right Breast abcess  # "Chronic pancreatitis"/ smoker [15ppt]   # MOLECULAR STUDIES: ordered- 08/30     Malignant ne[place] of right [place] lobe of lung ([facility]   07/30/2015 Initial Diagnosis    Malignant neoplasm of right upper lobe of lung ([facility])        HISTORY OF PRESENTING ILLNESS:  Tiffany  J Erickson 45 y.o.  female  history of stage IV lung cancer adenocarcinoma currently Status post consolidation chemotherapy is here to review the results of her PET scan and also brain MRI.  Patient complains of intermittent headaches during the daytime. She notes she has swelling of the right breast; she had been on doxycycline which was causing her stomach upset.     Otherwise no fevers. Patient Chronic mild shortness of breath chronic fatigue. Denies any fevers or chills.  No cough or hemoptysis. No swelling in the legs or arms.   Patient complains of poor appetite. Continues to chronic abdominal discomfort; Chronic nausea. Positive for fatigue.   ROS: A complete 10 point review of system is done which is negative except mentioned above in history of present illness  MEDICAL HISTORY:  Past Medical History:  Diagnosis Date  . Anxiety   . Broken ribs 05/2015   5 ribs  . Chronic pancreatitis ([place])   . Concussion 06/01/2015   S/P fall-PT STATES IN PHONE INTERVIEW THAT SHE IS UNSURE IF SHE HAD A CONCUSSION OR NOT  . Frequent UTI    "sometimes" (06/01/2015)  . GERD (gastroesophageal reflux disease)   . Lung cancer ([place]) 07/13/15   METASTATIC ADENOCARCINOMA OF THE LUNG  . Lung mass    right side noted 06/01/2015  . Multiple fractures of ribs of right side 06/01/2015   "5" S/P fall  LEFT SIDE  . Shortness of breath dyspnea    5 rib fx right side    SURGICAL HISTORY: Past Surgical History:  Procedure Laterality Date  . ENDOBRONCHIAL ULTRASOUND N/A 07/13/2015   Procedure: ENDOBRONCHIAL ULTRASOUND;  Surgeon: Laverle Hobby, MD;  Location: [facility] ORS;  Service: Pulmonary;  Laterality: N/A;  . ESOPHAGOGASTRODUODENOSCOPY    . EYE MUSCLE SURGERY Left ~ 1979  . MYRINGOTOMY WITH TUBE PLACEMENT Bilateral   . PORTACATH PLACEMENT Left 07/27/2015   Procedure: INSERTION PORT-A-CATH;  Surgeon: [name], MD;  Location: [facility] ORS;  Service: General;  Laterality: Left;  . TONSILLECTOMY      SOCIAL HISTORY: Social History   Social History  . Marital status: Single    Spouse name: N/A  . Number of children: N/A  . Years of education: N/A   Occupational History  . Not on file.   Social History Main Topics  . Smoking status: Current Every Day Smoker    Packs/day: 0.50    Years: 28.00    Types: Cigarettes  . Smokeless tobacco: Never Used  . Alcohol  use 8.4 oz/week    2 Glasses of wine, 12 Cans of beer per week     Comment: 06/01/2015 "drink probably 4 times/wk; either ;4 beers or 2 glasses of wine when I do drink"  . Drug use: No  . Sexual activity: Not Currently   Other Topics Concern  . Not on file   Social History Narrative  . No narrative on file    FAMILY HISTORY: Family History  Problem Relation Age of Onset  . Colon cancer Maternal Grandfather   . CAD Maternal Grandfather   . Pancreatic cancer Maternal Grandmother     ALLERGIES:  is allergic to sulfa antibiotics.  MEDICATIONS:  Current Outpatient Prescriptions  Medication Sig Dispense Refill  . ALPRAZolam (XANAX) 1 MG tablet Take 0.5-1 mg by mouth 3 (three) times daily as needed for anxiety.     Marland Kitchen dexamethasone (DECADRON) 4 MG tablet Take 1 tablet (4 mg total) by mouth 2 (two) times daily with a meal. 30  tablet 1  . lidocaine-prilocaine (EMLA) cream Apply 1 application topically as needed. 30 g 6  . metoCLOPramide (REGLAN) 10 MG tablet Take 1 tablet (10 mg total) by mouth every 8 (eight) hours as needed for nausea or vomiting. 10 tablet 0  . ondansetron (ZOFRAN) 8 MG tablet Take 1 tablet (8 mg total) by mouth every 8 (eight) hours as needed for nausea or vomiting (start 3 days; after chemo). 40 tablet 3  . pantoprazole (PROTONIX) 20 MG tablet Take 20 mg by mouth at bedtime.     . pramipexole (MIRAPEX) 0.5 MG tablet TK 1 T PO QHS  2  . prochlorperazine (COMPAZINE) 10 MG tablet Take 1 tablet (10 mg total) by mouth every 6 (six) hours as needed for nausea or vomiting. 40 tablet 1  . promethazine (PHENERGAN) 25 MG tablet Take 25 mg by mouth every 8 (eight) hours as needed for nausea or vomiting.    Marland Kitchen zolpidem (AMBIEN) 5 MG tablet Take 5 mg by mouth at bedtime as needed for sleep.    . fluconazole (DIFLUCAN) 100 MG tablet Take 1 tablet (100 mg total) by mouth daily. 7 tablet 0  . HYDROcodone-acetaminophen (NORCO/VICODIN) 5-325 MG tablet Take 1 tablet by mouth every 8  (eight) hours as needed for moderate pain. 90 tablet 0  . nystatin (MYCOSTATIN) 100000 UNIT/ML suspension Take 5 mLs (500,000 Units total) by mouth 4 (four) times daily. 60 mL 0  . pregabalin (LYRICA) 75 MG capsule Take 1 capsule (75 mg total) by mouth 2 (two) times daily. 60 capsule 3   No current facility-administered medications for this visit.       Marland Kitchen  PHYSICAL EXAMINATION: ECOG PERFORMANCE STATUS: 0 - Asymptomatic  Vitals:   02/03/16 1145  BP: 117/79  Pulse: 80  Temp: 97 F (36.1 C)   Filed Weights   02/03/16 1145  Weight: 131 lb 4 oz (59.5 kg)    GENERAL: Well-nourished well-developed; Alert, no distress and comfortable.She is Accompanied by her aunt/mother. 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 mediport- no signs of infection.   LABORATORY DATA:  I have reviewed the data as listed Lab Results  Component Value Date   WBC 15.8 (H) 02/03/2016   HGB 15.7 02/03/2016   HCT 45.8 02/03/2016   MCV 102.7 (H) 02/03/2016   PLT 173 02/03/2016    Recent Labs  10/09/15 0845 11/04/15 0830  11/25/15 0911 12/09/15 1414 01/13/16 1352 02/03/16 1118  NA 140 138  < > 138 137 135 129*  K 3.2* 3.0*  < > 3.1* 3.5 2.8* 3.2*  CL 105 102  < > 103 105 99* 95*  CO2 25 25  < > _0 GLUCOSE 100* 101*  < > 108* 117* 106* 127*  BUN <5* <5*  < > <5* 5* <5* 13  CREATININE 0.62 0.53  < > 0.58 0.48 0.54 0.48  CALCIUM 8.6* 8.4*  < > 8.7* 9.0  8.3* 8.5*  GFRNONAA >60 >60  < > >60 >60 >  60 >60  GFRAA >60 >60  < > >60 >60 >60 >60  PROT 6.5 6.6  --  6.6  --   --   --   ALBUMIN 3.7 3.7  --  3.5  --   --   --   AST 32 54*  --  25  --   --   --   ALT 25 36  --  15  --   --   --   ALKPHOS 79 91  --  77  --   --   --   BILITOT 0.5 0.7  --  0.5  --   --   --   < > = values in this interval not displayed.   ASSESSMENT & PLAN:   Malignant neoplasm of right upper lobe of lung ([place])  # STAGE IV [tiny focus of brain met] Adenocarcinoma T2N2; right upper lobe; s/p  cycle #2 consolidation taxol alone [carbo held sec to breast abcess]. No clinical signs of progression. AUg 1st 2017-PET- partial response.   # Brain metastasis- MRI of the brain- three new mets. Start RT- 8/31   # Hypokalemia-potassium 3.2/ declines k supp sec to pain/ recom nutrition supp.   # Chronic abdominal pain chronic mild nausea- secondary to chronic pancreatitis not any worse.  # Left breast abcess s/p Drainage; Right breast abcess- improved.   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 lyrica & hydrocodone   # Dysphagia/ Thrush- diflucan 100 mg/day x7 days; Nystatin.   # follow up in 4 weeks/ labs/port flush.     Cammie Sickle, MD 02/03/2016 1:28 PM

## 2016-02-04 ENCOUNTER — Ambulatory Visit
Admission: RE | Admit: 2016-02-04 | Discharge: 2016-02-04 | Disposition: A | Discharge status: Home / Self Care | Payer: BLUE CROSS/BLUE SHIELD | Source: Ambulatory Visit | Attending: Radiation Oncology | Admitting: Radiation Oncology

## 2016-02-04 DIAGNOSIS — C7931 Secondary malignant neoplasm of brain: Secondary | ICD-10-CM | POA: Diagnosis not present

## 2016-02-05 ENCOUNTER — Ambulatory Visit
Admission: RE | Admit: 2016-02-05 | Discharge: 2016-02-05 | Disposition: A | Discharge status: Home / Self Care | Payer: BLUE CROSS/BLUE SHIELD | Source: Ambulatory Visit | Attending: Radiation Oncology | Admitting: Radiation Oncology

## 2016-02-05 DIAGNOSIS — C7931 Secondary malignant neoplasm of brain: Secondary | ICD-10-CM | POA: Diagnosis not present

## 2016-02-09 ENCOUNTER — Ambulatory Visit
Admission: RE | Admit: 2016-02-09 | Discharge: 2016-02-09 | Disposition: A | Discharge status: Home / Self Care | Payer: BLUE CROSS/BLUE SHIELD | Source: Ambulatory Visit | Attending: Radiation Oncology | Admitting: Radiation Oncology

## 2016-02-09 DIAGNOSIS — C7931 Secondary malignant neoplasm of brain: Secondary | ICD-10-CM | POA: Diagnosis not present

## 2016-02-10 ENCOUNTER — Other Ambulatory Visit: Payer: Self-pay | Admitting: Internal Medicine

## 2016-02-10 ENCOUNTER — Inpatient Hospital Stay: Payer: BLUE CROSS/BLUE SHIELD | Attending: Internal Medicine

## 2016-02-10 ENCOUNTER — Other Ambulatory Visit: Payer: Self-pay

## 2016-02-10 ENCOUNTER — Ambulatory Visit
Admission: RE | Admit: 2016-02-10 | Discharge: 2016-02-10 | Disposition: A | Discharge status: Home / Self Care | Payer: BLUE CROSS/BLUE SHIELD | Source: Ambulatory Visit | Attending: Radiation Oncology | Admitting: Radiation Oncology

## 2016-02-10 DIAGNOSIS — Z79899 Other long term (current) drug therapy: Secondary | ICD-10-CM | POA: Insufficient documentation

## 2016-02-10 DIAGNOSIS — C771 Secondary and unspecified malignant neoplasm of intrathoracic lymph nodes: Secondary | ICD-10-CM | POA: Insufficient documentation

## 2016-02-10 DIAGNOSIS — F17210 Nicotine dependence, cigarettes, uncomplicated: Secondary | ICD-10-CM | POA: Insufficient documentation

## 2016-02-10 DIAGNOSIS — B370 Candidal stomatitis: Secondary | ICD-10-CM | POA: Diagnosis not present

## 2016-02-10 DIAGNOSIS — Z923 Personal history of irradiation: Secondary | ICD-10-CM | POA: Diagnosis not present

## 2016-02-10 DIAGNOSIS — R29898 Other symptoms and signs involving the musculoskeletal system: Secondary | ICD-10-CM | POA: Insufficient documentation

## 2016-02-10 DIAGNOSIS — C7931 Secondary malignant neoplasm of brain: Secondary | ICD-10-CM | POA: Diagnosis not present

## 2016-02-10 DIAGNOSIS — T380X5A Adverse effect of glucocorticoids and synthetic analogues, initial encounter: Secondary | ICD-10-CM | POA: Diagnosis not present

## 2016-02-10 DIAGNOSIS — J069 Acute upper respiratory infection, unspecified: Secondary | ICD-10-CM | POA: Diagnosis not present

## 2016-02-10 DIAGNOSIS — Z9221 Personal history of antineoplastic chemotherapy: Secondary | ICD-10-CM | POA: Diagnosis not present

## 2016-02-10 DIAGNOSIS — R5382 Chronic fatigue, unspecified: Secondary | ICD-10-CM | POA: Diagnosis not present

## 2016-02-10 DIAGNOSIS — R110 Nausea: Secondary | ICD-10-CM | POA: Diagnosis not present

## 2016-02-10 DIAGNOSIS — R0602 Shortness of breath: Secondary | ICD-10-CM | POA: Insufficient documentation

## 2016-02-10 DIAGNOSIS — H6693 Otitis media, unspecified, bilateral: Secondary | ICD-10-CM | POA: Diagnosis not present

## 2016-02-10 DIAGNOSIS — R200 Anesthesia of skin: Secondary | ICD-10-CM | POA: Insufficient documentation

## 2016-02-10 DIAGNOSIS — L598 Other specified disorders of the skin and subcutaneous tissue related to radiation: Secondary | ICD-10-CM | POA: Insufficient documentation

## 2016-02-10 DIAGNOSIS — F419 Anxiety disorder, unspecified: Secondary | ICD-10-CM | POA: Insufficient documentation

## 2016-02-10 DIAGNOSIS — R51 Headache: Secondary | ICD-10-CM | POA: Diagnosis not present

## 2016-02-10 DIAGNOSIS — N63 Unspecified lump in breast: Secondary | ICD-10-CM | POA: Insufficient documentation

## 2016-02-10 DIAGNOSIS — Z800 Family history of malignant neoplasm of digestive organs: Secondary | ICD-10-CM | POA: Insufficient documentation

## 2016-02-10 DIAGNOSIS — K30 Functional dyspepsia: Secondary | ICD-10-CM | POA: Insufficient documentation

## 2016-02-10 DIAGNOSIS — C3411 Malignant neoplasm of upper lobe, right bronchus or lung: Secondary | ICD-10-CM | POA: Insufficient documentation

## 2016-02-10 DIAGNOSIS — R531 Weakness: Secondary | ICD-10-CM | POA: Diagnosis not present

## 2016-02-10 DIAGNOSIS — K861 Other chronic pancreatitis: Secondary | ICD-10-CM | POA: Insufficient documentation

## 2016-02-10 DIAGNOSIS — K219 Gastro-esophageal reflux disease without esophagitis: Secondary | ICD-10-CM | POA: Diagnosis not present

## 2016-02-10 DIAGNOSIS — R630 Anorexia: Secondary | ICD-10-CM | POA: Insufficient documentation

## 2016-02-10 DIAGNOSIS — R1310 Dysphagia, unspecified: Secondary | ICD-10-CM | POA: Insufficient documentation

## 2016-02-10 LAB — CBC
HCT: 41.4 % (ref 35.0–47.0)
Hemoglobin: 14.1 g/dL (ref 12.0–16.0)
MCH: 34.3 pg — AB (ref 26.0–34.0)
MCHC: 34.1 g/dL (ref 32.0–36.0)
MCV: 100.8 fL — AB (ref 80.0–100.0)
PLATELETS: 189 10*3/uL (ref 150–440)
RBC: 4.11 MIL/uL (ref 3.80–5.20)
RDW: 14 % (ref 11.5–14.5)
WBC: 12.7 10*3/uL — AB (ref 3.6–11.0)

## 2016-02-11 ENCOUNTER — Ambulatory Visit
Admission: RE | Admit: 2016-02-11 | Discharge: 2016-02-11 | Disposition: A | Discharge status: Home / Self Care | Payer: BLUE CROSS/BLUE SHIELD | Source: Ambulatory Visit | Attending: Radiation Oncology | Admitting: Radiation Oncology

## 2016-02-11 DIAGNOSIS — C7931 Secondary malignant neoplasm of brain: Secondary | ICD-10-CM | POA: Diagnosis not present

## 2016-02-12 ENCOUNTER — Ambulatory Visit
Admission: RE | Admit: 2016-02-12 | Discharge: 2016-02-12 | Disposition: A | Discharge status: Home / Self Care | Payer: BLUE CROSS/BLUE SHIELD | Source: Ambulatory Visit | Attending: Radiation Oncology | Admitting: Radiation Oncology

## 2016-02-12 DIAGNOSIS — C7931 Secondary malignant neoplasm of brain: Secondary | ICD-10-CM | POA: Diagnosis not present

## 2016-02-15 ENCOUNTER — Ambulatory Visit
Admission: RE | Admit: 2016-02-15 | Discharge: 2016-02-15 | Disposition: A | Discharge status: Home / Self Care | Payer: BLUE CROSS/BLUE SHIELD | Source: Ambulatory Visit | Attending: Radiation Oncology | Admitting: Radiation Oncology

## 2016-02-15 DIAGNOSIS — C7931 Secondary malignant neoplasm of brain: Secondary | ICD-10-CM | POA: Diagnosis not present

## 2016-02-16 ENCOUNTER — Ambulatory Visit
Admission: RE | Admit: 2016-02-16 | Discharge: 2016-02-16 | Disposition: A | Discharge status: Home / Self Care | Payer: BLUE CROSS/BLUE SHIELD | Source: Ambulatory Visit | Attending: Radiation Oncology | Admitting: Radiation Oncology

## 2016-02-16 ENCOUNTER — Other Ambulatory Visit: Payer: Self-pay | Admitting: *Deleted

## 2016-02-16 DIAGNOSIS — C3411 Malignant neoplasm of upper lobe, right bronchus or lung: Secondary | ICD-10-CM

## 2016-02-16 DIAGNOSIS — C7931 Secondary malignant neoplasm of brain: Secondary | ICD-10-CM | POA: Diagnosis not present

## 2016-02-16 MED ORDER — FENTANYL 50 MCG/HR TD PT72
50.0000 ug | MEDICATED_PATCH | TRANSDERMAL | 0 refills | Status: DC
Start: 2016-02-16 — End: 2016-02-29

## 2016-02-17 ENCOUNTER — Inpatient Hospital Stay: Payer: BLUE CROSS/BLUE SHIELD

## 2016-02-17 ENCOUNTER — Ambulatory Visit
Admission: RE | Admit: 2016-02-17 | Discharge: 2016-02-17 | Disposition: A | Discharge status: Home / Self Care | Payer: BLUE CROSS/BLUE SHIELD | Source: Ambulatory Visit | Attending: Radiation Oncology | Admitting: Radiation Oncology

## 2016-02-17 DIAGNOSIS — C7931 Secondary malignant neoplasm of brain: Secondary | ICD-10-CM | POA: Diagnosis not present

## 2016-02-17 DIAGNOSIS — C3411 Malignant neoplasm of upper lobe, right bronchus or lung: Secondary | ICD-10-CM | POA: Diagnosis not present

## 2016-02-17 LAB — CBC
HCT: 40.5 % (ref 35.0–47.0)
HEMOGLOBIN: 13.9 g/dL (ref 12.0–16.0)
MCH: 34.4 pg — ABNORMAL HIGH (ref 26.0–34.0)
MCHC: 34.3 g/dL (ref 32.0–36.0)
MCV: 100.3 fL — ABNORMAL HIGH (ref 80.0–100.0)
Platelets: 183 10*3/uL (ref 150–440)
RBC: 4.03 MIL/uL (ref 3.80–5.20)
RDW: 14.4 % (ref 11.5–14.5)
WBC: 9.3 10*3/uL (ref 3.6–11.0)

## 2016-02-17 LAB — POTASSIUM: POTASSIUM: 4 mmol/L (ref 3.5–5.1)

## 2016-02-18 ENCOUNTER — Ambulatory Visit
Admission: RE | Admit: 2016-02-18 | Discharge: 2016-02-18 | Disposition: A | Discharge status: Home / Self Care | Payer: BLUE CROSS/BLUE SHIELD | Source: Ambulatory Visit | Attending: Radiation Oncology | Admitting: Radiation Oncology

## 2016-02-18 DIAGNOSIS — C7931 Secondary malignant neoplasm of brain: Secondary | ICD-10-CM | POA: Diagnosis not present

## 2016-02-19 ENCOUNTER — Ambulatory Visit
Admission: RE | Admit: 2016-02-19 | Discharge: 2016-02-19 | Disposition: A | Discharge status: Home / Self Care | Payer: BLUE CROSS/BLUE SHIELD | Source: Ambulatory Visit | Attending: Radiation Oncology | Admitting: Radiation Oncology

## 2016-02-19 ENCOUNTER — Encounter: Payer: Self-pay | Admitting: Internal Medicine

## 2016-02-19 DIAGNOSIS — C7931 Secondary malignant neoplasm of brain: Secondary | ICD-10-CM | POA: Diagnosis not present

## 2016-02-22 ENCOUNTER — Ambulatory Visit: Payer: BLUE CROSS/BLUE SHIELD | Admitting: Internal Medicine

## 2016-02-22 ENCOUNTER — Inpatient Hospital Stay (HOSPITAL_BASED_OUTPATIENT_CLINIC_OR_DEPARTMENT_OTHER): Payer: BLUE CROSS/BLUE SHIELD | Admitting: Internal Medicine

## 2016-02-22 ENCOUNTER — Ambulatory Visit
Admission: RE | Admit: 2016-02-22 | Discharge: 2016-02-22 | Disposition: A | Discharge status: Home / Self Care | Payer: BLUE CROSS/BLUE SHIELD | Source: Ambulatory Visit | Attending: Radiation Oncology | Admitting: Radiation Oncology

## 2016-02-22 VITALS — BP 128/88 | HR 118 | Temp 98.0°F | Wt 141.0 lb

## 2016-02-22 DIAGNOSIS — F419 Anxiety disorder, unspecified: Secondary | ICD-10-CM

## 2016-02-22 DIAGNOSIS — G729 Myopathy, unspecified: Secondary | ICD-10-CM

## 2016-02-22 DIAGNOSIS — R110 Nausea: Secondary | ICD-10-CM

## 2016-02-22 DIAGNOSIS — F17210 Nicotine dependence, cigarettes, uncomplicated: Secondary | ICD-10-CM

## 2016-02-22 DIAGNOSIS — Z9221 Personal history of antineoplastic chemotherapy: Secondary | ICD-10-CM

## 2016-02-22 DIAGNOSIS — R29898 Other symptoms and signs involving the musculoskeletal system: Secondary | ICD-10-CM

## 2016-02-22 DIAGNOSIS — R5382 Chronic fatigue, unspecified: Secondary | ICD-10-CM

## 2016-02-22 DIAGNOSIS — R51 Headache: Secondary | ICD-10-CM

## 2016-02-22 DIAGNOSIS — K30 Functional dyspepsia: Secondary | ICD-10-CM

## 2016-02-22 DIAGNOSIS — K861 Other chronic pancreatitis: Secondary | ICD-10-CM

## 2016-02-22 DIAGNOSIS — Z923 Personal history of irradiation: Secondary | ICD-10-CM

## 2016-02-22 DIAGNOSIS — B370 Candidal stomatitis: Secondary | ICD-10-CM

## 2016-02-22 DIAGNOSIS — R0602 Shortness of breath: Secondary | ICD-10-CM

## 2016-02-22 DIAGNOSIS — C3411 Malignant neoplasm of upper lobe, right bronchus or lung: Secondary | ICD-10-CM

## 2016-02-22 DIAGNOSIS — K219 Gastro-esophageal reflux disease without esophagitis: Secondary | ICD-10-CM

## 2016-02-22 DIAGNOSIS — C771 Secondary and unspecified malignant neoplasm of intrathoracic lymph nodes: Secondary | ICD-10-CM

## 2016-02-22 DIAGNOSIS — N63 Unspecified lump in breast: Secondary | ICD-10-CM

## 2016-02-22 DIAGNOSIS — Z79899 Other long term (current) drug therapy: Secondary | ICD-10-CM

## 2016-02-22 DIAGNOSIS — C7931 Secondary malignant neoplasm of brain: Secondary | ICD-10-CM | POA: Diagnosis not present

## 2016-02-22 DIAGNOSIS — L598 Other specified disorders of the skin and subcutaneous tissue related to radiation: Secondary | ICD-10-CM

## 2016-02-22 DIAGNOSIS — Z800 Family history of malignant neoplasm of digestive organs: Secondary | ICD-10-CM

## 2016-02-22 DIAGNOSIS — R630 Anorexia: Secondary | ICD-10-CM

## 2016-02-22 NOTE — Assessment & Plan Note (Signed)
#  STAGE IV [place] focus of brain met] Adenocarcinoma T2N2; right upper lobe; s/p  cycle #2 consolidation taxol alone [carbo held sec to breast abcess]. No clinical signs of progression. AUg 1st 2017-PET- partial response.   # Brain metastasis- MRI of the brain- three new mets. Start RT- 8/31; will finish around 9/25.   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   # Oral thrush- improved.    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 lyrica & hydrocodone; fentanyl patch.   # follow up as planned on 9/27-MD/ labs/port flush.

## 2016-02-22 NOTE — Progress Notes (Signed)
Patient ambulates without assistance brought to exam room 15.  Patient denies pain or discomfort at this time BP 128/88 HR 118 O2 94% on room air, vitals documented

## 2016-02-22 NOTE — Progress Notes (Signed)
[facility] CONSULT NOTE  Patient Care Team: Elgie Collard, MD as PCP - General (Obstetrics and Gynecology) Christene Lye, MD (General Surgery)  CHIEF COMPLAINTS/PURPOSE OF CONSULTATION:   Oncology History   # FEB 2017- ADENOCARCINOMA RUL [T2N2; STAGE III Bronch/FNA- Right paratracheal LN;Dr.Ram]. PET- no distant mets; March 3rd START CARBO-TAXOL Weekly +RT [until may 5th?]; June 21st 2017- last dose of consolidation carbo-taxol; Jan 12 2016- PET- Improved on RUL mass/LN  # FEB 2017- MRI Brain 24m focus of met [Right frontal]; Jan 12 2016- 3 new brain mets [start WBRT 8/31]  # left breast abscess; Aug 2017- PET ? Right Breast abcess  # "Chronic pancreatitis"/ smoker [15ppt]   # MOLECULAR STUDIES: ordered- 08/30     Malignant neoplasm of right upper lobe of lung ([facility]   07/30/2015 Initial Diagnosis    Malignant neoplasm of right upper lobe of lung ([facility])        HISTORY OF PRESENTING ILLNESS:  Tiffany Erickson 45y.o.  female  history of stage IV lung cancer adenocarcinoma currently Status post consolidation chemotherapy- With a recurrent brain metastases- Currently getting whole brain radiation.  Approximately week to 10 days ago patient noted to have significant weakness in her bilateral thighs and hips; noted to have also burning pain going up her feet. She has been started on fentanyl patch with radiation oncology. Pain is significant improved.  Pain with swallowing is also significantly improved.  No falls.  Otherwise no fevers. Patient Chronic mild shortness of breath chronic fatigue. Denies any fevers or chills.  No cough or hemoptysis.   Patient complains of poor appetite. Continues to chronic abdominal discomfort; Chronic nausea. Positive for fatigue.    ROS: A complete 10 point review of system is done which is negative except mentioned above in history of present illness  MEDICAL HISTORY:  Past Medical History:  Diagnosis Date  . Anxiety   .  Broken ribs 05/2015   5 ribs  . Chronic pancreatitis ([facility]   . Concussion 06/01/2015   S/P fall-PT STATES IN PHONE INTERVIEW THAT SHE IS UNSURE IF SHE HAD A CONCUSSION OR NOT  . Frequent UTI    "sometimes" (06/01/2015)  . GERD (gastroesophageal reflux disease)   . Lung cancer ([facility] 07/13/15   METASTATIC ADENOCARCINOMA OF THE LUNG  . Lung mass    right side noted 06/01/2015  . Multiple fractures of ribs of right side 06/01/2015   "5" S/P fall  LEFT SIDE  . Shortness of breath dyspnea    5 rib fx right side    SURGICAL HISTORY: Past Surgical History:  Procedure Laterality Date  . ENDOBRONCHIAL ULTRASOUND N/A 07/13/2015   Procedure: ENDOBRONCHIAL ULTRASOUND;  Surgeon: PLaverle Hobby MD;  Location: [facility] ORS;  Service: Pulmonary;  Laterality: N/A;  . ESOPHAGOGASTRODUODENOSCOPY    . EYE MUSCLE SURGERY Left ~ 1979  . MYRINGOTOMY WITH TUBE PLACEMENT Bilateral   . PORTACATH PLACEMENT Left 07/27/2015   Procedure: INSERTION PORT-A-CATH;  Surgeon: TNestor Lewandowsky MD;  Location: [facility] ORS;  Service: General;  Laterality: Left;  . TONSILLECTOMY      SOCIAL HISTORY: Social History   Social History  . Marital status: Single    Spouse name: N/A  . Number of children: N/A  . Years of education: N/A   Occupational History  . Not on file.   Social History Main Topics  . Smoking status: Current Every Day Smoker    Packs/day: 0.50    Years: 28.00    Types:  Cigarettes  . Smokeless tobacco: Never Used  . Alcohol use 8.4 oz/week    2 Glasses of wine, 12 Cans of beer per week     Comment: 06/01/2015 "drink probably 4 times/wk; either ;4 beers or 2 glasses of wine when I do drink"  . Drug use: No  . Sexual activity: Not Currently   Other Topics Concern  . Not on file   Social History Narrative  . No narrative on file    FAMILY HISTORY: Family History  Problem Relation Age of Onset  . Colon cancer Maternal Grandfather   . CAD Maternal Grandfather   . Pancreatic cancer Maternal  Grandmother     ALLERGIES:  is allergic to sulfa antibiotics.  MEDICATIONS:  Current Outpatient Prescriptions  Medication Sig Dispense Refill  . ALPRAZolam (XANAX) 1 MG tablet Take 0.5-1 mg by mouth 3 (three) times daily as needed for anxiety.     Marland Kitchen dexamethasone (DECADRON) 4 MG tablet Take 1 tablet (4 mg total) by mouth 2 (two) times daily with a meal. 30 tablet 1  . fentaNYL (DURAGESIC - DOSED MCG/HR) 50 MCG/HR Place 1 patch (50 mcg total) onto the skin every 3 (three) days. 5 patch 0  . fluconazole (DIFLUCAN) 100 MG tablet Take 1 tablet (100 mg total) by mouth daily. 7 tablet 0  . HYDROcodone-acetaminophen (NORCO/VICODIN) 5-325 MG tablet Take 1 tablet by mouth every 8 (eight) hours as needed for moderate pain. 90 tablet 0  . lidocaine-prilocaine (EMLA) cream Apply 1 application topically as needed. 30 g 6  . metoCLOPramide (REGLAN) 10 MG tablet Take 1 tablet (10 mg total) by mouth every 8 (eight) hours as needed for nausea or vomiting. 10 tablet 0  . nystatin (MYCOSTATIN) 100000 UNIT/ML suspension Take 5 mLs (500,000 Units total) by mouth 4 (four) times daily. 60 mL 0  . ondansetron (ZOFRAN) 8 MG tablet Take 1 tablet (8 mg total) by mouth every 8 (eight) hours as needed for nausea or vomiting (start 3 days; after chemo). 40 tablet 3  . pantoprazole (PROTONIX) 20 MG tablet Take 20 mg by mouth at bedtime.     . pramipexole (MIRAPEX) 0.5 MG tablet TK 1 T PO QHS  2  . pregabalin (LYRICA) 75 MG capsule Take 1 capsule (75 mg total) by mouth 2 (two) times daily. 60 capsule 3  . prochlorperazine (COMPAZINE) 10 MG tablet Take 1 tablet (10 mg total) by mouth every 6 (six) hours as needed for nausea or vomiting. 40 tablet 1  . promethazine (PHENERGAN) 25 MG tablet Take 25 mg by mouth every 8 (eight) hours as needed for nausea or vomiting.    Marland Kitchen zolpidem (AMBIEN) 5 MG tablet Take 5 mg by mouth at bedtime as needed for sleep.     No current facility-administered medications for this visit.        Marland Kitchen  PHYSICAL EXAMINATION: ECOG PERFORMANCE STATUS: 0 - Asymptomatic  Vitals:   02/22/16 0904  BP: 128/88  Pulse: (!) 118  Temp: 98 F (36.7 C)   Filed Weights   02/22/16 0904  Weight: 141 lb (64 kg)    GENERAL: Well-nourished well-developed; Alert, no distress and comfortable.She is Accompanied by her aunt/mother. 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 mediport- no signs of infection.   LABORATORY DATA:  I have reviewed the data as listed Lab Results  Component Value Date   WBC 9.3 02/17/2016   HGB 13.9 02/17/2016   HCT 40.5 02/17/2016   MCV 100.3 (H) 02/17/2016   PLT 183 02/17/2016    Recent Labs  10/09/15 0845 11/04/15 0830  11/25/15 0911 12/09/15 1414 01/13/16 1352 02/03/16 1118 02/17/16 1408  NA 140 138  < > 138 137 135 129*  --   K 3.2* 3.0*  < > 3.1* 3.5 2.8* 3.2* 4.0  CL 105 102  < > 103 105 99* 95*  --   CO2 25 25  < > _0 --   GLUCOSE 100* 101*  < > 108* 117* 106* 127*  --   BUN <5* <5*  < > <5* 5* <5* 13  --   CREATININE 0.62 0.53  < > 0.58 0.48 0.54 0.48  --   CALCIUM 8.6* 8.4*  < > 8.7* 9.0 8.3* 8.5*  --   GFRNONAA >60 >60  < > >60 >60 >60 >60  --   GFRAA >60 >60  < > >60 >60 >60 >60  --   PROT 6.5 6.6  --  6.6  --   --   --   --   ALBUMIN 3.7 3.7  --  3.5  --   --   --   --   AST 32 54*  --  25  --   --   --   --   ALT 25 36  --  15  --   --   --   --   ALKPHOS 79 91  --  77  --   --   --   --    BILITOT 0.5 0.7  --  0.5  --   --   --   --   < > = values in this interval not displayed.   ASSESSMENT & PLAN:   Malignant neoplasm of right upper lobe of lung ([facility] [facility])  # STAGE IV [tiny focus of brain met] Adenocarcinoma T2N2; right upper lobe; s/p  cycle #2 consolidation taxol alone [carbo held sec to breast abcess]. No clinical signs of progression. AUg 1st 2017-PET- partial response.   # Brain metastasis- MRI of the brain- three new mets. Start RT- 8/31; will finish around 9/25.   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   # Oral thrush- improved.    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 lyrica & hydrocodone; fentanyl patch.   # follow up as planned on 9/27-MD/ labs/port flush.     Cammie Sickle, MD 02/22/2016 9:23 AM [place] CONSULT NOTE  Patient Care Team: Elgie Collard, MD as PCP - General (Obstetrics and Gynecology) Christene Lye, MD (General Surgery)  CHIEF COMPLAINTS/PURPOSE OF CONSULTATION:   Oncology History   # FEB 2017- ADENOCARCINOMA RUL [T2N2; STAGE III Bronch/FNA- Right paratracheal LN;Dr.Ram]. PET- no distant mets; March 3rd START CARBO-TAXOL Weekly +RT [until may 5th?]; June 21st 2017- last dose of consolidation carbo-taxol; Jan 12 2016- PET- Improved on RUL mass/LN  # FEB 2017- MRI Brain 16m focus of met [Right frontal]; Jan 12 2016- 3 new brain mets [start WBRT 8/31]  # left breast abscess; Aug 2017- PET ? Right Breast abcess  # "Chronic pancreatitis"/ smoker [15ppt]   # MOLECULAR STUDIES: ordered- 08/30     Malignant neoplasm of right upper lobe of lung ([place])   07/30/2015 Initial Diagnosis    Malignant neoplasm of right upper lobe of lung ([facility])        HISTORY OF PRESENTING ILLNESS:  Tiffany Erickson 45 y.o.  female  history of stage IV lung cancer adenocarcinoma currently Status post consolidation chemotherapy is here to review the results of her PET scan and also brain  MRI.  Patient complains of intermittent headaches during the daytime. She notes she has swelling of the right breast; she had been on doxycycline which was causing her stomach upset.    Otherwise no fevers. Patient Chronic mild shortness of breath chronic fatigue. Denies any fevers or chills.  No cough or hemoptysis. No swelling in the legs or arms.   Patient complains of poor appetite. Continues to chronic abdominal discomfort; Chronic nausea. Positive for fatigue.   ROS: A complete 10 point review of system is done which is negative except mentioned above in history of present illness  MEDICAL HISTORY:  Past Medical History:  Diagnosis Date  . Anxiety   . Broken ribs 05/2015   5 ribs  . Chronic pancreatitis ([place])   . Concussion 06/01/2015   S/P fall-PT STATES IN PHONE INTERVIEW THAT SHE IS UNSURE IF SHE HAD A CONCUSSION OR NOT  . Frequent UTI    "sometimes" (06/01/2015)  . GERD (gastroesophageal reflux disease)   . Lung cancer ([place]) 07/13/15   METASTATIC ADENOCARCINOMA OF THE LUNG  . Lung mass    right side noted 06/01/2015  . Multiple fractures of ribs of right side 06/01/2015   "5" S/P fall  LEFT SIDE  . Shortness of breath dyspnea    5 rib fx right side    SURGICAL HISTORY: Past Surgical History:  Procedure Laterality Date  . ENDOBRONCHIAL ULTRASOUND N/A 07/13/2015   Procedure: ENDOBRONCHIAL ULTRASOUND;  Surgeon: Laverle Hobby, MD;  Location: [facility] ORS;  Service: Pulmonary;  Laterality: N/A;  . ESOPHAGOGASTRODUODENOSCOPY    . EYE MUSCLE SURGERY Left ~ 1979  . MYRINGOTOMY WITH TUBE PLACEMENT Bilateral   . PORTACATH PLACEMENT Left 07/27/2015   Procedure: INSERTION PORT-A-CATH;  Surgeon: [name], MD;  Location: [facility] ORS;  Service: General;  Laterality: Left;  . TONSILLECTOMY      SOCIAL HISTORY: Social History   Social History  . Marital status: Single    Spouse name: N/A  . Number of children: N/A  . Years of education: N/A   Occupational History  . Not  on file.   Social History Main Topics  . Smoking status: Current Every Day Smoker    Packs/day: 0.50    Years: 28.00    Types: Cigarettes  . Smokeless tobacco: Never Used  . Alcohol use 8.4 oz/week    2 Glasses of wine, 12 Cans of beer per week     Comment: 06/01/2015 "drink probably 4 times/wk; either ;4 beers or 2 glasses of wine when I do drink"  . Drug use: No  . Sexual activity: Not Currently   Other Topics Concern  . Not on file   Social History Narrative  . No narrative on file    FAMILY HISTORY: Family History  Problem Relation Age of Onset  . Colon cancer Maternal Grandfather   . CAD  Maternal Grandfather   . Pancreatic cancer Maternal Grandmother     ALLERGIES:  is allergic to sulfa antibiotics.  MEDICATIONS:  Current Outpatient Prescriptions  Medication Sig Dispense Refill  . ALPRAZolam (XANAX) 1 MG tablet Take 0.5-1 mg by mouth 3 (three) times daily as needed for anxiety.     Marland Kitchen dexamethasone (DECADRON) 4 MG tablet Take 1 tablet (4 mg total) by mouth 2 (two) times daily with a meal. 30 tablet 1  . fentaNYL (DURAGESIC - DOSED MCG/HR) 50 MCG/HR Place 1 patch (50 mcg total) onto the skin every 3 (three) days. 5 patch 0  . fluconazole (DIFLUCAN) 100 MG tablet Take 1 tablet (100 mg total) by mouth daily. 7 tablet 0  . HYDROcodone-acetaminophen (NORCO/VICODIN) 5-325 MG tablet Take 1 tablet by mouth every 8 (eight) hours as needed for moderate pain. 90 tablet 0  . lidocaine-prilocaine (EMLA) cream Apply 1 application topically as needed. 30 g 6  . metoCLOPramide (REGLAN) 10 MG tablet Take 1 tablet (10 mg total) by mouth every 8 (eight) hours as needed for nausea or vomiting. 10 tablet 0  . nystatin (MYCOSTATIN) 100000 UNIT/ML suspension Take 5 mLs (500,000 Units total) by mouth 4 (four) times daily. 60 mL 0  . ondansetron (ZOFRAN) 8 MG tablet Take 1 tablet (8 mg total) by mouth every 8 (eight) hours as needed for nausea or vomiting (start 3 days; after chemo). 40 tablet 3   . pantoprazole (PROTONIX) 20 MG tablet Take 20 mg by mouth at bedtime.     . pramipexole (MIRAPEX) 0.5 MG tablet TK 1 T PO QHS  2  . pregabalin (LYRICA) 75 MG capsule Take 1 capsule (75 mg total) by mouth 2 (two) times daily. 60 capsule 3  . prochlorperazine (COMPAZINE) 10 MG tablet Take 1 tablet (10 mg total) by mouth every 6 (six) hours as needed for nausea or vomiting. 40 tablet 1  . promethazine (PHENERGAN) 25 MG tablet Take 25 mg by mouth every 8 (eight) hours as needed for nausea or vomiting.    Marland Kitchen zolpidem (AMBIEN) 5 MG tablet Take 5 mg by mouth at bedtime as needed for sleep.     No current facility-administered medications for this visit.       Marland Kitchen  PHYSICAL EXAMINATION: ECOG PERFORMANCE STATUS: 0 - Asymptomatic  Vitals:   02/22/16 0904  BP: 128/88  Pulse: (!) 118  Temp: 98 F (36.7 C)   Filed Weights   02/22/16 0904  Weight: 141 lb (64 kg)    GENERAL: Well-nourished well-developed; Alert, no distress and comfortable.She is Accompanied by her aunt/mother. Moon facies.  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 mediport- no signs of infection.   LABORATORY DATA:  I have reviewed the data as listed Lab Results  Component Value Date   WBC 9.3 02/17/2016   HGB 13.9 02/17/2016   HCT 40.5 02/17/2016   MCV 100.3 (H) 02/17/2016   PLT 183 02/17/2016    Recent Labs  10/09/15 0845 11/04/15 0830  11/25/15 0911 12/09/15 1414 01/13/16  1352  02/03/16 1118 02/17/16 1408  NA 140 138  < > 138 137 135 129*  --   K 3.2* 3.0*  < > 3.1* 3.5 2.8* 3.2* 4.0  CL 105 102  < > 103 105 99* 95*  --   CO2 25 25  < > _0 --   GLUCOSE 100* 101*  < > 108* 117* 106* 127*  --   BUN <5* <5*  < > <5* 5* <5* 13  --   CREATININE 0.62 0.53  < > 0.58 0.48 0.54 0.48  --   CALCIUM 8.6* 8.4*  < > 8.7* 9.0 8.3* 8.5*  --   GFRNONAA >60 >60  < > >60 >60 >60 >60  --   GFRAA >60 >60  < > >60 >60 >60 >60  --   PROT 6.5 6.6  --  6.6  --   --   --   --   ALBUMIN 3.7 3.7  --  3.5  --   --   --   --   AST 32 54*  --  25  --   --   --   --   ALT 25 36  --  15  --   --   --   --   ALKPHOS 79 91  --  77  --   --   --   --   BILITOT 0.5 0.7  --  0.5  --   --   --   --   < > = values in this interval not displayed.   ASSESSMENT & PLAN:   Malignant neoplasm of right upper lobe of lung ([place])  # STAGE IV [tiny focus of brain met] Adenocarcinoma T2N2; right upper lobe; s/p  cycle #2 consolidation taxol alone [carbo held sec to breast abcess]. No clinical signs of progression. AUg 1st 2017-PET- partial response.   # Brain metastasis- MRI of the brain- three new mets. Start RT- 8/31; will finish around 9/25.   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   # Oral thrush- improved.    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 lyrica & hydrocodone; fentanyl patch.   # follow up as planned on 9/27-MD/ labs/port flush.     Cammie Sickle, MD 02/22/2016 9:23 AM

## 2016-02-22 NOTE — Addendum Note (Signed)
Addended by: Renita Papa R on: 02/22/2016 09:30 AM   Modules accepted: Orders

## 2016-02-23 ENCOUNTER — Ambulatory Visit
Admission: RE | Admit: 2016-02-23 | Discharge: 2016-02-23 | Disposition: A | Discharge status: Home / Self Care | Payer: BLUE CROSS/BLUE SHIELD | Source: Ambulatory Visit | Attending: Radiation Oncology | Admitting: Radiation Oncology

## 2016-02-23 ENCOUNTER — Inpatient Hospital Stay: Payer: BLUE CROSS/BLUE SHIELD

## 2016-02-23 DIAGNOSIS — C7931 Secondary malignant neoplasm of brain: Secondary | ICD-10-CM | POA: Diagnosis not present

## 2016-02-24 ENCOUNTER — Ambulatory Visit
Admission: RE | Admit: 2016-02-24 | Discharge: 2016-02-24 | Disposition: A | Discharge status: Home / Self Care | Payer: BLUE CROSS/BLUE SHIELD | Source: Ambulatory Visit | Attending: Radiation Oncology | Admitting: Radiation Oncology

## 2016-02-24 ENCOUNTER — Inpatient Hospital Stay: Payer: BLUE CROSS/BLUE SHIELD

## 2016-02-24 DIAGNOSIS — C7931 Secondary malignant neoplasm of brain: Secondary | ICD-10-CM | POA: Diagnosis not present

## 2016-02-24 DIAGNOSIS — C3411 Malignant neoplasm of upper lobe, right bronchus or lung: Secondary | ICD-10-CM | POA: Diagnosis not present

## 2016-02-24 LAB — CBC WITH DIFFERENTIAL/PLATELET
BASOS PCT: 0 %
Basophils Absolute: 0 10*3/uL (ref 0–0.1)
EOS PCT: 0 %
Eosinophils Absolute: 0 10*3/uL (ref 0–0.7)
HEMATOCRIT: 38.7 % (ref 35.0–47.0)
Hemoglobin: 13.4 g/dL (ref 12.0–16.0)
LYMPHS PCT: 4 %
Lymphs Abs: 0.4 10*3/uL — ABNORMAL LOW (ref 1.0–3.6)
MCH: 34.5 pg — ABNORMAL HIGH (ref 26.0–34.0)
MCHC: 34.5 g/dL (ref 32.0–36.0)
MCV: 100 fL (ref 80.0–100.0)
MONO ABS: 0.4 10*3/uL (ref 0.2–0.9)
MONOS PCT: 4 %
NEUTROS ABS: 9 10*3/uL — AB (ref 1.4–6.5)
Neutrophils Relative %: 92 %
PLATELETS: 261 10*3/uL (ref 150–440)
RBC: 3.88 MIL/uL (ref 3.80–5.20)
RDW: 14.1 % (ref 11.5–14.5)
WBC: 9.8 10*3/uL (ref 3.6–11.0)

## 2016-02-24 LAB — COMPREHENSIVE METABOLIC PANEL
ALT: 25 U/L (ref 14–54)
ANION GAP: 9 (ref 5–15)
AST: 21 U/L (ref 15–41)
Albumin: 3.8 g/dL (ref 3.5–5.0)
Alkaline Phosphatase: 52 U/L (ref 38–126)
BILIRUBIN TOTAL: 0.5 mg/dL (ref 0.3–1.2)
BUN: 19 mg/dL (ref 6–20)
CHLORIDE: 101 mmol/L (ref 101–111)
CO2: 24 mmol/L (ref 22–32)
Calcium: 8.9 mg/dL (ref 8.9–10.3)
Creatinine, Ser: 0.55 mg/dL (ref 0.44–1.00)
GFR calc Af Amer: >60 mL/min (ref >60)
Glucose, Bld: 125 mg/dL — ABNORMAL HIGH (ref 65–99)
POTASSIUM: 3.3 mmol/L — AB (ref 3.5–5.1)
Sodium: 134 mmol/L — ABNORMAL LOW (ref 135–145)
TOTAL PROTEIN: 6.7 g/dL (ref 6.5–8.1)

## 2016-02-25 ENCOUNTER — Ambulatory Visit
Admission: RE | Admit: 2016-02-25 | Discharge: 2016-02-25 | Disposition: A | Discharge status: Home / Self Care | Payer: BLUE CROSS/BLUE SHIELD | Source: Ambulatory Visit | Attending: Radiation Oncology | Admitting: Radiation Oncology

## 2016-02-25 DIAGNOSIS — C7931 Secondary malignant neoplasm of brain: Secondary | ICD-10-CM | POA: Diagnosis not present

## 2016-02-26 ENCOUNTER — Ambulatory Visit
Admission: RE | Admit: 2016-02-26 | Discharge: 2016-02-26 | Disposition: A | Discharge status: Home / Self Care | Payer: BLUE CROSS/BLUE SHIELD | Source: Ambulatory Visit | Attending: Radiation Oncology | Admitting: Radiation Oncology

## 2016-02-26 DIAGNOSIS — C7931 Secondary malignant neoplasm of brain: Secondary | ICD-10-CM | POA: Diagnosis not present

## 2016-02-29 ENCOUNTER — Other Ambulatory Visit: Payer: Self-pay

## 2016-02-29 ENCOUNTER — Other Ambulatory Visit: Payer: Self-pay | Admitting: *Deleted

## 2016-02-29 ENCOUNTER — Ambulatory Visit
Admission: RE | Admit: 2016-02-29 | Discharge: 2016-02-29 | Disposition: A | Discharge status: Home / Self Care | Payer: BLUE CROSS/BLUE SHIELD | Source: Ambulatory Visit | Attending: Radiation Oncology | Admitting: Radiation Oncology

## 2016-02-29 DIAGNOSIS — C7931 Secondary malignant neoplasm of brain: Secondary | ICD-10-CM | POA: Diagnosis not present

## 2016-02-29 DIAGNOSIS — C3411 Malignant neoplasm of upper lobe, right bronchus or lung: Secondary | ICD-10-CM

## 2016-02-29 MED ORDER — FENTANYL 50 MCG/HR TD PT72: 50.0000 ug | MEDICATED_PATCH | TRANSDERMAL | 0 refills | Status: DC

## 2016-03-02 ENCOUNTER — Encounter: Payer: Self-pay | Admitting: Internal Medicine

## 2016-03-02 ENCOUNTER — Inpatient Hospital Stay (HOSPITAL_BASED_OUTPATIENT_CLINIC_OR_DEPARTMENT_OTHER): Payer: BLUE CROSS/BLUE SHIELD | Admitting: Internal Medicine

## 2016-03-02 ENCOUNTER — Inpatient Hospital Stay: Payer: BLUE CROSS/BLUE SHIELD

## 2016-03-02 VITALS — BP 122/87 | HR 114 | Temp 98.5°F | Resp 17 | Ht 62.0 in | Wt 143.6 lb

## 2016-03-02 DIAGNOSIS — Z9221 Personal history of antineoplastic chemotherapy: Secondary | ICD-10-CM | POA: Diagnosis not present

## 2016-03-02 DIAGNOSIS — Z95828 Presence of other vascular implants and grafts: Secondary | ICD-10-CM

## 2016-03-02 DIAGNOSIS — C7931 Secondary malignant neoplasm of brain: Secondary | ICD-10-CM | POA: Diagnosis not present

## 2016-03-02 DIAGNOSIS — R200 Anesthesia of skin: Secondary | ICD-10-CM

## 2016-03-02 DIAGNOSIS — H6693 Otitis media, unspecified, bilateral: Secondary | ICD-10-CM

## 2016-03-02 DIAGNOSIS — R630 Anorexia: Secondary | ICD-10-CM

## 2016-03-02 DIAGNOSIS — K861 Other chronic pancreatitis: Secondary | ICD-10-CM

## 2016-03-02 DIAGNOSIS — R51 Headache: Secondary | ICD-10-CM

## 2016-03-02 DIAGNOSIS — B370 Candidal stomatitis: Secondary | ICD-10-CM

## 2016-03-02 DIAGNOSIS — C3411 Malignant neoplasm of upper lobe, right bronchus or lung: Secondary | ICD-10-CM | POA: Diagnosis not present

## 2016-03-02 DIAGNOSIS — Z79899 Other long term (current) drug therapy: Secondary | ICD-10-CM

## 2016-03-02 DIAGNOSIS — R531 Weakness: Secondary | ICD-10-CM

## 2016-03-02 DIAGNOSIS — K219 Gastro-esophageal reflux disease without esophagitis: Secondary | ICD-10-CM

## 2016-03-02 DIAGNOSIS — J069 Acute upper respiratory infection, unspecified: Secondary | ICD-10-CM

## 2016-03-02 DIAGNOSIS — T380X5A Adverse effect of glucocorticoids and synthetic analogues, initial encounter: Secondary | ICD-10-CM

## 2016-03-02 DIAGNOSIS — F17210 Nicotine dependence, cigarettes, uncomplicated: Secondary | ICD-10-CM

## 2016-03-02 DIAGNOSIS — Z923 Personal history of irradiation: Secondary | ICD-10-CM

## 2016-03-02 DIAGNOSIS — R0602 Shortness of breath: Secondary | ICD-10-CM

## 2016-03-02 DIAGNOSIS — R110 Nausea: Secondary | ICD-10-CM

## 2016-03-02 DIAGNOSIS — R1310 Dysphagia, unspecified: Secondary | ICD-10-CM

## 2016-03-02 DIAGNOSIS — C771 Secondary and unspecified malignant neoplasm of intrathoracic lymph nodes: Secondary | ICD-10-CM | POA: Diagnosis not present

## 2016-03-02 DIAGNOSIS — R5382 Chronic fatigue, unspecified: Secondary | ICD-10-CM

## 2016-03-02 DIAGNOSIS — F419 Anxiety disorder, unspecified: Secondary | ICD-10-CM

## 2016-03-02 LAB — CBC WITH DIFFERENTIAL/PLATELET
Basophils Absolute: 0.1 10*3/uL (ref 0–0.1)
Basophils Relative: 1 %
EOS ABS: 0 10*3/uL (ref 0–0.7)
Eosinophils Relative: 0 %
HCT: 39.5 % (ref 35.0–47.0)
HEMOGLOBIN: 13.5 g/dL (ref 12.0–16.0)
LYMPHS ABS: 0.9 10*3/uL — AB (ref 1.0–3.6)
LYMPHS PCT: 10 %
MCH: 34.1 pg — AB (ref 26.0–34.0)
MCHC: 34.1 g/dL (ref 32.0–36.0)
MCV: 100.1 fL — AB (ref 80.0–100.0)
Monocytes Absolute: 0.3 10*3/uL (ref 0.2–0.9)
Monocytes Relative: 4 %
NEUTROS ABS: 8.1 10*3/uL — AB (ref 1.4–6.5)
NEUTROS PCT: 85 %
Platelets: 254 10*3/uL (ref 150–440)
RBC: 3.95 MIL/uL (ref 3.80–5.20)
RDW: 14.8 % — ABNORMAL HIGH (ref 11.5–14.5)
WBC: 9.4 10*3/uL (ref 3.6–11.0)

## 2016-03-02 LAB — COMPREHENSIVE METABOLIC PANEL
ALT: 20 U/L (ref 14–54)
ANION GAP: 13 (ref 5–15)
AST: 27 U/L (ref 15–41)
Albumin: 3.5 g/dL (ref 3.5–5.0)
Alkaline Phosphatase: 56 U/L (ref 38–126)
BUN: 11 mg/dL (ref 6–20)
CHLORIDE: 97 mmol/L — AB (ref 101–111)
CO2: 24 mmol/L (ref 22–32)
Calcium: 9 mg/dL (ref 8.9–10.3)
Creatinine, Ser: 0.58 mg/dL (ref 0.44–1.00)
Glucose, Bld: 147 mg/dL — ABNORMAL HIGH (ref 65–99)
Potassium: 3.4 mmol/L — ABNORMAL LOW (ref 3.5–5.1)
SODIUM: 134 mmol/L — AB (ref 135–145)
Total Bilirubin: 0.6 mg/dL (ref 0.3–1.2)
Total Protein: 6.6 g/dL (ref 6.5–8.1)

## 2016-03-02 MED ORDER — SODIUM CHLORIDE 0.9% FLUSH
10.0000 mL | INTRAVENOUS | Status: AC | PRN
  Administered 2016-03-02: 10 mL via INTRAVENOUS
  Filled 2016-03-02: qty 10

## 2016-03-02 MED ORDER — AZITHROMYCIN 250 MG PO TABS: ORAL_TABLET | ORAL | 0 refills | Status: DC

## 2016-03-02 MED ORDER — HEPARIN SOD (PORK) LOCK FLUSH 100 UNIT/ML IV SOLN
500.0000 [IU] | Freq: Once | INTRAVENOUS | Status: AC
  Administered 2016-03-02: 500 [IU] via INTRAVENOUS

## 2016-03-02 NOTE — Progress Notes (Signed)
Pt complains today of a left earache, cold like symptoms, and increasing SOB

## 2016-03-02 NOTE — Assessment & Plan Note (Addendum)
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 No clinical signs of progression. AUG 1st 2017-PET- partial response.   # no clinical progression- recommend CT chest again in 1st week of NOV.   # Brain metastasis- MRI of the brain- three new mets.  S/P RT finished on9/25.   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   # Oral thrush- present sec to steroids- recommend Nystatin prn.   # URI/ ear infection- recommend z-pak.    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 lyrica & hydrocodone; fentanyl patch- better.   # follow up in 3 weeks/ no  Labs.. Will order CT at that visit.    I reviewed the images myself and with the patient.

## 2016-03-02 NOTE — Progress Notes (Signed)
[facility] CONSULT NOTE  Patient Care Team: Elgie Collard, MD as PCP - General (Obstetrics and Gynecology) Christene Lye, MD (General Surgery)  CHIEF COMPLAINTS/PURPOSE OF CONSULTATION:   Oncology History   # FEB 2017- ADENOCARCINOMA RUL [T2N2; STAGE III Bronch/FNA- Right paratracheal LN;Dr.Ram]. PET- no distant mets; March 3rd START CARBO-TAXOL Weekly +RT [until may 5th?]; June 21st 2017- last dose of consolidation carbo-taxol; Jan 12 2016- PET- Improved on RUL mass/LN  # FEB 2017- MRI Brain 77m focus of met [Right frontal]; Jan 12 2016- 3 new brain mets [start WBRT 8/31]  # left breast abscess; Aug 2017- PET ? Right Breast abcess  # "Chronic pancreatitis"/ smoker [15ppt]   # MOLECULAR STUDIES: ALK-NEG;ROS-1-NEG     Malignant neoplasm of right upper lobe of lung ([facility]   07/30/2015 Initial Diagnosis    Malignant neoplasm of right upper lobe of lung ([facility])        HISTORY OF PRESENTING ILLNESS:  Tiffany Erickson 45y.o.  female  history of stage IV lung cancer adenocarcinoma currently Status post consolidation chemotherapy- With a recurrent brain metastases-s/p  whole brain radiation 2 days ago.   Patient complains of mild difficulty swallowing. Also complains of bilateral earache/ difficulty eating. No fevers or chills.  Pain in the legs has improved. She is currently on fentanyl patch she short-acting pain medication Lyrica. Mild tingling and numbness. No falls.; Patient is taking dexamethasone 2 mg once a day   Patient Chronic mild shortness of breath chronic fatigue. Denies any fevers or chills.  No cough or hemoptysis.   Patient complains of poor appetite. Continues to chronic abdominal discomfort; Chronic nausea.   ROS: A complete 10 point review of system is done which is negative except mentioned above in history of present illness  MEDICAL HISTORY:  Past Medical History:  Diagnosis Date  . Anxiety   . Broken ribs 05/2015   5 ribs  . Chronic  [place]ancreatitis ([facility]   . Concussion 06/01/2015   S/P fall-PT STATES IN PHONE INTERVIEW THAT SHE IS UNSURE IF SHE HAD A CONCUSSION OR NOT  . Frequent UTI    "sometimes" (06/01/2015)  . GERD (gastroesophageal reflux disease)   . Lung cancer [place] 07/13/15   METASTATIC ADENOCARCINOMA OF THE LUNG  . Lung mass    right side noted 06/01/2015  . Multiple fractures of ribs of right side 06/01/2015   "5" S/P fall  LEFT SIDE  . Shortness of breath dyspnea    5 rib fx right side    SURGICAL HISTORY: Past Surgical History:  Procedure Laterality Date  . ENDOBRONCHIAL ULTRASOUND N/A 07/13/2015   Procedure: ENDOBRONCHIAL ULTRASOUND;  Surgeon: PLaverle Hobby MD;  Location: [facility] ORS;  Service: Pulmonary;  Laterality: N/A;  . ESOPHAGOGASTRODUODENOSCOPY    . EYE MUSCLE SURGERY Left ~ 1979  . MYRINGOTOMY WITH TUBE PLACEMENT Bilateral   . PORTACATH PLACEMENT Left 07/27/2015   Procedure: INSERTION PORT-A-CATH;  Surgeon: TNestor Lewandowsky MD;  Location: [facility] ORS;  Service: General;  Laterality: Left;  . TONSILLECTOMY      SOCIAL HISTORY: Social History   Social History  . Marital status: Single    Spouse name: N/A  . Number of children: N/A  . Years of education: N/A   Occupational History  . Not on file.   Social History Main Topics  . Smoking status: Current Every Day Smoker    Packs/day: 0.50    Years: 28.00    Types: Cigarettes  . Smokeless tobacco: Never Used  .  Alcohol use 8.4 oz/week    2 Glasses of wine, 12 Cans of beer per week     Comment: 06/01/2015 "drink probably 4 times/wk; either ;4 beers or 2 glasses of wine when I do drink"  . Drug use: No  . Sexual activity: Not Currently   Other Topics Concern  . Not on file   Social History Narrative  . No narrative on file    FAMILY HISTORY: Family History  Problem Relation Age of Onset  . Colon cancer Maternal Grandfather   . CAD Maternal Grandfather   . Pancreatic cancer Maternal Grandmother     ALLERGIES:  is allergic  to sulfa antibiotics.  MEDICATIONS:  Current Outpatient Prescriptions  Medication Sig Dispense Refill  . ALPRAZolam (XANAX) 1 MG tablet Take 0.5-1 mg by mouth 3 (three) times daily as needed for anxiety.     Marland Kitchen dexamethasone (DECADRON) 4 MG tablet Take 1 tablet (4 mg total) by mouth 2 (two) times daily with a meal. 30 tablet 1  . fentaNYL (DURAGESIC - DOSED MCG/HR) 50 MCG/HR Place 1 patch (50 mcg total) onto the skin every 3 (three) days. 5 patch 0  . HYDROcodone-acetaminophen (NORCO/VICODIN) 5-325 MG tablet Take 1 tablet by mouth every 8 (eight) hours as needed for moderate pain. 90 tablet 0  . lidocaine-prilocaine (EMLA) cream Apply 1 application topically as needed. 30 g 6  . metoCLOPramide (REGLAN) 10 MG tablet Take 1 tablet (10 mg total) by mouth every 8 (eight) hours as needed for nausea or vomiting. 10 tablet 0  . nystatin (MYCOSTATIN) 100000 UNIT/ML suspension Take 5 mLs (500,000 Units total) by mouth 4 (four) times daily. 60 mL 0  . ondansetron (ZOFRAN) 8 MG tablet Take 1 tablet (8 mg total) by mouth every 8 (eight) hours as needed for nausea or vomiting (start 3 days; after chemo). 40 tablet 3  . pantoprazole (PROTONIX) 20 MG tablet Take 20 mg by mouth at bedtime.     . pramipexole (MIRAPEX) 0.5 MG tablet TK 1 T PO QHS  2  . pregabalin (LYRICA) 75 MG capsule Take 1 capsule (75 mg total) by mouth 2 (two) times daily. 60 capsule 3  . prochlorperazine (COMPAZINE) 10 MG tablet Take 1 tablet (10 mg total) by mouth every 6 (six) hours as needed for nausea or vomiting. 40 tablet 1  . promethazine (PHENERGAN) 25 MG tablet Take 25 mg by mouth every 8 (eight) hours as needed for nausea or vomiting.    Marland Kitchen azithromycin (ZITHROMAX) 250 MG tablet Take 2 on day 1; and then 1 pill once a day. 6 each 0  . fluconazole (DIFLUCAN) 100 MG tablet Take 1 tablet (100 mg total) by mouth daily. (Patient not taking: Reported on 03/02/2016) 7 tablet 0  . traZODone (DESYREL) 100 MG tablet   2  . zolpidem (AMBIEN) 5  MG tablet Take 5 mg by mouth at bedtime as needed for sleep.     No current facility-administered medications for this visit.    Facility-Administered Medications Ordered in Other Visits  Medication Dose Route Frequency Provider Last Rate Last Dose  . sodium chloride flush (NS) 0.9 % injection 10 mL  10 mL Intravenous PRN Cammie Sickle, MD   10 mL at 03/02/16 1107      .  PHYSICAL EXAMINATION: ECOG PERFORMANCE STATUS: 0 - Asymptomatic  Vitals:   03/02/16 1116  BP: 122/87  Pulse: (!) 114  Resp: 17  Temp: 98.5 F (36.9 C)   Filed Weights  03/02/16 1116  Weight: 143 lb 9.6 oz (65.1 kg)    GENERAL: Well-nourished well-developed; Alert, no distress and comfortable.She is Alone.; moon face is cushingoid EYES: no pallor or icterus OROPHARYNX: Positive for thrush ; no ulceration; good dentition  NECK: supple, no masses felt LYMPH:  no palpable lymphadenopathy in the cervical, axillary or inguinal regions LUNGS: clear to auscultation and  No wheeze or crackles HEART/CVS: regular rate & rhythm and no murmurs; No lower extremity edema ABDOMEN: abdomen soft, non-tender and normal bowel sounds Musculoskeletal:no cyanosis of digits and no clubbing  PSYCH: alert & oriented x 3 with fluent speech; anxious NEURO: no focal motor/sensory deficits SKIN: 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 mediport- no signs of infection.   LABORATORY DATA:  I have reviewed the data as listed Lab Results  Component Value Date   WBC 9.4 03/02/2016   HGB 13.5 03/02/2016   HCT 39.5 03/02/2016   MCV 100.1 (H) 03/02/2016   PLT 254 03/02/2016    Recent Labs  11/25/15 0911  02/03/16 1118 02/17/16 1408 02/24/16 1345 03/02/16 1050  NA 138  < > 129*  --  134* 134*  K 3.1*  < > 3.2* 4.0 3.3* 3.4*  CL 103  < > 95*  --  101 97*  CO2 28  < > 26  --  24 24  GLUCOSE 108*  < > 127*  --  125* 147*  BUN <5*  < > 13  --  19 11  CREATININE 0.58  < > 0.48  --  0.55 0.58  CALCIUM 8.7*  <  > 8.5*  --  8.9 9.0  GFRNONAA >60  < > >60  --  >60 >60  GFRAA >60  < > >60  --  >60 >60  PROT 6.6  --   --   --  6.7 6.6  ALBUMIN 3.5  --   --   --  3.8 3.5  AST 25  --   --   --  21 27  ALT 15  --   --   --  25 20  ALKPHOS 77  --   --   --  52 56  BILITOT 0.5  --   --   --  0.5 0.6  < > = values in this interval not displayed.   ASSESSMENT & PLAN:   Malignant neoplasm of right upper lobe of lung ([place])  # STAGE IV [tiny focus of brain met] Adenocarcinoma T2N2; right upper lobe; s/p  cycle #2 consolidation taxol alone [carbo held sec to breast abcess]. No clinical signs of progression. AUG 1st 2017-PET- partial response.   # no clinical progression- recommend CT chest again in 1st week of NOV.   # Brain metastasis- MRI of the brain- three new mets.  S/P RT finished on9/25.   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   # Oral thrush- present sec to steroids- recommend Nystatin prn.   # URI/ ear infection- recommend z-pak.    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 lyrica & hydrocodone; fentanyl patch- better.   # follow up in 3 weeks/ no  Labs.. Will order CT at that visit.    I reviewed the images myself and with the patient.     Cammie Sickle, MD 03/02/2016 12:52 PM [place] CONSULT NOTE  Patient Care Team: Elgie Collard, MD as PCP -  General (Obstetrics and Gynecology) Christene Lye, MD (General Surgery)  CHIEF COMPLAINTS/PURPOSE OF CONSULTATION:   Oncology History   # FEB 2017- ADENOCARCINOMA RUL [T2N2; STAGE III Bronch/FNA- Right paratracheal LN;Dr.Ram]. PET- no distant mets; March 3rd START CARBO-TAXOL Weekly +RT [until may 5th?]; June 21st 2017- last dose of consolidation carbo-taxol; Jan 12 2016- PET- Improved on RUL mass/LN  # FEB 2017- MRI Brain 67m focus of met [Right frontal]; Jan 12 2016- 3 new brain mets [start WBRT 8/31]  # left breast abscess; Aug 2017- PET ?  Right Breast abcess  # "Chronic pancreatitis"/ smoker [15ppt]   # MOLECULAR STUDIES: ALK-NEG;ROS-1-NEG     [place]   07/30/2015 Initial Diagnosis    Malignant neoplasm of right upper lobe of lung (H[facility])        HISTORY OF PRESENTING ILLNESS:  Tiffany Erickson 45y.o.  female  history of stage IV lung cancer adenocarcinoma currently Status post consolidation chemotherapy is here to review the results of her PET scan and also brain MRI.  Patient complains of intermittent headaches during the daytime. She notes she has swelling of the right breast; she had been on doxycycline which was causing her stomach upset.    Otherwise no fevers. Patient Chronic mild shortness of breath chronic fatigue. Denies any fevers or chills.  No cough or hemoptysis. No swelling in the legs or arms.   Patient complains of poor appetite. Continues to chronic abdominal discomfort; Chronic nausea. Positive for fatigue.   ROS: A complete 10 point review of system is done which is negative except mentioned above in history of present illness  MEDICAL HISTORY:  Past Medical History:  Diagnosis Date  . Anxiety   . Broken ribs 05/2015   5 ribs  . Chronic [place]itis ([facility]   . Concussion 06/01/2015   S/P fall-PT STATES IN PHONE INTERVIEW THAT SHE IS UNSURE IF SHE HAD A CONCUSSION OR NOT  . Frequent UTI    "sometimes" (06/01/2015)  . GERD (gastroesophageal reflux disease)   . [place] ([facility]   METASTATIC ADENOCARCINOMA OF THE LUNG  . Lung mass    right side noted 06/01/2015  . Multiple fractures of ribs of right side 06/01/2015   "5" S/P fall  LEFT SIDE  . Shortness of breath dyspnea    5 rib fx right side    SURGICAL HISTORY: Past Surgical History:  Procedure Laterality Date  . ENDOBRONCHIAL ULTRASOUND N/A 07/13/2015   Procedure: ENDOBRONCHIAL ULTRASOUND;  Surgeon: PLaverle Hobby MD;  Location: [facility] ORS;  Service: Pulmonary;  Laterality: N/A;  .  ESOPHAGOGASTRODUODENOSCOPY    . EYE MUSCLE SURGERY Left ~ 1979  . MYRINGOTOMY WITH TUBE PLACEMENT Bilateral   . PORTACATH PLACEMENT Left 07/27/2015   Procedure: INSERTION PORT-A-CATH;  Surgeon: TNestor Lewandowsky MD;  Location: [facility] ORS;  Service: General;  Laterality: Left;  . TONSILLECTOMY      SOCIAL HISTORY: Social History   Social History  . Marital status: Single    Spouse name: N/A  . Number of children: N/A  . Years of education: N/A   Occupational History  . Not on file.   Social History Main Topics  . Smoking status: Current Every Day Smoker    Packs/day: 0.50    Years: 28.00    Types: Cigarettes  . Smokeless tobacco: Never Used  . Alcohol use 8.4 oz/week    2 Glasses of wine, 12 Cans of beer per week  Comment: 06/01/2015 "drink probably 4 times/wk; either ;4 beers or 2 glasses of wine when I do drink"  . Drug use: No  . Sexual activity: Not Currently   Other Topics Concern  . Not on file   Social History Narrative  . No narrative on file    FAMILY HISTORY: Family History  Problem Relation Age of Onset  . Colon cancer Maternal Grandfather   . CAD Maternal Grandfather   . Pancreatic cancer Maternal Grandmother     ALLERGIES:  is allergic to sulfa antibiotics.  MEDICATIONS:  Current Outpatient Prescriptions  Medication Sig Dispense Refill  . ALPRAZolam (XANAX) 1 MG tablet Take 0.5-1 mg by mouth 3 (three) times daily as needed for anxiety.     Marland Kitchen dexamethasone (DECADRON) 4 MG tablet Take 1 tablet (4 mg total) by mouth 2 (two) times daily with a meal. 30 tablet 1  . fentaNYL (DURAGESIC - DOSED MCG/HR) 50 MCG/HR Place 1 patch (50 mcg total) onto the skin every 3 (three) days. 5 patch 0  . HYDROcodone-acetaminophen (NORCO/VICODIN) 5-325 MG tablet Take 1 tablet by mouth every 8 (eight) hours as needed for moderate pain. 90 tablet 0  . lidocaine-prilocaine (EMLA) cream Apply 1 application topically as needed. 30 g 6  . metoCLOPramide (REGLAN) 10 MG tablet Take 1  tablet (10 mg total) by mouth every 8 (eight) hours as needed for nausea or vomiting. 10 tablet 0  . nystatin (MYCOSTATIN) 100000 UNIT/ML suspension Take 5 mLs (500,000 Units total) by mouth 4 (four) times daily. 60 mL 0  . ondansetron (ZOFRAN) 8 MG tablet Take 1 tablet (8 mg total) by mouth every 8 (eight) hours as needed for nausea or vomiting (start 3 days; after chemo). 40 tablet 3  . pantoprazole (PROTONIX) 20 MG tablet Take 20 mg by mouth at bedtime.     . pramipexole (MIRAPEX) 0.5 MG tablet TK 1 T PO QHS  2  . pregabalin (LYRICA) 75 MG capsule Take 1 capsule (75 mg total) by mouth 2 (two) times daily. 60 capsule 3  . prochlorperazine (COMPAZINE) 10 MG tablet Take 1 tablet (10 mg total) by mouth every 6 (six) hours as needed for nausea or vomiting. 40 tablet 1  . promethazine (PHENERGAN) 25 MG tablet Take 25 mg by mouth every 8 (eight) hours as needed for nausea or vomiting.    Marland Kitchen azithromycin (ZITHROMAX) 250 MG tablet Take 2 on day 1; and then 1 pill once a day. 6 each 0  . fluconazole (DIFLUCAN) 100 MG tablet Take 1 tablet (100 mg total) by mouth daily. (Patient not taking: Reported on 03/02/2016) 7 tablet 0  . traZODone (DESYREL) 100 MG tablet   2  . zolpidem (AMBIEN) 5 MG tablet Take 5 mg by mouth at bedtime as needed for sleep.     No current facility-administered medications for this visit.    Facility-Administered Medications Ordered in Other Visits  Medication Dose Route Frequency Provider Last Rate Last Dose  . sodium chloride flush (NS) 0.9 % injection 10 mL  10 mL Intravenous PRN Cammie Sickle, MD   10 mL at 03/02/16 1107      .  PHYSICAL EXAMINATION: ECOG PERFORMANCE STATUS: 0 - Asymptomatic  Vitals:   03/02/16 1116  BP: 122/87  Pulse: (!) 114  Resp: 17  Temp: 98.5 F (36.9 C)   Filed Weights   03/02/16 1116  Weight: 143 lb 9.6 oz (65.1 kg)    GENERAL: Well-nourished well-developed; Alert, no distress and comfortable.She  is Accompanied by her aunt/mother.  Moon facies.  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 mediport- no signs of infection.   LABORATORY DATA:  I have reviewed the data as listed Lab Results  Component Value Date   WBC 9.4 03/02/2016   HGB 13.5 03/02/2016   HCT 39.5 03/02/2016   MCV 100.1 (H) 03/02/2016   PLT 254 03/02/2016    Recent Labs  11/25/15 0911  02/03/16 1118 02/17/16 1408 02/24/16 1345 03/02/16 1050  NA 138  < > 129*  --  134* 134*  K 3.1*  < > 3.2* 4.0 3.3* 3.4*  CL 103  < > 95*  --  101 97*  CO2 28  < > 26  --  24 24  GLUCOSE 108*  < > 127*  --  125* 147*  BUN <5*  < > 13  --  19 11  CREATININE 0.58  < > 0.48  --  0.55 0.58  CALCIUM 8.7*  < > 8.5*  --  8.9 9.0  GFRNONAA >60  < > >60  --  >60 >60  GFRAA >60  < > >60  --  >60 >60  PROT 6.6  --   --   --  6.7 6.6  ALBUMIN 3.5  --   --   --  3.8 3.5  AST 25  --   --   --  21 27  ALT 15  --   --   --  25 20  ALKPHOS 77  --   --   --  52 56  BILITOT 0.5  --   --   --  0.5 0.6  < > = values in this interval not displayed.   ASSESSMENT & PLAN:   Malignant neoplasm of right upper lobe of lung ([place])  # STAGE IV [tiny focus of brain met] Adenocarcinoma T2N2; right upper lobe; s/p  cycle #2 consolidation taxol alone [carbo held sec to breast abcess]. No clinical signs of progression. AUG 1st 2017-PET- partial response.   # no clinical progression- recommend CT chest again in 1st week of NOV.   # Brain metastasis- MRI  of the brain- three new mets.  S/P RT finished on9/25.   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   # Oral thrush- present sec to steroids- recommend Nystatin prn.   # URI/ ear infection- recommend z-pak.    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 lyrica & hydrocodone; fentanyl patch- better.   # follow up in 3 weeks/ no  Labs.. Will order CT at that visit.    I reviewed the images myself and with the patient.     Cammie Sickle, MD 03/02/2016 12:52 PM

## 2016-03-04 LAB — CYTOLOGY - NON PAP

## 2016-03-07 ENCOUNTER — Telehealth: Payer: Self-pay | Admitting: *Deleted

## 2016-03-07 NOTE — Telephone Encounter (Signed)
States she does not have a cold, but she has a sinus infection and the Zpak given Wed has not touched it. Asking for abx for "sinus infection, ears plugged, runny/stuffy nose". Please advise.

## 2016-03-07 NOTE — Telephone Encounter (Signed)
Patient showed up in office requesting to be seen. Per Dr B, she needs an OTC antihistamine and he cannot see her today. Patient informed of this and stated "but it has settled in my ears, I can't hear anything" Advised to take antihistamine and perhaps she could see her PCP

## 2016-03-17 ENCOUNTER — Encounter: Payer: Self-pay | Admitting: Internal Medicine

## 2016-03-23 ENCOUNTER — Ambulatory Visit: Payer: BLUE CROSS/BLUE SHIELD | Admitting: Internal Medicine

## 2016-03-24 ENCOUNTER — Ambulatory Visit
Admission: RE | Admit: 2016-03-24 | Discharge: 2016-03-24 | Disposition: A | Discharge status: Home / Self Care | Payer: BLUE CROSS/BLUE SHIELD | Source: Ambulatory Visit | Attending: Radiation Oncology | Admitting: Radiation Oncology

## 2016-03-24 ENCOUNTER — Inpatient Hospital Stay: Payer: BLUE CROSS/BLUE SHIELD | Attending: Internal Medicine | Admitting: Internal Medicine

## 2016-03-24 VITALS — BP 131/89 | HR 119 | Temp 97.8°F | Resp 18 | Wt 150.8 lb

## 2016-03-24 DIAGNOSIS — C7931 Secondary malignant neoplasm of brain: Secondary | ICD-10-CM | POA: Insufficient documentation

## 2016-03-24 DIAGNOSIS — C3411 Malignant neoplasm of upper lobe, right bronchus or lung: Secondary | ICD-10-CM | POA: Insufficient documentation

## 2016-03-24 DIAGNOSIS — B370 Candidal stomatitis: Secondary | ICD-10-CM

## 2016-03-24 DIAGNOSIS — R1310 Dysphagia, unspecified: Secondary | ICD-10-CM | POA: Diagnosis not present

## 2016-03-24 DIAGNOSIS — F17210 Nicotine dependence, cigarettes, uncomplicated: Secondary | ICD-10-CM | POA: Diagnosis not present

## 2016-03-24 DIAGNOSIS — R531 Weakness: Secondary | ICD-10-CM

## 2016-03-24 DIAGNOSIS — Z79899 Other long term (current) drug therapy: Secondary | ICD-10-CM

## 2016-03-24 DIAGNOSIS — Z923 Personal history of irradiation: Secondary | ICD-10-CM

## 2016-03-24 DIAGNOSIS — F419 Anxiety disorder, unspecified: Secondary | ICD-10-CM | POA: Diagnosis not present

## 2016-03-24 DIAGNOSIS — J069 Acute upper respiratory infection, unspecified: Secondary | ICD-10-CM

## 2016-03-24 DIAGNOSIS — K861 Other chronic pancreatitis: Secondary | ICD-10-CM | POA: Insufficient documentation

## 2016-03-24 DIAGNOSIS — K219 Gastro-esophageal reflux disease without esophagitis: Secondary | ICD-10-CM | POA: Insufficient documentation

## 2016-03-24 MED ORDER — ALBUTEROL SULFATE HFA 108 (90 BASE) MCG/ACT IN AERS: 2.0000 | INHALATION_SPRAY | Freq: Four times a day (QID) | RESPIRATORY_TRACT | 2 refills | Status: AC | PRN

## 2016-03-24 NOTE — Assessment & Plan Note (Addendum)
#  STAGE IV [place] focus of brain met] Adenocarcinoma T2N2; right upper lobe; s/p  cycle #2 consolidation taxol alone [carbo held sec to breast abcess]. No clinical signs of progression. AUG 1st 2017-PET- partial response. No clinical progression. Recommend  CT in 6 weeks.   # Brain metastasis- MRI of the brain- three new mets.  S/P RT finished on9/25. Repeat MRI brain.   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   # Oral thrush- present sec to steroids- recommend Nystatin prn. Improved.    # URI/ ear fullness- cerumen impaction. Recommend home kit.   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 lyrica & hydrocodone; fentanyl patch- better.   # follow up in 7 weeks/ CT/MRI in 6 weeks.

## 2016-03-24 NOTE — Progress Notes (Signed)
Radiation Oncology Follow up Note  Name: Tiffany Erickson   Date:   03/24/2016 MRN:  [id number] DOB: January 19, 1971    This 45 y.o. female presents to the clinic today for stage IV adenocarcinoma the right upper lobe with brain metastasis now out 1 month.  REFERRING PROVIDER: No ref. provider found  HPI: Patient is a 45 year old female with history of stage IV adenocarcinoma the right upper lobe. She's been treated both to her brain as well as her chest and is seen today in routine follow-up. Patient does have chronic pancreatitis. She is currently on consolidative Taxol and is clinically stable. Her major concern at this time is your pain she's been tried on a Z-Pak without success. She is also under good pain control with sentinel patch at this time. Oral thrush has cleared.  COMPLICATIONS OF TREATMENT: none  FOLLOW UP COMPLIANCE: keeps appointments   PHYSICAL EXAM:  BP 131/89   Pulse (!) 119   Temp 97.8 F (36.6 C)   Resp 18   Wt 150 lb 12.7 oz (68.4 kg)   BMI 27.58 kg/m  Well-developed female with steroid appearing face. No change in gross neurologic exam. Lungs are clear to A&P. Well-developed well-nourished patient in NAD. HEENT reveals PERLA, EOMI, discs not visualized.  Oral cavity is clear. No oral mucosal lesions are identified. Neck is clear without evidence of cervical or supraclavicular adenopathy. Lungs are clear to A&P. Cardiac examination is essentially unremarkable with regular rate and rhythm without murmur rub or thrill. Abdomen is benign with no organomegaly or masses noted. Motor sensory and DTR levels are equal and symmetric in the upper and lower extremities. Cranial nerves II through XII are grossly intact. Proprioception is intact. No peripheral adenopathy or edema is identified. No motor or sensory levels are noted. Crude visual fields are within normal range.  RADIOLOGY RESULTS: No current films for review  PLAN: At the present time she continues with medical oncology  on consolidative chemotherapy. I see no further role for radiation therapy at this time and will turn follow-up care over to medical oncology. I've asked her to start some Claritin-D as she may have some inner ear fluid from her radiation treatments to her whole brain. Patient knows to contact my office with any concerns and I will certainly reevaluate her any time should further palliative treatment be indicated. She is scheduled for follow-up CT scans of the chest in the next several weeks and I will review them when they become available.  I would like to take this opportunity to thank you for allowing me to participate in the care of your patient.Armstead Peaks., MD

## 2016-03-24 NOTE — Progress Notes (Signed)
Complains that ears are stopped up for last month.  More SOB and would like to know about an inhaler or treatment.  Some Nausea in the mornings

## 2016-03-24 NOTE — Progress Notes (Signed)
[facility] CONSULT NOTE  Patient Care Team: Elgie Collard, MD as PCP - General (Obstetrics and Gynecology) Christene Lye, MD (General Surgery)  CHIEF COMPLAINTS/PURPOSE OF CONSULTATION:   Oncology History   # FEB 2017- ADENOCARCINOMA RUL [T2N2; STAGE III Bronch/FNA- Right paratracheal LN;Dr.Ram]. PET- no distant mets; March 3rd START CARBO-TAXOL Weekly +RT [until may 5th?]; June 21st 2017- last dose of consolidation carbo-taxol; Jan 12 2016- PET- Improved on RUL mass/LN  # FEB 2017- MRI Brain 67m focus of met [Right frontal]; Jan 12 2016- 3 new brain mets [start WBRT 8/31]  # left breast abscess; Aug 2017- PET ? Right Breast abcess  # "Chronic pancreatitis"/ smoker [15ppt]   # MOLECULAR STUDIES: ALK-NEG;ROS-1-NEG     [place]   07/[place]/2015 Initial Diagnosis    Malignant neoplasm of right upper lobe of lung (HCC)        HISTORY OF PRESENTING ILLNESS:  Nayara J Morlock 45y.o.  female  history of stage IV lung cancer adenocarcinoma currently Status post consolidation chemotherapy- With a recurrent brain metastases-s/p  whole brain radiation finished few weeks ago.  Patient difficulty swallowing is improved. She continues have difficulty hearing bilateral earache. No fevers or chills.   Neuropathy is improved. She continues on fentanyl patch and also Lyrica she is tapering down on the steroids. Taking Last dose of dexamethasone today.  Patient Chronic mild shortness of breath chronic fatigue. Denies any fevers or chills.  No cough or hemoptysis.  Continues to chronic abdominal discomfort; Chronic nausea.   ROS: A complete 10 point review of system is done which is negative except mentioned above in history of present illness  MEDICAL HISTORY:  Past Medical History:  Diagnosis Date  . Anxiety   . Broken ribs 05/2015   5 ribs  . [place]itis ([place]   . Concussion 06/01/2015   S/P fall-PT STATES IN PHONE  INTERVIEW THAT SHE IS UNSURE IF SHE HAD A CONCUSSION OR NOT  . Frequent UTI    "sometimes" (06/01/2015)  . GERD (gastroesophageal reflux disease)   . [place] 07/[place]/15   METASTATIC ADENOCARCINOMA OF THE LUNG  . Lung mass    right side noted 06/01/2015  . Multiple fractures of ribs of right side 06/01/2015   "5" S/P fall  LEFT SIDE  . Shortness of breath dyspnea    5 rib fx right side    SURGICAL HISTORY: Past Surgical History:  Procedure Laterality Date  . ENDOBRONCHIAL ULTRASOUND N/A 07/13/2015   Procedure: ENDOBRONCHIAL ULTRASOUND;  Surgeon: PLaverle Hobby MD;  Location: [facility] ORS;  Service: Pulmonary;  Laterality: N/A;  . ESOPHAGOGASTRODUODENOSCOPY    . EYE MUSCLE SURGERY Left ~ 1979  . MYRINGOTOMY WITH TUBE PLACEMENT Bilateral   . PORTACATH PLACEMENT Left 07/27/2015   Procedure: INSERTION PORT-A-CATH;  Surgeon: TNestor Lewandowsky MD;  Location: [facility] ORS;  Service: General;  Laterality: Left;  . TONSILLECTOMY      SOCIAL HISTORY: Social History   Social History  . Marital status: Single    Spouse name: N/A  . Number of children: N/A  . Years of education: N/A   Occupational History  . Not on file.   Social History Main Topics  . Smoking status: Current Every Day Smoker    Packs/day: 0.50    Years: 28.00    Types: Cigarettes  . Smokeless tobacco: Never Used  . Alcohol use 8.4 oz/week    2 Glasses of wine, 12 Cans  of beer per week     Comment: 06/01/2015 "drink probably 4 times/wk; either ;4 beers or 2 glasses of wine when I do drink"  . Drug use: No  . Sexual activity: Not Currently   Other Topics Concern  . Not on file   Social History Narrative  . No narrative on file    FAMILY HISTORY: Family History  Problem Relation Age of Onset  . Colon cancer Maternal Grandfather   . CAD Maternal Grandfather   . Pancreatic cancer Maternal Grandmother     ALLERGIES:  is allergic to sulfa antibiotics.  MEDICATIONS:  Current Outpatient Prescriptions   Medication Sig Dispense Refill  . albuterol (PROVENTIL HFA;VENTOLIN HFA) 108 (90 Base) MCG/ACT inhaler Inhale 2 puffs into the lungs every 6 (six) hours as needed for wheezing or shortness of breath. 1 Inhaler 2  . ALPRAZolam (XANAX) 1 MG tablet Take 0.5-1 mg by mouth 3 (three) times daily as needed for anxiety.     Marland Kitchen amoxicillin-clavulanate (AUGMENTIN) 500-125 MG tablet TK 1 T PO BID FOR 10 DAYS  0  . dexamethasone (DECADRON) 4 MG tablet Take 1 tablet (4 mg total) by mouth 2 (two) times daily with a meal. 30 tablet 1  . fentaNYL (DURAGESIC - DOSED MCG/HR) 50 MCG/HR Place 1 patch (50 mcg total) onto the skin every 3 (three) days. 5 patch 0  . fluconazole (DIFLUCAN) 100 MG tablet Take 1 tablet (100 mg total) by mouth daily. (Patient not taking: Reported on 03/24/2016) 7 tablet 0  . HYDROcodone-acetaminophen (NORCO/VICODIN) 5-325 MG tablet Take 1 tablet by mouth every 8 (eight) hours as needed for moderate pain. 90 tablet 0  . lidocaine-prilocaine (EMLA) cream Apply 1 application topically as needed. 30 g 6  . metoCLOPramide (REGLAN) 10 MG tablet Take 1 tablet (10 mg total) by mouth every 8 (eight) hours as needed for nausea or vomiting. 10 tablet 0  . nystatin (MYCOSTATIN) 100000 UNIT/ML suspension Take 5 mLs (500,000 Units total) by mouth 4 (four) times daily. 60 mL 0  . ondansetron (ZOFRAN) 8 MG tablet Take 1 tablet (8 mg total) by mouth every 8 (eight) hours as needed for nausea or vomiting (start 3 days; after chemo). 40 tablet 3  . pantoprazole (PROTONIX) 20 MG tablet Take 20 mg by mouth at bedtime.     . pramipexole (MIRAPEX) 0.5 MG tablet TK 1 T PO QHS  2  . pregabalin (LYRICA) 75 MG capsule Take 1 capsule (75 mg total) by mouth 2 (two) times daily. 60 capsule 3  . prochlorperazine (COMPAZINE) 10 MG tablet Take 1 tablet (10 mg total) by mouth every 6 (six) hours as needed for nausea or vomiting. 40 tablet 1  . promethazine (PHENERGAN) 25 MG tablet Take 25 mg by mouth every 8 (eight) hours as  needed for nausea or vomiting.    . traZODone (DESYREL) 100 MG tablet   2  . zolpidem (AMBIEN) 5 MG tablet Take 5 mg by mouth at bedtime as needed for sleep.     No current facility-administered medications for this visit.    Facility-Administered Medications Ordered in Other Visits  Medication Dose Route Frequency Provider Last Rate Last Dose  . sodium chloride flush (NS) 0.9 % injection 10 mL  10 mL Intravenous PRN Cammie Sickle, MD   10 mL at 03/02/16 1107      .  PHYSICAL EXAMINATION: ECOG PERFORMANCE STATUS: 0 - Asymptomatic  There were no vitals filed for this visit. There were no vitals  filed for this visit.  GENERAL: Well-nourished well-developed; Alert, no distress and comfortable.She Accompanied by her mother..; moon face is cushingoid EYES: no pallor or icterus OROPHARYNX: No thrush significantly improved. no ulceration; good dentition; bilateral ears- cerumen impaction. 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 mediport- no signs of infection.   LABORATORY DATA:  I have reviewed the data as listed Lab Results  Component Value Date   WBC 9.4 03/02/2016   HGB 13.5 03/02/2016   HCT 39.5 03/02/2016   MCV 100.1 (H) 03/02/2016   PLT 254 03/02/2016    Recent Labs  11/25/15 0911  02/03/16 1118 02/17/16 1408 02/24/16 1345 03/02/16 1050  NA 138  < > 129*  --  134* 134*  K 3.1*  < > 3.2* 4.0 3.3* 3.4*  CL 103  < > 95*  --  101 97*  CO2 28  < > 26  --  24 24  GLUCOSE 108*  < > 127*  --  125* 147*   BUN <5*  < > 13  --  19 11  CREATININE 0.58  < > 0.48  --  0.55 0.58  CALCIUM 8.7*  < > 8.5*  --  8.9 9.0  GFRNONAA >60  < > >60  --  >60 >60  GFRAA >60  < > >60  --  >60 >60  PROT 6.6  --   --   --  6.7 6.6  ALBUMIN 3.5  --   --   --  3.8 3.5  AST 25  --   --   --  21 27  ALT 15  --   --   --  25 20  ALKPHOS 77  --   --   --  52 56  BILITOT 0.5  --   --   --  0.5 0.6  < > = values in this interval not displayed.   ASSESSMENT & PLAN:   Malignant neoplasm of right upper lobe of lung ([place])  # STAGE IV [tiny focus of brain met] Adenocarcinoma T2N2; right upper lobe; s/p  cycle #2 consolidation taxol alone [carbo held sec to breast abcess]. No clinical signs of progression. AUG 1st 2017-PET- partial response. No clinical progression. Recommend  CT in 6 weeks.   # Brain metastasis- MRI of the brain- three new mets.  S/P RT finished on9/25. Repeat MRI brain.   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   # Oral thrush- present sec to steroids- recommend Nystatin prn. Improved.    # URI/ ear fullness- cerumen impaction. Recommend home kit.   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 lyrica & hydrocodone; fentanyl patch- better.   # follow up in 7 weeks/ CT/MRI in 6 weeks.    Cammie Sickle, MD 03/25/2016 6:16 PM

## 2016-03-29 ENCOUNTER — Ambulatory Visit: Payer: BLUE CROSS/BLUE SHIELD | Attending: Internal Medicine

## 2016-03-29 VITALS — BP 112/88 | HR 125

## 2016-03-29 DIAGNOSIS — M6281 Muscle weakness (generalized): Secondary | ICD-10-CM

## 2016-03-29 DIAGNOSIS — R2681 Unsteadiness on feet: Secondary | ICD-10-CM | POA: Diagnosis present

## 2016-03-29 NOTE — Therapy (Addendum)
[place] MAIN [facility] SERVICES [address] [place], [place], [address] Phone: [phone]   Fax:  [phone]  Physical Therapy Treatment  Patient Details  Name: Tiffany Erickson MRN: [id number] Date of Birth: 06-13-1970 Referring Provider: Cammie Sickle, MD  Encounter Date: 03/29/2016      PT End of Session - 03/29/16 1644    Visit Number 1   Number of Visits 12   Date for PT Re-Evaluation 05/10/16   Authorization Type no g code   PT Start Time 1430   PT Stop Time 1530   PT Time Calculation (min) 60 min   Activity Tolerance Patient tolerated treatment well   Behavior During Therapy [facility] for tasks assessed/performed      Past Medical History:  Diagnosis Date  . Anxiety   . Broken ribs 05/2015   5 ribs  . Chronic pancreatitis ([place])   . Concussion 06/01/2015   S/P fall-PT STATES IN PHONE INTERVIEW THAT SHE IS UNSURE IF SHE HAD A CONCUSSION OR NOT  . Frequent UTI    "sometimes" (06/01/2015)  . GERD (gastroesophageal reflux disease)   . Lung cancer ([place]) 07/13/15   METASTATIC ADENOCARCINOMA OF THE LUNG  . Lung mass    right side noted 06/01/2015  . Multiple fractures of ribs of right side 06/01/2015   "5" S/P fall  LEFT SIDE  . Shortness of breath dyspnea    5 rib fx right side    Past Surgical History:  Procedure Laterality Date  . ENDOBRONCHIAL ULTRASOUND N/A 07/13/2015   Procedure: ENDOBRONCHIAL ULTRASOUND;  Surgeon: Laverle Hobby, MD;  Location: [facility] ORS;  Service: Pulmonary;  Laterality: N/A;  . ESOPHAGOGASTRODUODENOSCOPY    . EYE MUSCLE SURGERY Left ~ 1979  . MYRINGOTOMY WITH TUBE PLACEMENT Bilateral   . PORTACATH PLACEMENT Left 07/27/2015   Procedure: INSERTION PORT-A-CATH;  Surgeon: [name], MD;  Location: [facility] ORS;  Service: General;  Laterality: Left;  . TONSILLECTOMY      Vitals:   03/29/16 1446  BP: 112/88  Pulse: (!) 125        Subjective Assessment - 03/29/16 1446    Subjective Patient  states increased wekaness from inactivity and DOE. Patient reports intermittant back pain and soreness with radiating symptoms down the posterior/lateral aspect of the leg. Patient avoids bending and lifting activities and states she regulates her speed when performing ADLS. Patient reports she requires a sititng rest break post performing a flight of stairs secondary to fatigue. States she is unable to walk longer than 10 minutes without taking a sitting rest break.    Pertinent History Hx of CA with mass along right lung.    Limitations Lifting;Standing;Walking   How long can you stand comfortably? 10 min    How long can you walk comfortably? 10 min   Patient Stated Goals Improve strength and endurance of the LE and cardiovascular symptoms   Currently in Pain? Yes   Pain Score 4    Pain Location Back  neck   Pain Orientation Right;Left   Pain Descriptors / Indicators Aching;Sore;Radiating;Nagging   Pain Onset More than a month ago   Pain Frequency Intermittent            [facility] PT Assessment - 03/29/16 1440      Assessment   Medical Diagnosis M62.81 (ICD-10-CM) - Weakness of both lower extremities   Referring Provider Cammie Sickle, MD   Onset Date/Surgical Date 05/31/15   Hand Dominance Right   Next  MD Visit unknown   Prior Therapy no     Balance Screen   Has the patient fallen in the past 6 months No   Has the patient had a decrease in activity level because of a fear of falling?  Yes   Is the patient reluctant to leave their home because of a fear of falling?  No     Home Environment   Living Environment Private residence   Living Arrangements Alone   Available Help at Discharge Family   Type of [place] to enter   Entrance Stairs-Number of Steps 1   Entrance Stairs-Rails None   Home Layout Two level   Alternate Level Stairs-Number of Steps 15   Alternate Level Stairs-Rails Can reach both     Prior Function   Level of Independence  Independent   Vocation Unemployed   [employer] work, paper work when [employer],     Cognition   Overall Cognitive Status Within Functional Limits for tasks assessed     ROM / Strength   AROM / PROM / Strength Strength     Strength   Strength Assessment Site Hip;Knee;Ankle   Right/Left Hip Right;Left   Right Hip Flexion 5/5   Right Hip ABduction 4/5   Right Hip ADduction 4/5   Left Hip Flexion 5/5   Left Hip ABduction 4/5   Left Hip ADduction 4/5   Right/Left Knee Right;Left   Right Knee Flexion 4/5   Right Knee Extension 5/5   Left Knee Flexion 4/5   Left Knee Extension 5/5   Right/Left Ankle Right;Left   Right Ankle Dorsiflexion 4+/5   Left Ankle Dorsiflexion 4+/5     Ambulation/Gait   Gait Comments Decreased trunk and arm swing throughout gait cycle     6 Minute Walk- Baseline   6 Minute Walk- Baseline yes   BP (mmHg) 124/86   HR (bpm) 125   02 Sat (%RA) 94 %   Modified Borg Scale for Dyspnea 2- Mild shortness of breath     6 Minute walk- Post Test   6 Minute Walk Post Test yes   HR (bpm) 142   02 Sat (%RA) 91 %   Modified Borg Scale for Dyspnea 7- Severe shortness of breath or very hard breathing     6 minute walk test results    Aerobic Endurance Distance Walked 965     Standardized Balance Assessment   Standardized Balance Assessment Five Times Sit to Stand   Five times sit to stand comments  26.7   10 Meter Walk .833     Outcome Measures: LEFS: 19/80  TREATMENT: Amb -- 900 ft with cueing on steppage pattern and appropriate mechanics Sit to stands with cueing on speed of movement during exercise  -- x 5 Educated on performing and progressing walking program -- x3 min          PT Education - 03/29/16 1643    Education provided Yes   Education Details HEP: walking program, sit to stands   Person(s) Educated Patient   Methods Explanation;Demonstration   Comprehension Verbalized understanding;Returned  demonstration             PT Long Term Goals - 03/29/16 1651      PT LONG TERM GOAL #1   Title Pt will be independent with HEP focused on imroving strength and decreasing fall risk to return to prior level of function   Baseline dependent  with exercise progression and performance   Time 6   Period Weeks   Status New     PT LONG TERM GOAL #2   Title Pt will increase 5mwalk time by 1558fto demonstrate significant improvement in muscular/cardiovascular endurance   Baseline [id number] Time 6   Period Weeks   Status New     PT LONG TERM GOAL #3   Title Pt will score over 50m/32mn the 52m 86m test to demonstrate significant decrease in fall risk   Baseline .82 m/s   Time 6   Period Weeks     PT LONG TERM GOAL #4   Title Patient will score >32 on the LEFS to demonstrate significant improvement in perceived LE function and greater ability to lift objects from the ground   Baseline LEFS: 19/80   Time 6   Period Weeks   Status New     PT LONG TERM GOAL #5   Title Patient will score under 16 seconds on the 5xSTS to demonstrate improved functional strength and decreased fall risk   Baseline 5xSTS: 21 secs   Time 6   Period Weeks   Status New               Plan - 03/29/16 1646    Clinical Impression Statement Pt is a 44 yo95ight hand dominant female presenting with increased fall risk and decreased muscular/cardiovascular endurance/strength indicated by poor scoring on the 52mW 50m, 5xSTS, and 6min w65m test. Patient will benefit from further skilled therapy focused on improving these current limitations to return to prior level of function.    Rehab Potential Fair   Clinical Impairments Affecting Rehab Potential (+) highly motivated (-) Cancer   PT Frequency 2x / week   PT Duration 6 weeks   PT Treatment/Interventions Gait training;Neuromuscular re-education;Electrical Stimulation;Cryotherapy;Ultrasound;Moist Heat;Stair training;Therapeutic activities;Therapeutic  exercise;Balance training;Manual techniques   PT Next Visit Plan Progressive strengthening and muscular endurance   PT Home Exercise Plan sit to stands, walking program      Patient will benefit from skilled therapeutic intervention in order to improve the following deficits and impairments:  Decreased balance, Difficulty walking, Decreased strength, Decreased endurance, Decreased activity tolerance, Decreased coordination, Increased muscle spasms, Abnormal gait, Pain  Visit Diagnosis: Muscle weakness (generalized)  Unsteadiness on feet     Problem List Patient Active Problem List   Diagnosis Date Noted  . Brain metastasis (HCC) 07[place]/2017  . Hypokalemia 11/25/2015  . Encounter for antineoplastic chemotherapy 11/24/2015  . Chronic pancreatitis (HCC) 06[place]/2017  . Drug-induced neutropenia (HCC) 06[place]/2017  . Malignant neoplasm of right upper lobe of lung (HCC) 02[place]/2017  . Dyspnea   . Fall 06/02/2015  . Multiple rib fractures 06/01/2015    [place] Blythe StanfordT 03/29/2016, 5:13 PM  [facility] [id number] [phone]  [phone] Alyssamae J HKELCI [id number] Birth: 12/7/19December 26, 1972

## 2016-04-05 ENCOUNTER — Ambulatory Visit: Payer: BLUE CROSS/BLUE SHIELD

## 2016-04-07 ENCOUNTER — Ambulatory Visit: Payer: BLUE CROSS/BLUE SHIELD

## 2016-04-12 ENCOUNTER — Inpatient Hospital Stay: Payer: BLUE CROSS/BLUE SHIELD | Attending: Internal Medicine

## 2016-04-12 ENCOUNTER — Ambulatory Visit: Payer: BLUE CROSS/BLUE SHIELD

## 2016-04-19 ENCOUNTER — Ambulatory Visit: Payer: BLUE CROSS/BLUE SHIELD

## 2016-05-04 ENCOUNTER — Ambulatory Visit: Admission: RE | Admit: 2016-05-04 | Payer: Medicaid Other | Source: Ambulatory Visit

## 2016-05-12 ENCOUNTER — Other Ambulatory Visit: Payer: BLUE CROSS/BLUE SHIELD

## 2016-05-12 ENCOUNTER — Ambulatory Visit: Payer: BLUE CROSS/BLUE SHIELD | Admitting: Internal Medicine

## 2016-05-17 ENCOUNTER — Ambulatory Visit
Admission: RE | Admit: 2016-05-17 | Discharge: 2016-05-17 | Disposition: A | Discharge status: Home / Self Care | Payer: Medicaid Other | Source: Ambulatory Visit | Attending: Internal Medicine | Admitting: Internal Medicine

## 2016-05-17 DIAGNOSIS — C3411 Malignant neoplasm of upper lobe, right bronchus or lung: Secondary | ICD-10-CM

## 2016-05-17 DIAGNOSIS — C7931 Secondary malignant neoplasm of brain: Secondary | ICD-10-CM

## 2016-05-17 LAB — POCT I-STAT CREATININE: CREATININE: 0.9 mg/dL (ref 0.44–1.00)

## 2016-05-17 MED ORDER — IOPAMIDOL (ISOVUE-300) INJECTION 61%
75.0000 mL | Freq: Once | INTRAVENOUS | Status: AC | PRN
  Administered 2016-05-17: 75 mL via INTRAVENOUS

## 2016-05-17 MED ORDER — GADOBENATE DIMEGLUMINE 529 MG/ML IV SOLN
15.0000 mL | Freq: Once | INTRAVENOUS | Status: AC | PRN
  Administered 2016-05-17: 14 mL via INTRAVENOUS

## 2016-05-19 ENCOUNTER — Inpatient Hospital Stay: Payer: Medicaid Other

## 2016-05-19 ENCOUNTER — Inpatient Hospital Stay (HOSPITAL_BASED_OUTPATIENT_CLINIC_OR_DEPARTMENT_OTHER): Payer: Medicaid Other | Admitting: Internal Medicine

## 2016-05-19 ENCOUNTER — Inpatient Hospital Stay: Payer: Medicaid Other | Attending: Internal Medicine

## 2016-05-19 ENCOUNTER — Ambulatory Visit
Admission: RE | Admit: 2016-05-19 | Discharge: 2016-05-19 | Disposition: A | Discharge status: Home / Self Care | Payer: Medicaid Other | Source: Ambulatory Visit | Attending: Radiation Oncology | Admitting: Radiation Oncology

## 2016-05-19 VITALS — BP 94/67 | HR 132 | Temp 96.8°F | Wt 128.1 lb

## 2016-05-19 DIAGNOSIS — R110 Nausea: Secondary | ICD-10-CM

## 2016-05-19 DIAGNOSIS — G629 Polyneuropathy, unspecified: Secondary | ICD-10-CM | POA: Insufficient documentation

## 2016-05-19 DIAGNOSIS — R251 Tremor, unspecified: Secondary | ICD-10-CM | POA: Diagnosis not present

## 2016-05-19 DIAGNOSIS — Z87440 Personal history of urinary (tract) infections: Secondary | ICD-10-CM | POA: Diagnosis not present

## 2016-05-19 DIAGNOSIS — L598 Other specified disorders of the skin and subcutaneous tissue related to radiation: Secondary | ICD-10-CM | POA: Diagnosis not present

## 2016-05-19 DIAGNOSIS — R630 Anorexia: Secondary | ICD-10-CM | POA: Insufficient documentation

## 2016-05-19 DIAGNOSIS — F419 Anxiety disorder, unspecified: Secondary | ICD-10-CM | POA: Insufficient documentation

## 2016-05-19 DIAGNOSIS — K219 Gastro-esophageal reflux disease without esophagitis: Secondary | ICD-10-CM | POA: Insufficient documentation

## 2016-05-19 DIAGNOSIS — R5382 Chronic fatigue, unspecified: Secondary | ICD-10-CM

## 2016-05-19 DIAGNOSIS — C3411 Malignant neoplasm of upper lobe, right bronchus or lung: Secondary | ICD-10-CM

## 2016-05-19 DIAGNOSIS — R531 Weakness: Secondary | ICD-10-CM | POA: Insufficient documentation

## 2016-05-19 DIAGNOSIS — F19939 Other psychoactive substance use, unspecified with withdrawal, unspecified: Secondary | ICD-10-CM | POA: Insufficient documentation

## 2016-05-19 DIAGNOSIS — H539 Unspecified visual disturbance: Secondary | ICD-10-CM | POA: Insufficient documentation

## 2016-05-19 DIAGNOSIS — Z923 Personal history of irradiation: Secondary | ICD-10-CM | POA: Insufficient documentation

## 2016-05-19 DIAGNOSIS — Z8781 Personal history of (healed) traumatic fracture: Secondary | ICD-10-CM | POA: Insufficient documentation

## 2016-05-19 DIAGNOSIS — R5383 Other fatigue: Secondary | ICD-10-CM | POA: Diagnosis not present

## 2016-05-19 DIAGNOSIS — R634 Abnormal weight loss: Secondary | ICD-10-CM | POA: Diagnosis not present

## 2016-05-19 DIAGNOSIS — R0602 Shortness of breath: Secondary | ICD-10-CM | POA: Diagnosis not present

## 2016-05-19 DIAGNOSIS — Z79899 Other long term (current) drug therapy: Secondary | ICD-10-CM | POA: Insufficient documentation

## 2016-05-19 DIAGNOSIS — F17210 Nicotine dependence, cigarettes, uncomplicated: Secondary | ICD-10-CM | POA: Insufficient documentation

## 2016-05-19 DIAGNOSIS — Z9221 Personal history of antineoplastic chemotherapy: Secondary | ICD-10-CM | POA: Insufficient documentation

## 2016-05-19 DIAGNOSIS — Z800 Family history of malignant neoplasm of digestive organs: Secondary | ICD-10-CM | POA: Diagnosis not present

## 2016-05-19 DIAGNOSIS — K861 Other chronic pancreatitis: Secondary | ICD-10-CM | POA: Insufficient documentation

## 2016-05-19 DIAGNOSIS — E876 Hypokalemia: Secondary | ICD-10-CM

## 2016-05-19 DIAGNOSIS — C771 Secondary and unspecified malignant neoplasm of intrathoracic lymph nodes: Secondary | ICD-10-CM

## 2016-05-19 DIAGNOSIS — C7931 Secondary malignant neoplasm of brain: Secondary | ICD-10-CM | POA: Diagnosis not present

## 2016-05-19 DIAGNOSIS — R112 Nausea with vomiting, unspecified: Secondary | ICD-10-CM | POA: Diagnosis not present

## 2016-05-19 LAB — CBC WITH DIFFERENTIAL/PLATELET
BASOS ABS: 0 10*3/uL (ref 0–0.1)
BASOS PCT: 0 %
EOS ABS: 0.1 10*3/uL (ref 0–0.7)
Eosinophils Relative: 1 %
HCT: 44.3 % (ref 35.0–47.0)
HEMOGLOBIN: 14.9 g/dL (ref 12.0–16.0)
LYMPHS ABS: 1 10*3/uL (ref 1.0–3.6)
Lymphocytes Relative: 8 %
MCH: 32 pg (ref 26.0–34.0)
MCHC: 33.7 g/dL (ref 32.0–36.0)
MCV: 95.2 fL (ref 80.0–100.0)
Monocytes Absolute: 0.6 10*3/uL (ref 0.2–0.9)
Monocytes Relative: 5 %
NEUTROS PCT: 86 %
Neutro Abs: 10.4 10*3/uL — ABNORMAL HIGH (ref 1.4–6.5)
Platelets: 353 10*3/uL (ref 150–440)
RBC: 4.66 MIL/uL (ref 3.80–5.20)
RDW: 13.2 % (ref 11.5–14.5)
WBC: 12.1 10*3/uL — AB (ref 3.6–11.0)

## 2016-05-19 LAB — COMPREHENSIVE METABOLIC PANEL
ALT: 11 U/L — AB (ref 14–54)
AST: 27 U/L (ref 15–41)
Albumin: 4.1 g/dL (ref 3.5–5.0)
Alkaline Phosphatase: 45 U/L (ref 38–126)
Anion gap: 13 (ref 5–15)
BUN: <5 mg/dL — ABNORMAL LOW (ref 6–20)
CHLORIDE: 95 mmol/L — AB (ref 101–111)
CO2: 26 mmol/L (ref 22–32)
CREATININE: 0.95 mg/dL (ref 0.44–1.00)
Calcium: 9.5 mg/dL (ref 8.9–10.3)
Glucose, Bld: 146 mg/dL — ABNORMAL HIGH (ref 65–99)
Potassium: 2.6 mmol/L — CL (ref 3.5–5.1)
Sodium: 134 mmol/L — ABNORMAL LOW (ref 135–145)
TOTAL PROTEIN: 7.2 g/dL (ref 6.5–8.1)
Total Bilirubin: 0.6 mg/dL (ref 0.3–1.2)

## 2016-05-19 MED ORDER — FENTANYL 50 MCG/HR TD PT72: 50.0000 ug | MEDICATED_PATCH | TRANSDERMAL | 0 refills | Status: DC

## 2016-05-19 MED ORDER — SODIUM CHLORIDE 0.9 % IV SOLN
40.0000 meq | Freq: Once | INTRAVENOUS | Status: AC
  Administered 2016-05-19: 40 meq via INTRAVENOUS
  Filled 2016-05-19: qty 20

## 2016-05-19 MED ORDER — HYDROCODONE-ACETAMINOPHEN 5-325 MG PO TABS: 1.0000 | ORAL_TABLET | Freq: Three times a day (TID) | ORAL | 0 refills | Status: AC | PRN

## 2016-05-19 MED ORDER — DEXAMETHASONE 4 MG PO TABS: 4.0000 mg | ORAL_TABLET | Freq: Every day | ORAL | 0 refills | Status: DC

## 2016-05-19 MED ORDER — HEPARIN SOD (PORK) LOCK FLUSH 100 UNIT/ML IV SOLN
500.0000 [IU] | Freq: Once | INTRAVENOUS | Status: AC | PRN
  Administered 2016-05-19: 500 [IU]
  Filled 2016-05-19: qty 5

## 2016-05-19 MED ORDER — SODIUM CHLORIDE 0.9 % IJ SOLN
10.0000 mL | INTRAMUSCULAR | Status: DC | PRN
  Administered 2016-05-19: 10 mL
  Filled 2016-05-19: qty 10

## 2016-05-19 MED ORDER — ALPRAZOLAM 0.5 MG PO TABS: 0.5000 mg | ORAL_TABLET | Freq: Three times a day (TID) | ORAL | 0 refills | Status: DC | PRN

## 2016-05-19 MED ORDER — SODIUM CHLORIDE 0.9 % IV SOLN
Freq: Once | INTRAVENOUS | Status: AC
  Administered 2016-05-19: 12:00:00 via INTRAVENOUS
  Filled 2016-05-19: qty 1000

## 2016-05-19 MED ORDER — SODIUM CHLORIDE 0.9 % IV SOLN: 40.0000 meq | Freq: Once | INTRAVENOUS | Status: DC

## 2016-05-19 NOTE — Progress Notes (Signed)
[place], [place] Tiffany Erickson, M.D., M.P.H. Radiation Oncology  Radiation Oncology Follow-up Note  Name: Tiffany Erickson. [place] Date: 05/19/2016 MRN: [id number] DOB: 1970-08-02  Referring Physician:  Charlaine Dalton, M.D.   Chief Complaint:  Tiffany Erickson is a 45 yo white female with widely metastic adenocarcinoma of lung and progression of disease within the right inferior occipital lobe who was referred for evaluation and disussion regarding the potential role of palliative partial brain radiation therapy.  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  Briefly, her significant oncologic history dates back to February 2017 when she was evaluated in the emergency room after falling off a ladder trying to change a smoke detector.  During the course of the the workup she was found to have a right upper lobe lung mass.  CT confirmed the presence of the right upper lobe mass as well as the presence of paratracheal lymphadenopathy.  She underwent EBUS with biopsy of the paratracheal node yielding a diagnosis of adenocarcinoma consistent with a lung primary.  Between 08/11/2015 and 09/07/2015 she was treated to the rt lung with 3D conformal radiation therapy to a total dose of 4000 cGy in 20 fractions.  In addition, she received IMRT to a solitary brain lesion to a total dose of 2400 cGy in 4 fractions between 09/01/2015 and 09/04/2015.  She returned for a boost to the right lung tumor in which she received an additional 2600 cGy in 13 fractions between 09/23/2015 and 10/12/2015.  Thus, in total she received 6600 cGy in 33 fractions to the right lung primary tumor.  She was started on consolidative chemotherapy with carboplatin and taxol and was doing  relatively well with a PET scan revealing a good partial response.  However, an MRI unfortunately revealed 3 new metastatic lesions for which she was referred for a palliative course of whole brain radiation therapy.  At the time she complained of mild headaches but exhibited no other neurologic symptoms.  The largest metastatic focus measured 9 mm and was present near the vertex.  Between 02/04/2016 and 02/29/2016 she received 3060 cGy in 17 fractions to the whole brain.  Once again Tiffany Erickson was doing fairly well for a short period of time until recently when she presented complaining of a tremor, generalized weakness, reduced appetite, fatigue, visual changes and hallucinations.  On 05/17/2016 she underwent an MRI of the brain that revealed a small new 5-6 mm metastasis in the inferior right occipital lobe with minimal associated vasogenic edema and no mass effect.  A right superior frontal gyrus metastasis with increased surrounding edema was noted but was felt to be secondary to pseudo progression from prior treatment rather than metastasis enlargement.  As a result of the aforementioned findings, Tiffany Erickson was referred to me today for evaluation and discussion regarding the role of palliative partial brain radiation therapy.    Past Medical History: Adenocarcinoma of lung, metastatic Brain metastasis secondary to metastatic lung cancer Anxiety Fractured ribs (5) Chronic Pancreatitis Concussion Frequent UTI GERD  Past Surgical History: Endobronchial ultrasound-07/13/2015. Esophagogastroduodenoscopy Left eye muscle surgery-1979 Myringotomy with tube placement, bilateral. Port-a-cath placement-07/27/2015 Tonsillectomy  Social History: Not married Current smoker: 0.5 ppd x 28 years.  No history of smokeless tobacco Alcohol  Use: 8.4 oz/week.    Family History: Maternal grandfather had colon cancer Maternal grandfather has CAD Maternal grandmother has pancreatic cancer.  Medications: Xanax  1.0 mg TID prn Decadron 4 mg BID Duragesic Patch 50 mcg/hr Diflucan 100 mg daily Norco/Vicodin 5-325 mg TID prn EMLA cream Reglan 10 mg every 8 hrs prn Zofran 8 mg at bedtime Protonix 20 mg at bedtime Mirapex 0.5 mg at bedtime Lyrica 75 mg BID Compazine 10 mg prn Ambien 5 mg at bedtime prn  Allergies: Sulfa  Review of Systems: Positive for alopecia, fatigue, shortness of breath, reduction in appetite, decrease in po and caloric intake, tremor, visual changes, mild headaches, generalized weakness, occasional episodes of nausea and vomiting, and hallucinations.  Otherwise, she denies dysuria, diarrhea, vaginal bleeding, seizures, extremity edema, hemoptysis, cardiac palpitations, change in sense of taste, and stridor.    Physical Examination: Tiffany Erickson is an ill appearing, cachectic white female who is lying in her hospital bed.  She is accompanied today by her mother and 2 aunts. Oral cavity/Oropharynx: No evidence of thrush.  Mucous membranes are moist.  Neurologic: Cranial nerves II-XII are intact.  Gait not tested.  Sensory exam intact. No mucosal lesions. Lungs: Clear Heart: RRR. Ext: No evidence of lymphedema. Neck: supple without thyromegaly.  Assessment/Plan: Tiffany Erickson is an unfortunate 45 yo WF with adenocarcinoma of lung metastatic to brain, recurrent. Today prior to consulting with Ms. Sonnier I had an opportunity to review her previously irradiated brain fields.  Her initial brain treatment was delivered in 08/2015 for a total dose of 2400 cGy in 4 fractions with IMRT to the frontal region.  She next received whole brain radiation therapy in 01/2016 to a total dose of 3060 cGy in 17 fractions.  Her MRI from 05/17/2016 reveal a new 5-6 mm metastasis to the inferior right occipital lobe.  I believe that I can irradiate the new lesion utilizing hypofractionated IMRT without overlapping the initial IMRT brain field.  Clearly there will complete re-treatment from the whole brain  field but I am hopeful that by minimizing the irradiated volume that the incidence of complications can be kept relatively low.  Nevertheless, she does understand that the fact that she was previously treated to the whole brain does place her at an increased risk of complications.  I reviewed with Ms. Clover, her mother and aunts the rational for the use of palliative radiation therapy, the logistics of radiation therapy as well as the potential risks and complications.  Throughout my discussion today with Bryttany and her family members they were given an opportunitity to ask questions that appeared to have been answered to their satisfaction. Ms. Underberg will return on Monday 05/23/2016 for CT simulation and 3D treatment planning.

## 2016-05-19 NOTE — Progress Notes (Signed)
[facility] CONSULT NOTE  Patient Care Team: Elgie Collard, MD as PCP - General (Obstetrics and Gynecology) Christene Lye, MD (General Surgery)  CHIEF COMPLAINTS/PURPOSE OF CONSULTATION:   Oncology History   # FEB 2017- ADENOCARCINOMA RUL [T2N2; STAGE III Bronch/FNA- Right paratracheal LN;Dr.Ram]. PET- no distant mets; March 3rd START CARBO-TAXOL Weekly +RT [until may 5th?]; June 21st 2017- last dose of consolidation carbo-taxol; Jan 12 2016- PET- Improved on RUL mass/LN  # FEB 2017- MRI Brain 51m focus of met [Right frontal]; Jan 12 2016- 3 new brain mets [start WBRT 8/31]; DEC 2017- New occipital met-~567m Refer to RT  # left breast abscess; Aug 2017- PET ? Right Breast abcess  # "Chronic pancreatitis"/ smoker [15ppt]   # MOLECULAR STUDIES: ALK-NEG;ROS-1-NEG     Malignant neoplasm of right upper lobe of lung ([facility]  07/30/2015 Initial Diagnosis    Malignant neoplasm of right upper lobe of lung (HCC)        HISTORY OF PRESENTING ILLNESS:  Tiffany Erickson [age].o.  female  history of stage IV lung cancer adenocarcinoma currently Status post consolidation chemotherapy-Status post whole brain radiation is here for follow-up/to review the results of the CT scan and brain MRI.  Patient continues to feel poorly. She has lost a lot of weight. Poor appetite. She continues to feel weak.   neuropathy is stable ; currently on fentanyl patch. She currently off dexamethasone.  Patient Chronic mild shortness of breath chronic fatigue. Denies any fevers or chills.  No cough or hemoptysis.  Continues to chronic abdominal discomfort; Chronic nausea. She complains of bilateral hand tremors.   ROS: A complete 10 point review of system is done which is negative except mentioned above in history of present illness  MEDICAL HISTORY:  Past Medical History:  Diagnosis Date  . Anxiety   . Broken ribs 05/2015   5 ribs  . Chronic pancreatitis ([facility]  . Concussion 06/01/2015   S/P fall-PT STATES IN PHONE INTERVIEW THAT SHE IS UNSURE IF SHE HAD A CONCUSSION OR NOT  . Frequent UTI    "sometimes" (06/01/2015)  . GERD (gastroesophageal reflux disease)   . Lung cancer ([facility]17   METASTATIC ADENOCARCINOMA OF THE LUNG  . Lung mass    right side noted 06/01/2015  . Multiple fractures of ribs of right side 06/01/2015   "5" S/P fall  LEFT SIDE  . Shortness of breath dyspnea    5 rib fx right side    SURGICAL HISTORY: Past Surgical History:  Procedure Laterality Date  . ENDOBRONCHIAL ULTRASOUND N/A 07/13/2015   Procedure: ENDOBRONCHIAL ULTRASOUND;  Surgeon: PrLaverle HobbyMD;  Location: [facility] ORS;  Service: Pulmonary;  Laterality: N/A;  . ESOPHAGOGASTRODUODENOSCOPY    . EYE MUSCLE SURGERY Left ~ 1979  . MYRINGOTOMY WITH TUBE PLACEMENT Bilateral   . PORTACATH PLACEMENT Left 07/27/2015   Procedure: INSERTION PORT-A-CATH;  Surgeon: TiNestor LewandowskyMD;  Location: [facility] ORS;  Service: General;  Laterality: Left;  . TONSILLECTOMY      SOCIAL HISTORY: Social History   Social History  . Marital status: Single    Spouse name: N/A  . Number of children: N/A  . Years of education: N/A   Occupational History  . Not on file.   Social History Main Topics  . Smoking status: Current Every Day Smoker    Packs/day: 0.50    Years: 28.00    Types: Cigarettes  . Smokeless tobacco: Never Used  . Alcohol use 8.4 oz/week  2 Glasses of wine, 12 Cans of beer per week     Comment: 06/01/2015 "drink probably 4 times/wk; either ;4 beers or 2 glasses of wine when I do drink"  . Drug use: No  . Sexual activity: Not Currently   Other Topics Concern  . Not on file   Social History Narrative  . No narrative on file    FAMILY HISTORY: Family History  Problem Relation Age of Onset  . Colon cancer Maternal Grandfather   . CAD Maternal Grandfather   . Pancreatic cancer Maternal Grandmother     ALLERGIES:  is allergic to sulfa antibiotics.  MEDICATIONS:  Current  Outpatient Prescriptions  Medication Sig Dispense Refill  . albuterol (PROVENTIL HFA;VENTOLIN HFA) 108 (90 Base) MCG/ACT inhaler Inhale 2 puffs into the lungs every 6 (six) hours as needed for wheezing or shortness of breath. 1 Inhaler 2  . ALPRAZolam (XANAX) 0.5 MG tablet Take 1 tablet (0.5 mg total) by mouth 3 (three) times daily as needed for anxiety. 45 tablet 0  . dexamethasone (DECADRON) 4 MG tablet Take 1 tablet (4 mg total) by mouth daily. 30 tablet 0  . fentaNYL (DURAGESIC - DOSED MCG/HR) 50 MCG/HR Place 1 patch (50 mcg total) onto the skin every 3 (three) days. 5 patch 0  . HYDROcodone-acetaminophen (NORCO/VICODIN) 5-325 MG tablet Take 1 tablet by mouth every 8 (eight) hours as needed for moderate pain. 90 tablet 0  . lidocaine-prilocaine (EMLA) cream Apply 1 application topically as needed. 30 g 6  . metoCLOPramide (REGLAN) 10 MG tablet Take 1 tablet (10 mg total) by mouth every 8 (eight) hours as needed for nausea or vomiting. 10 tablet 0  . nystatin (MYCOSTATIN) 100000 UNIT/ML suspension Take 5 mLs (500,000 Units total) by mouth 4 (four) times daily. 60 mL 0  . ondansetron (ZOFRAN) 8 MG tablet Take 1 tablet (8 mg total) by mouth every 8 (eight) hours as needed for nausea or vomiting (start 3 days; after chemo). 40 tablet 3  . pantoprazole (PROTONIX) 20 MG tablet Take 20 mg by mouth at bedtime.     . pregabalin (LYRICA) 75 MG capsule Take 1 capsule (75 mg total) by mouth 2 (two) times daily. 60 capsule 3  . promethazine (PHENERGAN) 25 MG tablet Take 25 mg by mouth every 8 (eight) hours as needed for nausea or vomiting.    Marland Kitchen zolpidem (AMBIEN) 5 MG tablet Take 5 mg by mouth at bedtime as needed for sleep.     No current facility-administered medications for this visit.    Facility-Administered Medications Ordered in Other Visits  Medication Dose Route Frequency Provider Last Rate Last Dose  . sodium chloride flush (NS) 0.9 % injection 10 mL  10 mL Intravenous PRN Cammie Sickle,  MD   10 mL at 03/02/16 1107      .  PHYSICAL EXAMINATION: ECOG PERFORMANCE STATUS: 0 - Asymptomatic  Vitals:   05/19/16 0937  BP: 94/67  Pulse: (!) 132  Temp: (!) 96.8 F (36 C)   Filed Weights   05/19/16 0937  Weight: 128 lb 2 oz (58.1 kg)    GENERAL: Thin build; cachectic-appearing female patient Alert, no distress and comfortable.She Accompanied by her mother/other family. Patient is in a wheelchair. EYES: no pallor or icterus OROPHARYNX: No thrush significantly improved. no ulceration;  NECK: supple, no masses felt LYMPH:  no palpable lymphadenopathy in the cervical, axillary or inguinal regions LUNGS: clear to auscultation and  No wheeze or crackles HEART/CVS: regular rate &  rhythm and no murmurs; No lower extremity edema ABDOMEN: abdomen soft, non-tender and normal bowel sounds Musculoskeletal:no cyanosis of digits and no clubbing  PSYCH: alert & oriented x 3 with fluent speech; anxious NEURO: no focal motor/sensory deficits SKIN: 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 mediport- no signs of infection.   LABORATORY DATA:  I have reviewed the data as listed Lab Results  Component Value Date   WBC 12.1 (H) 05/19/2016   HGB 14.9 05/19/2016   HCT 44.3 05/19/2016   MCV 95.2 05/19/2016   PLT 353 05/19/2016    Recent Labs  02/24/16 1345 03/02/16 1050 05/17/16 1050 05/19/16 0850  NA 134* 134*  --  134*  K 3.3* 3.4*  --  2.6*  CL 101 97*  --  95*  CO2 24 24  --  26  GLUCOSE 125* 147*  --  146*  BUN 19 11  --  <5*  CREATININE 0.55 0.58 0.90 0.95  CALCIUM 8.9 9.0  --  9.5  GFRNONAA >60 >60  --  >60  GFRAA >60 >60  --  >60  PROT 6.7 6.6  --  7.2  ALBUMIN 3.8 3.5  --  4.1  AST 21 27  --  27  ALT 25 20  --  11*  ALKPHOS 52 56  --  45  BILITOT 0.5 0.6  --  0.6     ASSESSMENT & PLAN:   Malignant neoplasm of right upper lobe of lung (HCC)  # STAGE IV [tiny focus of brain met] Adenocarcinoma T2N2; right upper lobe; s/p  cycle #2 consolidation  taxol alone [carbo held sec to breast abcess]. DEC 2017- Chest CT- Partial response. Clinically patient noted to have significant decline in the performance status.   # Brain metastasis- New brain mets- recommend evaluation with RT.   # Bil tremors- multifactorial; anxiety/brain metastases/withdrawal from steroids. Recommend starting dexamethasone 4 mg once a day. Prescription given.  # Hypokalemia- severe- 2.7; recommend K/IVF.    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 hydrocodone; fentanyl patch- better. Prescription given.  # Follow-up in 3 weeks with labs/electrolytes.  # Overall poor prognosis was discussed the patient and family; especially the context of declining performance status.     Cammie Sickle, MD 05/20/2016 8:04 AM

## 2016-05-19 NOTE — Assessment & Plan Note (Addendum)
#  STAGE IV Chucky May focus of brain met] Adenocarcinoma T2N2; right upper lobe; s/p  cycle #2 consolidation taxol alone [carbo held sec to breast abcess]. DEC 2017- Chest CT- Partial response. Clinically patient noted to have significant decline in the performance status.   # Brain metastasis- New brain mets- recommend evaluation with RT.   # Bil tremors- multifactorial; anxiety/brain metastases/withdrawal from steroids. Recommend starting dexamethasone 4 mg once a day. Prescription given.  # Hypokalemia- severe- 2.7; recommend K/IVF.    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 hydrocodone; fentanyl patch- better. Prescription given.  # Follow-up in 3 weeks with labs/electrolytes.  # Overall poor prognosis was discussed the patient and family; especially the context of declining performance status.

## 2016-05-19 NOTE — Progress Notes (Signed)
[place] in cancer center called critcal value for potassium of 2.6 (called on patient: Tiffany Erickson:  Dob:  12/05/1970).   Read back process performed with Maudie Mercury.  Dr. Rogue Bussing informed-MD-read back process performed at [phone] with md for critical potassium.

## 2016-05-19 NOTE — Progress Notes (Signed)
Patient here today for follow up on Malignant neoplasm of right upper lobe of lung.

## 2016-05-20 ENCOUNTER — Other Ambulatory Visit: Payer: Self-pay | Admitting: *Deleted

## 2016-05-20 DIAGNOSIS — C3411 Malignant neoplasm of upper lobe, right bronchus or lung: Secondary | ICD-10-CM

## 2016-05-20 DIAGNOSIS — E876 Hypokalemia: Secondary | ICD-10-CM

## 2016-05-23 ENCOUNTER — Ambulatory Visit: Payer: Medicaid Other

## 2016-05-23 ENCOUNTER — Telehealth: Payer: Self-pay | Admitting: *Deleted

## 2016-05-23 NOTE — Telephone Encounter (Signed)
Pt's mother spoke with Maudie Mercury, RN in radiation therapy. Pt requesting RF on Fentanyl patch.  Reviewed chart- pt just rcvd RF for Fentanyl patches 50 mcg dosing - 1 patch every 72 hrs on 05/19/16 - total of 5 patches.  I explained to pt's mother that it is too soon to RF fentynl patches. Pt's mother initially stated that patient "did not receive this prescription on Thursday." I informed her that I personally handed the patient the prescription for Norco 5/325, fentynl, Xanax and Fentayl 50 mcg to the patient during her appointment on Thursday last week.  The patient's mother reiterated this information back to the patient.  The patient came to the phone and stated, "Oh, well, I think the pharmacy only gave me three patches because I am out."  I explored with the patient how often she was changing the patches. The patient admitted, "I am changing the patches every 24 hours. This is what my prescription said for me to do. I gosh, I threw the box in the trash so I can not verify this, but this was what the pharmacy put on my prescription RF of the patches last week."  I explained to pt that the prescription was written for Fentanyl patches 50 mcg dosing - 1 patch every 72 hrs on 05/19/16 - total of 5 patches.  I reeducated pt that the patches should be changed every 72 hours. I explained that the patches are applied topically and provide the dosing amount over 3 day. I discussed that there are regulations on dispensing scheduled drugs/narcotics. Pt requested that md "rewrite me a new script for the fentanyl patch. I apologize over my confusion. I really thought this is how I was supposed to take it." I explained that I would inform Dr. Rogue Bussing of her request, but there is not guarantee that the md would do this. In addition, the patient was made aware that that insurance may not pay for a new prescription as it "not time to be refilled yet." Also, the pharmacy may not dispense the prescription since a renewal is  not yet due.  She gave verbal understanding of the dispensing concerns.   RN Barista - spoke with Ronalee Belts at pharmacy. Pt was dispensed #5 of Fentanyl patches 50 mcg dosing. With the instructions to change the patch every 72 hours.  Dr. Rogue Bussing made aware of patient's concerns of needing RF for Fentanyl and that pharmacy dispensed drug as written on RX.

## 2016-05-31 ENCOUNTER — Telehealth: Payer: Self-pay | Admitting: *Deleted

## 2016-05-31 NOTE — Telephone Encounter (Signed)
Received call from [place] in radiation dept. Pt contacted Kim regarding refill request for Fentanyl.  Nira Conn, RN Spoke with Dr. Janese Banks regarding pt's request for fentaynl. Explained to Dr. Janese Banks did not self-administer her previous Fentanyl patch as directed. MD made aware that pt had used up all patches up in 4 day time frame as previously documented on phone note. (1 patch every 24 hrs).  On 05/23/16, Dr. Rogue Bussing did not RF pt's fentanyl patch as pt's requested as this was too soon to fill rx.  Per Dr. Janese Banks, md will not fill RX at this time as pt was given #5 Fentynl. Per Dr. Janese Banks, pt needs to wait until time for RF for rx.  [facility] in radiation, notified pt that Fentaynl rx would not be RF at this time.

## 2016-06-07 ENCOUNTER — Ambulatory Visit
Admission: RE | Admit: 2016-06-07 | Discharge: 2016-06-07 | Disposition: A | Discharge status: Home / Self Care | Payer: Medicaid Other | Source: Ambulatory Visit | Attending: Radiation Oncology | Admitting: Radiation Oncology

## 2016-06-07 ENCOUNTER — Other Ambulatory Visit: Payer: Self-pay | Admitting: *Deleted

## 2016-06-07 ENCOUNTER — Encounter: Payer: Self-pay | Admitting: Radiation Oncology

## 2016-06-07 VITALS — BP 125/85 | HR 128 | Temp 98.2°F | Wt 133.5 lb

## 2016-06-07 DIAGNOSIS — C3411 Malignant neoplasm of upper lobe, right bronchus or lung: Secondary | ICD-10-CM | POA: Insufficient documentation

## 2016-06-07 DIAGNOSIS — C7931 Secondary malignant neoplasm of brain: Secondary | ICD-10-CM | POA: Insufficient documentation

## 2016-06-07 DIAGNOSIS — Z9221 Personal history of antineoplastic chemotherapy: Secondary | ICD-10-CM | POA: Diagnosis not present

## 2016-06-07 DIAGNOSIS — Z923 Personal history of irradiation: Secondary | ICD-10-CM | POA: Insufficient documentation

## 2016-06-07 DIAGNOSIS — F17210 Nicotine dependence, cigarettes, uncomplicated: Secondary | ICD-10-CM | POA: Insufficient documentation

## 2016-06-07 MED ORDER — FENTANYL 50 MCG/HR TD PT72: 50.0000 ug | MEDICATED_PATCH | TRANSDERMAL | 0 refills | Status: DC

## 2016-06-07 NOTE — Progress Notes (Signed)
Radiation Oncology Follow up Note  Name: Tiffany Erickson   Date:   06/07/2016 MRN:  [id number] DOB: 1970/09/09    This 46 y.o. female presents to the clinic today for follow-up for stage IV adenocarcinoma of the lung cancer with recurrent disease in brain.  REFERRING PROVIDER: Elgie Collard, MD  HPI: Patient is a 46 year old female treated to her whole brain 2 for stage IV adenocarcinoma the lung. She has also received radiation therapy to her chest and is also recently been on consolidative Taxol chemotherapy. Recent MRI scan shows new 6 no meter metastasis inferior right occipital lobe with minimal edema and no mass effect. Other metastatic sites including superior frontal gyrus showed treatment effect either stable or regressed. She's not been on narcotic analgesics for about a month and is in considerable pain at this time and is requested fentanyl patch. She's having no neurologic symptoms although overall performance status is declining. I been asked to evaluate her for possible further palliative radiation to her brain.  COMPLICATIONS OF TREATMENT: none  FOLLOW UP COMPLIANCE: keeps appointments   PHYSICAL EXAM:  BP 125/85   Pulse (!) 128   Temp 98.2 F (36.8 C)   Wt 133 lb 7.8 oz (60.6 kg)   LMP 06/07/2015   BMI 24.42 kg/m  Well-developed well-nourished patient in NAD. HEENT reveals PERLA, EOMI, discs not visualized.  Oral cavity is clear. No oral mucosal lesions are identified. Neck is clear without evidence of cervical or supraclavicular adenopathy. Lungs are clear to A&P. Cardiac examination is essentially unremarkable with regular rate and rhythm without murmur rub or thrill. Abdomen is benign with no organomegaly or masses noted. Motor sensory and DTR levels are equal and symmetric in the upper and lower extremities. Cranial nerves II through XII are grossly intact. Proprioception is intact. No peripheral adenopathy or edema is identified. No motor or sensory levels are noted.  Crude visual fields are within normal range.  RADIOLOGY RESULTS: MRI of brain and CT of chest are reviewed and compatible with the above-stated findings.  PLAN: At this time based on the small new lesion of 5 mm do not feel obligated to treat at this point. She's had radiation therapy to her whole brain 2 we are starting to risk brain necrosis from further radiation therapy. Certainly with no neurologic symptoms no headaches and minimal edema I believe we can observe this lesion since I believe were dealing with post a terminal disease in this patient. I have ordered and prescribed fentanyl patch for her pain control. She is to see medical oncology later this week. Will discuss the case with medical oncologist. Burnis Medin be happy to reevaluate her for further palliative treatment should her brain lesions increase or*presenting neurologic problems.  I would like to take this opportunity to thank you for allowing me to participate in the care of your patient.Armstead Peaks., MD

## 2016-06-10 ENCOUNTER — Other Ambulatory Visit: Payer: Medicaid Other

## 2016-06-10 ENCOUNTER — Inpatient Hospital Stay: Payer: Medicaid Other

## 2016-06-10 ENCOUNTER — Ambulatory Visit: Payer: Medicaid Other | Admitting: Internal Medicine

## 2016-06-10 ENCOUNTER — Inpatient Hospital Stay: Payer: Medicaid Other | Attending: Internal Medicine | Admitting: Internal Medicine

## 2016-06-10 ENCOUNTER — Telehealth: Payer: Self-pay | Admitting: *Deleted

## 2016-06-10 ENCOUNTER — Ambulatory Visit: Payer: Medicaid Other

## 2016-06-10 VITALS — BP 99/71 | HR 120 | Temp 97.6°F | Resp 16 | Ht 62.0 in

## 2016-06-10 DIAGNOSIS — Z9181 History of falling: Secondary | ICD-10-CM | POA: Insufficient documentation

## 2016-06-10 DIAGNOSIS — R2681 Unsteadiness on feet: Secondary | ICD-10-CM | POA: Diagnosis not present

## 2016-06-10 DIAGNOSIS — R64 Cachexia: Secondary | ICD-10-CM | POA: Insufficient documentation

## 2016-06-10 DIAGNOSIS — R630 Anorexia: Secondary | ICD-10-CM | POA: Diagnosis not present

## 2016-06-10 DIAGNOSIS — R112 Nausea with vomiting, unspecified: Secondary | ICD-10-CM

## 2016-06-10 DIAGNOSIS — Z5112 Encounter for antineoplastic immunotherapy: Secondary | ICD-10-CM | POA: Insufficient documentation

## 2016-06-10 DIAGNOSIS — R5382 Chronic fatigue, unspecified: Secondary | ICD-10-CM | POA: Insufficient documentation

## 2016-06-10 DIAGNOSIS — C771 Secondary and unspecified malignant neoplasm of intrathoracic lymph nodes: Secondary | ICD-10-CM | POA: Diagnosis not present

## 2016-06-10 DIAGNOSIS — Z79899 Other long term (current) drug therapy: Secondary | ICD-10-CM | POA: Insufficient documentation

## 2016-06-10 DIAGNOSIS — F17210 Nicotine dependence, cigarettes, uncomplicated: Secondary | ICD-10-CM | POA: Diagnosis not present

## 2016-06-10 DIAGNOSIS — E876 Hypokalemia: Secondary | ICD-10-CM | POA: Diagnosis not present

## 2016-06-10 DIAGNOSIS — Z923 Personal history of irradiation: Secondary | ICD-10-CM | POA: Insufficient documentation

## 2016-06-10 DIAGNOSIS — Z9221 Personal history of antineoplastic chemotherapy: Secondary | ICD-10-CM | POA: Insufficient documentation

## 2016-06-10 DIAGNOSIS — C3411 Malignant neoplasm of upper lobe, right bronchus or lung: Secondary | ICD-10-CM | POA: Diagnosis not present

## 2016-06-10 DIAGNOSIS — T451X5A Adverse effect of antineoplastic and immunosuppressive drugs, initial encounter: Secondary | ICD-10-CM

## 2016-06-10 DIAGNOSIS — Z7189 Other specified counseling: Secondary | ICD-10-CM

## 2016-06-10 DIAGNOSIS — D72829 Elevated white blood cell count, unspecified: Secondary | ICD-10-CM | POA: Insufficient documentation

## 2016-06-10 DIAGNOSIS — R110 Nausea: Secondary | ICD-10-CM | POA: Insufficient documentation

## 2016-06-10 DIAGNOSIS — L598 Other specified disorders of the skin and subcutaneous tissue related to radiation: Secondary | ICD-10-CM

## 2016-06-10 DIAGNOSIS — Z599 Problem related to housing and economic circumstances, unspecified: Secondary | ICD-10-CM

## 2016-06-10 DIAGNOSIS — B370 Candidal stomatitis: Secondary | ICD-10-CM | POA: Insufficient documentation

## 2016-06-10 DIAGNOSIS — R350 Frequency of micturition: Secondary | ICD-10-CM | POA: Insufficient documentation

## 2016-06-10 DIAGNOSIS — E860 Dehydration: Secondary | ICD-10-CM | POA: Insufficient documentation

## 2016-06-10 DIAGNOSIS — R109 Unspecified abdominal pain: Secondary | ICD-10-CM | POA: Insufficient documentation

## 2016-06-10 DIAGNOSIS — R300 Dysuria: Secondary | ICD-10-CM | POA: Insufficient documentation

## 2016-06-10 DIAGNOSIS — Z639 Problem related to primary support group, unspecified: Secondary | ICD-10-CM

## 2016-06-10 DIAGNOSIS — R251 Tremor, unspecified: Secondary | ICD-10-CM | POA: Insufficient documentation

## 2016-06-10 DIAGNOSIS — C7931 Secondary malignant neoplasm of brain: Secondary | ICD-10-CM | POA: Diagnosis not present

## 2016-06-10 DIAGNOSIS — Z87440 Personal history of urinary (tract) infections: Secondary | ICD-10-CM | POA: Insufficient documentation

## 2016-06-10 DIAGNOSIS — G4701 Insomnia due to medical condition: Secondary | ICD-10-CM

## 2016-06-10 DIAGNOSIS — K861 Other chronic pancreatitis: Secondary | ICD-10-CM | POA: Diagnosis not present

## 2016-06-10 DIAGNOSIS — K219 Gastro-esophageal reflux disease without esophagitis: Secondary | ICD-10-CM | POA: Diagnosis not present

## 2016-06-10 DIAGNOSIS — Z800 Family history of malignant neoplasm of digestive organs: Secondary | ICD-10-CM | POA: Insufficient documentation

## 2016-06-10 DIAGNOSIS — Z598 Other problems related to housing and economic circumstances: Secondary | ICD-10-CM

## 2016-06-10 LAB — CBC WITH DIFFERENTIAL/PLATELET
BASOS PCT: 0 %
Basophils Absolute: 0 10*3/uL (ref 0–0.1)
Eosinophils Absolute: 0.1 10*3/uL (ref 0–0.7)
Eosinophils Relative: 2 %
HEMATOCRIT: 35 % (ref 35.0–47.0)
HEMOGLOBIN: 11.8 g/dL — AB (ref 12.0–16.0)
LYMPHS ABS: 1 10*3/uL (ref 1.0–3.6)
LYMPHS PCT: 13 %
MCH: 31.7 pg (ref 26.0–34.0)
MCHC: 33.7 g/dL (ref 32.0–36.0)
MCV: 94.3 fL (ref 80.0–100.0)
Monocytes Absolute: 0.6 10*3/uL (ref 0.2–0.9)
Monocytes Relative: 8 %
NEUTROS ABS: 6 10*3/uL (ref 1.4–6.5)
Neutrophils Relative %: 77 %
Platelets: 385 10*3/uL (ref 150–440)
RBC: 3.71 MIL/uL — ABNORMAL LOW (ref 3.80–5.20)
RDW: 14.9 % — ABNORMAL HIGH (ref 11.5–14.5)
WBC: 7.8 10*3/uL (ref 3.6–11.0)

## 2016-06-10 LAB — BASIC METABOLIC PANEL
Anion gap: 9 (ref 5–15)
BUN: 5 mg/dL — ABNORMAL LOW (ref 6–20)
CHLORIDE: 99 mmol/L — AB (ref 101–111)
CO2: 26 mmol/L (ref 22–32)
CREATININE: 0.63 mg/dL (ref 0.44–1.00)
Calcium: 9.3 mg/dL (ref 8.9–10.3)
GFR calc non Af Amer: >60 mL/min (ref >60)
Glucose, Bld: 120 mg/dL — ABNORMAL HIGH (ref 65–99)
Potassium: 3.3 mmol/L — ABNORMAL LOW (ref 3.5–5.1)
Sodium: 134 mmol/L — ABNORMAL LOW (ref 135–145)

## 2016-06-10 MED ORDER — ZOLPIDEM TARTRATE 5 MG PO TABS: 5.0000 mg | ORAL_TABLET | Freq: Every evening | ORAL | 3 refills | Status: DC | PRN

## 2016-06-10 MED ORDER — ONDANSETRON HCL 8 MG PO TABS: 8.0000 mg | ORAL_TABLET | Freq: Three times a day (TID) | ORAL | 6 refills | Status: DC | PRN

## 2016-06-10 MED ORDER — SODIUM CHLORIDE 0.9 % IV SOLN
INTRAVENOUS | Status: DC
  Administered 2016-06-10: 11:00:00 via INTRAVENOUS
  Filled 2016-06-10 (×2): qty 1000

## 2016-06-10 MED ORDER — NYSTATIN 100000 UNIT/ML MT SUSP: 5.0000 mL | Freq: Four times a day (QID) | OROMUCOSAL | 3 refills | Status: DC

## 2016-06-10 NOTE — Assessment & Plan Note (Addendum)
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  DEC 2017- Chest CT- Partial response. ; MRI DEC 2017- Brain metastasis- small new brain mets- evaluated by Dr.Crystal. No RT now;   # Given progression- in brain- recommend opdivo every 2 weeks.  I discussed the mechanism of action; The goal of therapy is palliative; and length of treatments are likely ongoing/based upon the results of the scans. Discussed the potential side effects of immunotherapy including but not limited to diarrhea; skin rash; elevated LFTs/endocrine abnormalities etc.  # Oral thrush- re-start using nystatin. Taper steroids off.   # Bil tremors- multifactorial; anxiety/brain metastases/withdrawal from steroids.  # Dehydration- recommend IVFs today/ Hypokalemia- improved.     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 hydrocodone; fentanyl patch- better.  # Reviewed/counselled regarding the goals of care- being palliative/treatment are usually indefinite-until progression or side effects. Goal is to maintain quality of life as the disease is incurable. Again discussed with the patient that recommend treatment only if her performance status improves. Otherwise hospice is recommended. Unfortunately patient has poor social support.  # follow up in 2 weeks/labs/ opdivo; labs possible IV fluids.

## 2016-06-10 NOTE — Telephone Encounter (Signed)
Outgoing call left vm for patient to contact cancer ctr back. Explained that Barnabas Lister, in social work, approved fianncial cost coverage for pt's Ambien. rx can be picked up at [facility]. I asked pt to contact cancer center to confirm this msg was rcvd.

## 2016-06-10 NOTE — Progress Notes (Signed)
START ON PATHWAY REGIMEN - Non-Small Cell Lung  LOS365: Nivolumab 240 mg q14 Days Until Progression or Unacceptable Toxicity   A cycle is every 14 days:     Nivolumab (Opdivo(R)) 240 mg flat dose in 100 mL NS IV over 60 minutes. Inline filter required (low protein binding) Dose Mod: None Additional Orders: Severe immune-mediated reactions can occur (e.g. pneumonitis, colitis, and hepatitis). See prescribing information for more details including monitoring and required immediate management with steroids. Monitor thyroid, renal, liver  function tests, glucose, and sodium at baseline and periodically during therapy.  **Always confirm dose/schedule in your pharmacy ordering system**    Patient Characteristics: Stage IV Metastatic, Non Squamous, Second Line - Chemotherapy/Immunotherapy, PS = 0, 1, No Prior PD-1/PD-L1  Inhibitor and Immunotherapy Candidate Check here if patient was staged using an edition prior to AJCC Staging - 8th Edition (i.e., prior to June 06, 2016)? false AJCC T Category: TX Current Disease Status: Distant Metastases AJCC N Category: NX AJCC M Category: M1c AJCC 8 Stage Grouping: IVB Histology: Non Squamous Cell ROS1 Rearrangement Status: Negative T790M Mutation Status: Not Applicable - EGFR Mutation Negative/Unknown Other Mutations/Biomarkers: No Other Actionable Mutations PD-L1 Expression Status: Quantity Not Sufficient Chemotherapy/Immunotherapy LOT: Second Line Chemotherapy/Immunotherapy Molecular Targeted Therapy: Not Appropriate ALK Translocation Status: Negative Would you be surprised if this patient died  in the next year? I would NOT be surprised if this patient died in the next year EGFR Mutation Status: Negative/Wild Type BRAF V600E Mutation Status: Quantity Not Sufficient Performance Status: PS = 0, 1 Immunotherapy Candidate Status: Candidate for Immunotherapy Prior Immunotherapy Status: No Prior PD-1/PD-L1 Inhibitor  Intent of  Therapy: Non-Curative / Palliative Intent, Discussed with Patient 

## 2016-06-10 NOTE — Progress Notes (Signed)
[facility] CONSULT NOTE  Patient Care Team: Elgie Collard, MD as PCP - General (Obstetrics and Gynecology) Christene Lye, MD (General Surgery)  CHIEF COMPLAINTS/PURPOSE OF CONSULTATION:   Oncology History   # FEB 2017- ADENOCARCINOMA RUL [T2N2; STAGE III Bronch/FNA- Right paratracheal LN;Dr.Ram]. PET- no distant mets; March 3rd START CARBO-TAXOL Weekly +RT [until may 5th]; June 21st 2017- last dose of consolidation carbo-taxol; Jan 12 2016- PET- Improved on RUL mass/LN  # FEB 2017- MRI Brain 65m focus of met [Right frontal]; Jan 12 2016- 3 new brain mets [start WBRT 8/31]; MRI- DEC 2017- New occipital met-~553m RT on HOLD.   # JAN 2018- START OPIDIVO  # left breast abscess; Aug 2017- PET ? Right Breast abcess  # "Chronic pancreatitis"/ smoker [15ppt]   # MOLECULAR STUDIES: ALK-NEG;ROS-1-NEG     [place]  07/[place]/2015 Initial Diagnosis    Malignant neoplasm of right upper lobe of lung (HCC)        HISTORY OF PRESENTING ILLNESS:  Tiffany Erickson [age].o.  female  history of stage IV lung cancer adenocarcinoma- Currently on surveillance is here for follow-up.  The patient was recently evaluated for new brain metastases by radiation oncology. Given the small brain metastases and the recent whole brain radiation currently on hold.  Patient continues to feel poorly. She has lost a lot of weight. Poor appetite. She continues to feel weak.   She has been falling down; unsteady in the feet. She is currently on tapering dose of steroids. Chronic severe fatigue. Denies any fevers or chills.  No cough or hemoptysis.  Continues to chronic abdominal discomfort; Chronic nausea. She complains of bilateral hand tremors. Poor by mouth intake.   ROS: A complete 10 point review of system is done which is negative except mentioned above in history of present illness  MEDICAL HISTORY:  Past Medical History:  Diagnosis Date  .  Anxiety   . Broken ribs 05/2015   5 ribs  . [place] ([place]  . Concussion 06/01/2015   S/P fall-PT STATES IN PHONE INTERVIEW THAT SHE IS UNSURE IF SHE HAD A CONCUSSION OR NOT  . Frequent UTI    "sometimes" (06/01/2015)  . GERD (gastroesophageal reflux disease)   . [place]   METASTATIC ADENOCARCINOMA OF THE LUNG  . Lung mass    right side noted 06/01/2015  . Multiple fractures of ribs of right side 06/01/2015   "5" S/P fall  LEFT SIDE  . Shortness of breath dyspnea    5 rib fx right side    SURGICAL HISTORY: Past Surgical History:  Procedure Laterality Date  . ENDOBRONCHIAL ULTRASOUND N/A 07/13/2015   Procedure: ENDOBRONCHIAL ULTRASOUND;  Surgeon: PrLaverle HobbyMD;  Location: [facility] ORS;  Service: Pulmonary;  Laterality: N/A;  . ESOPHAGOGASTRODUODENOSCOPY    . EYE MUSCLE SURGERY Left ~ 1979  . MYRINGOTOMY WITH TUBE PLACEMENT Bilateral   . PORTACATH PLACEMENT Left 07/27/2015   Procedure: INSERTION PORT-A-CATH;  Surgeon: TiNestor LewandowskyMD;  Location: [facility] ORS;  Service: General;  Laterality: Left;  . TONSILLECTOMY      SOCIAL HISTORY: Social History   Social History  . Marital status: Single    Spouse name: N/A  . Number of children: N/A  . Years of education: N/A   Occupational History  . Not on file.   Social History Main Topics  . Smoking status: Current Every Day Smoker    Packs/day:  0.50    Years: 28.00    Types: Cigarettes  . Smokeless tobacco: Never Used  . Alcohol use 8.4 oz/week    2 Glasses of wine, 12 Cans of beer per week     Comment: 06/01/2015 "drink probably 4 times/wk; either ;4 beers or 2 glasses of wine when I do drink"  . Drug use: No  . Sexual activity: Not Currently   Other Topics Concern  . Not on file   Social History Narrative  . No narrative on file    FAMILY HISTORY: Family History  Problem Relation Age of Onset  . Colon cancer Maternal Grandfather   . CAD Maternal Grandfather   . Pancreatic  cancer Maternal Grandmother     ALLERGIES:  is allergic to sulfa antibiotics.  MEDICATIONS:  Current Outpatient Prescriptions  Medication Sig Dispense Refill  . ALPRAZolam (XANAX) 0.5 MG tablet Take 1 tablet (0.5 mg total) by mouth 3 (three) times daily as needed for anxiety. 45 tablet 0  . fentaNYL (DURAGESIC - DOSED MCG/HR) 50 MCG/HR Place 1 patch (50 mcg total) onto the skin every 3 (three) days. 10 patch 0  . HYDROcodone-acetaminophen (NORCO/VICODIN) 5-325 MG tablet Take 1 tablet by mouth every 8 (eight) hours as needed for moderate pain. 90 tablet 0  . metoCLOPramide (REGLAN) 10 MG tablet Take 1 tablet (10 mg total) by mouth every 8 (eight) hours as needed for nausea or vomiting. 10 tablet 0  . ondansetron (ZOFRAN) 8 MG tablet Take 1 tablet (8 mg total) by mouth every 8 (eight) hours as needed for nausea or vomiting (start 3 days; after chemo). 40 tablet 6  . pantoprazole (PROTONIX) 20 MG tablet Take 20 mg by mouth at bedtime.     . pregabalin (LYRICA) 75 MG capsule Take 1 capsule (75 mg total) by mouth 2 (two) times daily. 60 capsule 3  . promethazine (PHENERGAN) 25 MG tablet Take 25 mg by mouth every 8 (eight) hours as needed for nausea or vomiting.    Marland Kitchen albuterol (PROVENTIL HFA;VENTOLIN HFA) 108 (90 Base) MCG/ACT inhaler Inhale 2 puffs into the lungs every 6 (six) hours as needed for wheezing or shortness of breath. (Patient not taking: Reported on 06/10/2016) 1 Inhaler 2  . lidocaine-prilocaine (EMLA) cream Apply 1 application topically as needed. (Patient not taking: Reported on 06/10/2016) 30 g 6  . nystatin (MYCOSTATIN) 100000 UNIT/ML suspension Take 5 mLs (500,000 Units total) by mouth 4 (four) times daily. (Patient not taking: Reported on 06/10/2016) 60 mL 3  . pramipexole (MIRAPEX) 0.5 MG tablet TK 1 T PO QHS  2  . zolpidem (AMBIEN) 5 MG tablet Take 1 tablet (5 mg total) by mouth at bedtime as needed for sleep. 30 tablet 3   No current facility-administered medications for this visit.     Facility-Administered Medications Ordered in Other Visits  Medication Dose Route Frequency Provider Last Rate Last Dose  . 0.9 %  sodium chloride infusion   Intravenous Continuous Jacquelin Hawking, NP 999 mL/hr at 06/10/16 1103    . sodium chloride flush (NS) 0.9 % injection 10 mL  10 mL Intravenous PRN Cammie Sickle, MD   10 mL at 03/02/16 1107      .  PHYSICAL EXAMINATION: ECOG PERFORMANCE STATUS: 0 - Asymptomatic  Vitals:   06/10/16 0944 06/10/16 0946  BP: 99/71   Pulse: (!) 144 (!) 120  Resp: 16   Temp: 97.6 F (36.4 C)    There were no vitals filed for this  visit.  GENERAL: Thin build; cachectic-appearing female patient Alert, no distress and comfortable.She Accompanied by her mother/other family. Patient is in a wheelchair. EYES: no pallor or icterus OROPHARYNX: No thrush significantly improved. 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 mediport- no signs of infection.   LABORATORY DATA:  I have reviewed the data as listed Lab Results  Component Value Date   WBC 7.8 06/10/2016   HGB 11.8 (L) 06/10/2016   HCT 35.0 06/10/2016   MCV 94.3 06/10/2016   PLT 385 06/10/2016    Recent Labs  02/24/16 1345 03/02/16 1050 05/17/16 1050 05/19/16 0850 06/10/16 0912  NA 134* 134*  --  134* 134*  K 3.3* 3.4*  --  2.6* 3.3*  CL 101 97*  --  95* 99*  CO2 24 24  --  26 26  GLUCOSE 125* 147*  --  146* 120*  BUN 19 11  --  <5* 5*  CREATININE 0.55 0.58 0.90 0.95 0.63  CALCIUM 8.9 9.0  --  9.5  9.3  GFRNONAA >60 >60  --  >60 >60  GFRAA >60 >60  --  >60 >60  PROT 6.7 6.6  --  7.2  --   ALBUMIN 3.8 3.5  --  4.1  --   AST 21 27  --  27  --   ALT 25 20  --  11*  --   ALKPHOS 52 56  --  45  --   BILITOT 0.5 0.6  --  0.6  --      ASSESSMENT & PLAN:   Malignant neoplasm of right upper lobe of lung (HCC)  # STAGE IV [tiny focus of brain met] Adenocarcinoma T2N2; right upper lobe; s/p chemo-RT.  DEC 2017- Chest CT- Partial response. ; MRI DEC 2017- Brain metastasis- small new brain mets- evaluated by Dr.Crystal. No RT now;   # Given progression- in brain- recommend opdivo every 2 weeks.  I discussed the mechanism of action; The goal of therapy is palliative; and length of treatments are likely ongoing/based upon the results of the scans. Discussed the potential side effects of immunotherapy including but not limited to diarrhea; skin rash; elevated LFTs/endocrine abnormalities etc.  # Oral thrush- re-start using nystatin. Taper steroids off.   # Bil tremors- multifactorial; anxiety/brain metastases/withdrawal from steroids.  # Dehydration- recommend IVFs today/ Hypokalemia- improved.     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 hydrocodone; fentanyl patch- better.  # Reviewed/counselled regarding the goals of care- being palliative/treatment are usually indefinite-until progression or side effects. Goal is to maintain quality of life as the disease is incurable. Again discussed with the patient that recommend treatment only if her performance status improves. Otherwise hospice is recommended. Unfortunately patient has poor social support.  # follow up in 2 weeks/labs/ opdivo; labs possible IV fluids.    Cammie Sickle, MD 06/10/2016 11:40 AM

## 2016-06-10 NOTE — Progress Notes (Signed)
Patient presents to clinic today. Her family did not accompany her with the appointment today.  Admits to Poor social support. "my family is not very supportive."  Lacks social support in the home. - 2 Aunts live out of town. Pt states that her mother causes more emotional distress to her than help. States that she frequently Gets in arguments with mom.  Pt continues to decline physically. - discussed Education officer, museum consult.   Poor historian for medication reconciliation purposes.    Visible lower extremity weakness. Pt shuffling gait-unstable. Patient provided with fall braclet today in clinic and w/c to avoid falls.  Pt reports that she has fallen 4 times in the last 2 weeks at her home. On one fall event last week- she stated 'I Hit my head on the floor. I didn't go to the ER. I felt fine."  Patient drinks about 4 cans (8 ounce) of lemonade per day. This is the only lqd intake.  Decreased appetite. Patient states that she snacks throughout the day.  Pt c/o nausea. Patient reports that she has a medium size bowel movement approximately 3-4 times a week.  Denies any problems with urination. Patient c/o shortness of breath with ambulation. Denies chest pain.  Patient requesting RF on zofran.  pt c/o insomnia. - She is not able to take her Ambien as "I am not able to afford this drug. My medicaid is not covering the drug. Pt is uncertain if she is taking the mirapex. She denies any need for RF on narcotics.

## 2016-06-11 ENCOUNTER — Encounter (HOSPITAL_COMMUNITY): Payer: Self-pay | Admitting: Emergency Medicine

## 2016-06-11 ENCOUNTER — Inpatient Hospital Stay (HOSPITAL_COMMUNITY)
Admission: EM | Admit: 2016-06-11 | Discharge: 2016-06-16 | DRG: 080 | Disposition: A | Discharge status: Hospice | Payer: Medicaid Other | Attending: Family Medicine | Admitting: Family Medicine

## 2016-06-11 ENCOUNTER — Emergency Department (HOSPITAL_COMMUNITY): Payer: Medicaid Other

## 2016-06-11 DIAGNOSIS — R000 Tachycardia, unspecified: Secondary | ICD-10-CM | POA: Diagnosis present

## 2016-06-11 DIAGNOSIS — R410 Disorientation, unspecified: Secondary | ICD-10-CM | POA: Diagnosis present

## 2016-06-11 DIAGNOSIS — F17210 Nicotine dependence, cigarettes, uncomplicated: Secondary | ICD-10-CM | POA: Diagnosis present

## 2016-06-11 DIAGNOSIS — Z23 Encounter for immunization: Secondary | ICD-10-CM

## 2016-06-11 DIAGNOSIS — Z800 Family history of malignant neoplasm of digestive organs: Secondary | ICD-10-CM

## 2016-06-11 DIAGNOSIS — J189 Pneumonia, unspecified organism: Secondary | ICD-10-CM | POA: Diagnosis present

## 2016-06-11 DIAGNOSIS — Z79899 Other long term (current) drug therapy: Secondary | ICD-10-CM

## 2016-06-11 DIAGNOSIS — K219 Gastro-esophageal reflux disease without esophagitis: Secondary | ICD-10-CM | POA: Diagnosis present

## 2016-06-11 DIAGNOSIS — G9340 Encephalopathy, unspecified: Secondary | ICD-10-CM | POA: Diagnosis present

## 2016-06-11 DIAGNOSIS — C3411 Malignant neoplasm of upper lobe, right bronchus or lung: Secondary | ICD-10-CM | POA: Diagnosis present

## 2016-06-11 DIAGNOSIS — W109XXA Fall (on) (from) unspecified stairs and steps, initial encounter: Secondary | ICD-10-CM | POA: Diagnosis present

## 2016-06-11 DIAGNOSIS — D649 Anemia, unspecified: Secondary | ICD-10-CM | POA: Diagnosis present

## 2016-06-11 DIAGNOSIS — R441 Visual hallucinations: Secondary | ICD-10-CM | POA: Diagnosis present

## 2016-06-11 DIAGNOSIS — Z79891 Long term (current) use of opiate analgesic: Secondary | ICD-10-CM

## 2016-06-11 DIAGNOSIS — M25552 Pain in left hip: Secondary | ICD-10-CM | POA: Diagnosis present

## 2016-06-11 DIAGNOSIS — Z66 Do not resuscitate: Secondary | ICD-10-CM | POA: Diagnosis present

## 2016-06-11 DIAGNOSIS — M791 Myalgia: Secondary | ICD-10-CM | POA: Diagnosis present

## 2016-06-11 DIAGNOSIS — C7931 Secondary malignant neoplasm of brain: Secondary | ICD-10-CM | POA: Diagnosis present

## 2016-06-11 DIAGNOSIS — Z882 Allergy status to sulfonamides status: Secondary | ICD-10-CM

## 2016-06-11 DIAGNOSIS — E876 Hypokalemia: Secondary | ICD-10-CM | POA: Diagnosis present

## 2016-06-11 DIAGNOSIS — G936 Cerebral edema: Principal | ICD-10-CM | POA: Diagnosis present

## 2016-06-11 MED ORDER — OXYCODONE-ACETAMINOPHEN 5-325 MG PO TABS
2.0000 | ORAL_TABLET | Freq: Once | ORAL | Status: AC
Start: 2016-06-11 — End: 2016-06-11
  Administered 2016-06-11: 2 via ORAL
  Filled 2016-06-11: qty 2

## 2016-06-11 MED ORDER — ONDANSETRON 8 MG PO TBDP
8.0000 mg | ORAL_TABLET | Freq: Once | ORAL | Status: AC
  Administered 2016-06-11: 8 mg via ORAL
  Filled 2016-06-11: qty 1

## 2016-06-11 NOTE — ED Notes (Signed)
Patient does not want to be stuck anymore.

## 2016-06-11 NOTE — ED Provider Notes (Signed)
[facility] DEPT Provider Note   CSN: [id number] Arrival date & time: 06/11/16  1843  By signing my name below, I, Dora Sims, attest that this documentation has been prepared under the direction and in the presence [employer], PA-C. Electronically Signed: Dora Sims, Scribe. 06/11/2016. 8:52 PM.   History   Chief Complaint Chief Complaint  Patient presents with  . Fall  . Hip Pain    The history is provided by the patient. No language interpreter was used.     HPI Comments: Tiffany Erickson is a 46 y.o. female with hx of primary lung CA with brain metastasis who presents to the Emergency Department via EMS complaining of a mechanical fall that occurred a few hours ago. She states she slipped while walking downstairs and injured her left hip. She denies hitting her head or losing consciousness. Pt reports severe left hip pain currently and states she is unable to ambulate or bear weight on her left lower extremity secondary to pain. Patient denies trying any medications or treatments PTA but the triage note indicates that patient received 100 mcg fentanyl from EMS en route tonight. She ambulates without assistance at baseline. She denies SOB or lightheadedness prior to her fall. She further denies numbness/tingling, neuro deficits, weakness, bowel/bladder incontinence, or any other associated symptoms.   Past Medical History:  Diagnosis Date  . Anxiety   . Broken ribs 05/2015   5 ribs  . Chronic pancreatitis ([facility])   . Concussion 06/01/2015   S/P fall-PT STATES IN PHONE INTERVIEW THAT SHE IS UNSURE IF SHE HAD A CONCUSSION OR NOT  . Frequent UTI    "sometimes" (06/01/2015)  . GERD (gastroesophageal reflux disease)   . Lung cancer ([place]) 07/13/15   METASTATIC ADENOCARCINOMA OF THE LUNG  . Lung mass    right side noted 06/01/2015  . Multiple fractures of ribs of right side 06/01/2015   "5" S/P fall  LEFT SIDE  . Shortness of breath dyspnea    5 rib fx right side    Patient  Active Problem List   Diagnosis Date Noted  . Delirium 06/12/2016  . Counseling regarding goals of care 06/10/2016  . Brain metastasis ([place]) 12/09/2015  . Hypokalemia 11/25/2015  . Encounter for antineoplastic chemotherapy 11/24/2015  . Chronic pancreatitis ([place]) 11/24/2015  . Drug-induced neutropenia ([place]) 11/24/2015  . Malignant neoplasm of right upper lobe of lung ([place]) 07/30/2015  . Dyspnea   . Fall 06/02/2015  . Multiple rib fractures 06/01/2015    Past Surgical History:  Procedure Laterality Date  . ENDOBRONCHIAL ULTRASOUND N/A 07/13/2015   Procedure: ENDOBRONCHIAL ULTRASOUND;  Surgeon: Laverle Hobby, MD;  Location: [facility] ORS;  Service: Pulmonary;  Laterality: N/A;  . ESOPHAGOGASTRODUODENOSCOPY    . EYE MUSCLE SURGERY Left ~ 1979  . MYRINGOTOMY WITH TUBE PLACEMENT Bilateral   . PORTACATH PLACEMENT Left 07/27/2015   Procedure: INSERTION PORT-A-CATH;  Surgeon: [name], MD;  Location: [facility] ORS;  Service: General;  Laterality: Left;  . TONSILLECTOMY      OB History    No data available       Home Medications    Prior to Admission medications   Medication Sig Start Date End Date Taking? Authorizing Provider  albuterol (PROVENTIL HFA;VENTOLIN HFA) 108 (90 Base) MCG/ACT inhaler Inhale 2 puffs into the lungs every 6 (six) hours as needed for wheezing or shortness of breath. Patient not taking: Reported on 06/10/2016 03/24/16   Cammie Sickle, MD  ALPRAZolam Duanne Moron) 0.5 MG tablet Take 1  tablet (0.5 mg total) by mouth 3 (three) times daily as needed for anxiety. 05/19/16   Cammie Sickle, MD  fentaNYL (DURAGESIC - DOSED MCG/HR) 50 MCG/HR Place 1 patch (50 mcg total) onto the skin every 3 (three) days. 06/07/16   Noreene Filbert, MD  HYDROcodone-acetaminophen (NORCO/VICODIN) 5-325 MG tablet Take 1 tablet by mouth every 8 (eight) hours as needed for moderate pain. 05/19/16   Cammie Sickle, MD  lidocaine-prilocaine (EMLA) cream Apply 1 application topically as  needed. Patient not taking: Reported on 06/10/2016 12/09/15   Cammie Sickle, MD  metoCLOPramide (REGLAN) 10 MG tablet Take 1 tablet (10 mg total) by mouth every 8 (eight) hours as needed for nausea or vomiting. 02/03/15   [name], MD  nystatin (MYCOSTATIN) 100000 UNIT/ML suspension Take 5 mLs (500,000 Units total) by mouth 4 (four) times daily. Patient not taking: Reported on 06/10/2016 06/10/16   Cammie Sickle, MD  ondansetron (ZOFRAN) 8 MG tablet Take 1 tablet (8 mg total) by mouth every 8 (eight) hours as needed for nausea or vomiting (start 3 days; after chemo). 06/10/16   Cammie Sickle, MD  pantoprazole (PROTONIX) 20 MG tablet Take 20 mg by mouth at bedtime.     Historical Provider, MD  pramipexole (MIRAPEX) 0.5 MG tablet TK 1 T PO QHS 05/23/16   Historical Provider, MD  pregabalin (LYRICA) 75 MG capsule Take 1 capsule (75 mg total) by mouth 2 (two) times daily. 02/03/16   Cammie Sickle, MD  promethazine (PHENERGAN) 25 MG tablet Take 25 mg by mouth every 8 (eight) hours as needed for nausea or vomiting.    Historical Provider, MD  zolpidem (AMBIEN) 5 MG tablet Take 1 tablet (5 mg total) by mouth at bedtime as needed for sleep. 06/10/16   Cammie Sickle, MD    Family History Family History  Problem Relation Age of Onset  . Colon cancer Maternal Grandfather   . CAD Maternal Grandfather   . Pancreatic cancer Maternal Grandmother     Social History Social History  Substance Use Topics  . Smoking status: Current Every Day Smoker    Packs/day: 0.50    Years: 28.00    Types: Cigarettes  . Smokeless tobacco: Never Used  . Alcohol use 8.4 oz/week    2 Glasses of wine, 12 Cans of beer per week     Comment: 06/01/2015 "drink probably 4 times/wk; either ;4 beers or 2 glasses of wine when I do drink"     Allergies   Sulfa antibiotics   Review of Systems Review of Systems A complete 10 system review of systems was obtained and all systems are negative except as  noted in the HPI and PMH.    Physical Exam Updated Vital Signs BP 106/60   Pulse (!) 137   Temp 98.2 F (36.8 C) (Oral)   Resp 17   LMP 06/07/2015   SpO2 94%   Physical Exam  Constitutional: She is oriented to person, place, and time. She appears well-developed and well-nourished. No distress.  Chronically ill appearing  HENT:  Head: Normocephalic and atraumatic.  No battle's sign or raccoon's eyes. No hematoma or contusion.  Eyes: Conjunctivae and EOM are normal. Pupils are equal, round, and reactive to light. No scleral icterus.  Neck: Normal range of motion.  Cardiovascular: Regular rhythm and intact distal pulses.   Borderline tachycardia  Pulmonary/Chest: Effort normal. No respiratory distress. She exhibits no tenderness.  No respiratory distress. Chest expansion symmetric.  Abdominal:  Soft. She exhibits no distension. There is no tenderness.  Soft, nontender abdomen.  Musculoskeletal: Normal range of motion.  TTP to left hip. No leg shortening or malrotation. No bony deformity or crepitus. TTP to the left lumbosacral paraspinal muscles.  Neurological: She is alert and oriented to person, place, and time.  Tremulous, intermittent. Sensation to light touch intact in all extremities.  Skin: Skin is warm and dry. No rash noted. She is not diaphoretic. No erythema. No pallor.  Psychiatric: She has a normal mood and affect. Her behavior is normal. She is actively hallucinating.  Intermittent hallucinations of mother; also mildly confused at times.  Nursing note and vitals reviewed.    ED Treatments / Results  Labs (all labs ordered are listed, but only abnormal results are displayed) Labs Reviewed  CBC WITH DIFFERENTIAL/PLATELET - Abnormal; Notable for the following:       Result Value   RBC 3.30 (*)    Hemoglobin 10.5 (*)    HCT 31.8 (*)    All other components within normal limits  BASIC METABOLIC PANEL - Abnormal; Notable for the following:    Potassium 3.3 (*)     BUN 5 (*)    All other components within normal limits  URINALYSIS, ROUTINE W REFLEX MICROSCOPIC  I-STAT TROPOININ, ED    EKG  EKG Interpretation None       Radiology Dg Lumbar Spine Complete  Result Date: 06/11/2016 CLINICAL DATA:  46 year old with current history of metastatic lung cancer who fell down stairs at home earlier this evening and complains of left hip pain. Initial encounter. EXAM: LUMBAR SPINE - COMPLETE 4+ VIEW COMPARISON:  Bone window images from CT abdomen and pelvis 06/01/2015. FINDINGS: Five non-rib-bearing lumbar vertebrae with anatomic alignment. Slight thoracolumbar dextroscoliosis. No fractures. Mild disc space narrowing and endplate hypertrophic changes at L3-4. Remaining disc spaces well-preserved. No pars defects. No significant facet arthropathy. Mild abdominal aortic atherosclerosis without evidence of aneurysm. IMPRESSION: 1. No acute osseous abnormality. 2. Mild degenerative disc disease and spondylosis at L3-4. 3. Mild thoracolumbar dextroscoliosis. 4. Mild abdominal aortic atherosclerosis without evidence of aneurysm. Electronically Signed   By: Evangeline Dakin M.D.   On: 06/11/2016 23:51   Ct Head Wo Contrast  Result Date: 06/12/2016 CLINICAL DATA:  46 y/o F; history of metastatic lung cancer with confusion. EXAM: CT HEAD WITHOUT CONTRAST TECHNIQUE: Contiguous axial images were obtained from the base of the skull through the vertex without intravenous contrast. COMPARISON:  05/17/2016 MRI of the head FINDINGS: Brain: Metastasis within the right occipital lobe, right parafalcine frontal lobe, left parafalcine parietal lobe are increased in size in comparison with the prior MRI given differences in technique for example the right parafalcine frontal lesion measures 18 mm, previously 10 mm measured in a similar fashion (series 2, image 22). Additionally the areas of edema surrounding the metastasis has increased. There is no evidence for large acute infarct,  herniation, or brain parenchymal hemorrhage. Vascular: No hyperdense vessel or unexpected calcification. Skull: Normal. Negative for fracture or focal lesion. Sinuses/Orbits: Mucous retention cyst within the sphenoid sinus. Opacification of the bilateral mastoid air cells similar prior MRI. Orbits are unremarkable. Other: None. IMPRESSION: In comparison with the prior MRI of the brain metastasis in the right frontal lobe, left parietal lobe, and right occipital lobe are increased in size as well as associated edema. Suboptimal evaluation for new metastasis in the absence of intravenous contrast. No evidence for large acute infarct or intracranial hemorrhage. Electronically Signed   By:  Kristine Garbe M.D.   On: 06/12/2016 04:30   Ct Angio Chest Pe W And/or Wo Contrast  Result Date: 06/12/2016 CLINICAL DATA:  Confusion and tachycardia EXAM: CT ANGIOGRAPHY CHEST WITH CONTRAST TECHNIQUE: Multidetector CT imaging of the chest was performed using the standard protocol during bolus administration of intravenous contrast. Multiplanar CT image reconstructions and MIPs were obtained to evaluate the vascular anatomy. CONTRAST:  100 mL Isovue 370 intravenous COMPARISON:  CT chest 05/17/2016 FINDINGS: Cardiovascular: Satisfactory opacification of the pulmonary arteries to the segmental level. No evidence of pulmonary embolism. Normal heart size. Small pericardial effusion. Non aneurysmal aorta. No dissection. Mediastinum/Nodes: Stable 9 mm right paratracheal lymph node. Small lymph nodes adjacent to the aortic arch are unchanged. No axillary adenopathy. Trachea and normal. Thyroid normal. Esophagus unremarkable. Lungs/Pleura: Development of small right-sided pleural effusion. Increased consolidation within the apical portion of right upper lobe at the site of prior tumor. Air bronchograms and dilated bronchi are again visualized. Right paramediastinal presumed post radiation fibrotic changes are again identified.  Interim finding of mild ground-glass nodular densities in the right lower lobe. Additional scattered foci of ground-glass density in the left upper lobe as well. Upper Abdomen: No acute abnormality in the upper abdomen. Musculoskeletal: No suspicious bone lesion. Review of the MIP images confirms the above findings. IMPRESSION: 1. No CT evidence for acute pulmonary embolus. 2. New small right-sided pleural effusion. Interval increase in consolidation within the apical portion of right upper lobe in the region of previously noted tumor ; it is uncertain if this represents increase in size of pre-existing mass or superimposed infection/pneumonia. 3. 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 Increased ground-glass density in the right lower lobe with scattered foci of ground-glass in the left upper lobe suggests possible infectious or inflammatory process. 4. Stable right peritracheal 9 mm lymph node. Electronically Signed   By: Donavan Foil M.D.   On: 06/12/2016 03:35   Dg Hip Unilat W Or Wo Pelvis 2-3 Views Left  Result Date: 06/11/2016 CLINICAL DATA:  46 year old with current history of metastatic right upper lobe lung cancer who fell down stairs at home earlier this evening and complains of left hip pain. Initial encounter. EXAM: DG HIP (WITH OR WITHOUT PELVIS) 2-3V LEFT COMPARISON:  Bone window images from CT pelvis 06/01/2015. FINDINGS: No evidence of acute fracture or dislocation. Well-preserved joint space. Well-preserved bone mineral density. Included AP pelvis demonstrates a symmetric normal-appearing contralateral right hip. Sacroiliac joints and symphysis pubis intact. No evidence of osseous metastatic disease. IMPRESSION: Normal examination. Electronically Signed   By: Evangeline Dakin M.D.   On: 06/11/2016 23:49    Procedures Procedures (including critical care time)  DIAGNOSTIC STUDIES: Oxygen Saturation is 92% on RA,  low by my interpretation.    COORDINATION OF CARE: 8:56 PM Discussed treatment plan with pt at bedside and pt agreed to plan.  Medications Ordered in ED Medications  oxyCODONE-acetaminophen (PERCOCET/ROXICET) 5-325 MG per tablet 2 tablet (2 tablets Oral Given 06/11/16 2152)  ondansetron (ZOFRAN-ODT) disintegrating tablet 8 mg (8 mg Oral Given 06/11/16 2232)  sodium chloride 0.9 % bolus 1,000 mL (0 mLs Intravenous Stopped 06/12/16 0110)  iopamidol (ISOVUE-370) 76 % injection 100 mL (100 mLs Intravenous Contrast Given 06/12/16 0254)     Initial Impression / Assessment and Plan / ED Course  I have reviewed the triage vital signs and the nursing notes.  Pertinent labs & imaging results that were available during my care of the  patient were reviewed by me and considered in my medical decision making (see chart for details).  Clinical Course     46 year old female with a history of adenocarcinoma of the lung with metastases to the brain presents to the emergency department for complaints of hip pain. Patient lives at home, alone. She reports that she fell today. Patient with no leg shortening or malrotation on exam. She is neurovascularly intact. X-ray of hip and back show no acute traumatic injury.  Over the patient's ED course, she developed a tachycardia of unclear etiology. This did not improve with 2L IVF. Her troponin is reassuring and there is no evidence of pulmonary embolus on chest CT. She was also found to be intimately confused. Patient hallucinating that her mother is with her the hospital. She is unable to ambulate steadily secondary to tremor and generalized weakness; past oncology note reports shuffling gait.  Given intermittent confusion and hallucinations which appear new for the patient, compared to prior oncology notes, will admit for further evaluation of delirium. It is possible that this may also be due to worsening brain metastasis and edema referenced on heat CT today. Patient  amenable to admission. Case discussed with Dr. Loleta Books of [facility]; temporary admission orders placed.   Final Clinical Impressions(s) / ED Diagnoses   Final diagnoses:  Delirium  Tachycardia    New Prescriptions New Prescriptions   No medications on file    I personally performed the services described in this documentation, which was scribed in my presence. The recorded information has been reviewed and is accurate.      Antonietta Breach, PA-C 06/12/16 [place], MD 06/12/16 [phone]

## 2016-06-11 NOTE — ED Notes (Signed)
No respiratory or acute distress noted alert and talking call light in reach.

## 2016-06-11 NOTE — ED Notes (Signed)
Pt found to have pulled out IV no active bleeding noted alert and oriented x 3 call light in reach no respiratory or acute distress noted.

## 2016-06-11 NOTE — ED Triage Notes (Signed)
Per EMS pt complaint of left hip pain post fall down stairs; pt unable to bear weight on left leg. No rotation/shortening noted to left leg. Pt given 100 mcg fentanyl IV en route with EMS.

## 2016-06-12 ENCOUNTER — Encounter (HOSPITAL_COMMUNITY): Payer: Self-pay | Admitting: Radiology

## 2016-06-12 ENCOUNTER — Other Ambulatory Visit (HOSPITAL_COMMUNITY): Payer: Self-pay

## 2016-06-12 ENCOUNTER — Emergency Department (HOSPITAL_COMMUNITY): Payer: Medicaid Other

## 2016-06-12 ENCOUNTER — Other Ambulatory Visit: Payer: Self-pay

## 2016-06-12 DIAGNOSIS — J181 Lobar pneumonia, unspecified organism: Secondary | ICD-10-CM

## 2016-06-12 DIAGNOSIS — J189 Pneumonia, unspecified organism: Secondary | ICD-10-CM | POA: Diagnosis present

## 2016-06-12 DIAGNOSIS — Z66 Do not resuscitate: Secondary | ICD-10-CM | POA: Diagnosis present

## 2016-06-12 DIAGNOSIS — C3411 Malignant neoplasm of upper lobe, right bronchus or lung: Secondary | ICD-10-CM | POA: Diagnosis present

## 2016-06-12 DIAGNOSIS — R000 Tachycardia, unspecified: Secondary | ICD-10-CM | POA: Diagnosis present

## 2016-06-12 DIAGNOSIS — F17210 Nicotine dependence, cigarettes, uncomplicated: Secondary | ICD-10-CM | POA: Diagnosis present

## 2016-06-12 DIAGNOSIS — D649 Anemia, unspecified: Secondary | ICD-10-CM | POA: Diagnosis present

## 2016-06-12 DIAGNOSIS — E876 Hypokalemia: Secondary | ICD-10-CM

## 2016-06-12 DIAGNOSIS — Z800 Family history of malignant neoplasm of digestive organs: Secondary | ICD-10-CM | POA: Diagnosis not present

## 2016-06-12 DIAGNOSIS — K219 Gastro-esophageal reflux disease without esophagitis: Secondary | ICD-10-CM | POA: Diagnosis present

## 2016-06-12 DIAGNOSIS — C7931 Secondary malignant neoplasm of brain: Secondary | ICD-10-CM

## 2016-06-12 DIAGNOSIS — R441 Visual hallucinations: Secondary | ICD-10-CM | POA: Diagnosis present

## 2016-06-12 DIAGNOSIS — R410 Disorientation, unspecified: Secondary | ICD-10-CM | POA: Diagnosis not present

## 2016-06-12 DIAGNOSIS — Z79891 Long term (current) use of opiate analgesic: Secondary | ICD-10-CM | POA: Diagnosis not present

## 2016-06-12 DIAGNOSIS — Z882 Allergy status to sulfonamides status: Secondary | ICD-10-CM | POA: Diagnosis not present

## 2016-06-12 DIAGNOSIS — Z23 Encounter for immunization: Secondary | ICD-10-CM | POA: Diagnosis not present

## 2016-06-12 DIAGNOSIS — G9340 Encephalopathy, unspecified: Secondary | ICD-10-CM | POA: Diagnosis present

## 2016-06-12 DIAGNOSIS — Z79899 Other long term (current) drug therapy: Secondary | ICD-10-CM | POA: Diagnosis not present

## 2016-06-12 DIAGNOSIS — M791 Myalgia: Secondary | ICD-10-CM | POA: Diagnosis present

## 2016-06-12 DIAGNOSIS — W109XXA Fall (on) (from) unspecified stairs and steps, initial encounter: Secondary | ICD-10-CM | POA: Diagnosis present

## 2016-06-12 DIAGNOSIS — M25552 Pain in left hip: Secondary | ICD-10-CM | POA: Diagnosis present

## 2016-06-12 DIAGNOSIS — G936 Cerebral edema: Secondary | ICD-10-CM | POA: Diagnosis not present

## 2016-06-12 LAB — LACTIC ACID, PLASMA: Lactic Acid, Venous: 0.8 mmol/L (ref 0.5–1.9)

## 2016-06-12 LAB — CBC WITH DIFFERENTIAL/PLATELET
BASOS ABS: 0 10*3/uL (ref 0.0–0.1)
Basophils Relative: 0 %
EOS ABS: 0.1 10*3/uL (ref 0.0–0.7)
EOS PCT: 2 %
HCT: 31.8 % — ABNORMAL LOW (ref 36.0–46.0)
Hemoglobin: 10.5 g/dL — ABNORMAL LOW (ref 12.0–15.0)
LYMPHS PCT: 24 %
Lymphs Abs: 1 10*3/uL (ref 0.7–4.0)
MCH: 31.8 pg (ref 26.0–34.0)
MCHC: 33 g/dL (ref 30.0–36.0)
MCV: 96.4 fL (ref 78.0–100.0)
MONO ABS: 0.4 10*3/uL (ref 0.1–1.0)
Monocytes Relative: 9 %
Neutro Abs: 2.6 10*3/uL (ref 1.7–7.7)
Neutrophils Relative %: 65 %
PLATELETS: 398 10*3/uL (ref 150–400)
RBC: 3.3 MIL/uL — ABNORMAL LOW (ref 3.87–5.11)
RDW: 15 % (ref 11.5–15.5)
WBC: 4 10*3/uL (ref 4.0–10.5)

## 2016-06-12 LAB — BASIC METABOLIC PANEL
ANION GAP: 9 (ref 5–15)
BUN: 5 mg/dL — AB (ref 6–20)
CALCIUM: 8.9 mg/dL (ref 8.9–10.3)
CO2: 25 mmol/L (ref 22–32)
Chloride: 104 mmol/L (ref 101–111)
Creatinine, Ser: 0.47 mg/dL (ref 0.44–1.00)
GFR calc Af Amer: >60 mL/min (ref >60)
GLUCOSE: 93 mg/dL (ref 65–99)
Potassium: 3.3 mmol/L — ABNORMAL LOW (ref 3.5–5.1)
SODIUM: 138 mmol/L (ref 135–145)

## 2016-06-12 LAB — INFLUENZA PANEL BY PCR (TYPE A & B)
Influenza A By PCR: NEGATIVE
Influenza B By PCR: NEGATIVE

## 2016-06-12 LAB — MAGNESIUM: Magnesium: 1.3 mg/dL — ABNORMAL LOW (ref 1.7–2.4)

## 2016-06-12 LAB — I-STAT TROPONIN, ED: Troponin i, poc: 0 ng/mL (ref 0.00–0.08)

## 2016-06-12 MED ORDER — HYDROCODONE-ACETAMINOPHEN 5-325 MG PO TABS
1.0000 | ORAL_TABLET | ORAL | Status: DC | PRN
  Administered 2016-06-13 – 2016-06-14 (×3): 1 via ORAL
  Administered 2016-06-14 – 2016-06-15 (×4): 2 via ORAL
  Filled 2016-06-12 (×2): qty 2
  Filled 2016-06-12: qty 1
  Filled 2016-06-12: qty 2
  Filled 2016-06-12 (×2): qty 1
  Filled 2016-06-12: qty 2

## 2016-06-12 MED ORDER — SENNOSIDES-DOCUSATE SODIUM 8.6-50 MG PO TABS: 1.0000 | ORAL_TABLET | Freq: Every evening | ORAL | Status: DC | PRN

## 2016-06-12 MED ORDER — ZOLPIDEM TARTRATE 5 MG PO TABS
5.0000 mg | ORAL_TABLET | Freq: Every evening | ORAL | Status: DC | PRN
  Administered 2016-06-13: 5 mg via ORAL
  Filled 2016-06-12: qty 1

## 2016-06-12 MED ORDER — ACETAMINOPHEN 325 MG PO TABS: 650.0000 mg | ORAL_TABLET | Freq: Four times a day (QID) | ORAL | Status: DC | PRN

## 2016-06-12 MED ORDER — DEXTROSE 5 % IV SOLN
1.0000 g | INTRAVENOUS | Status: DC
  Administered 2016-06-12 – 2016-06-14 (×3): 1 g via INTRAVENOUS
  Filled 2016-06-12 (×3): qty 10

## 2016-06-12 MED ORDER — FENTANYL 50 MCG/HR TD PT72
50.0000 ug | MEDICATED_PATCH | TRANSDERMAL | Status: DC
  Administered 2016-06-12 – 2016-06-15 (×2): 50 ug via TRANSDERMAL
  Filled 2016-06-12 (×2): qty 1

## 2016-06-12 MED ORDER — METOPROLOL TARTRATE 25 MG PO TABS
12.5000 mg | ORAL_TABLET | Freq: Two times a day (BID) | ORAL | Status: DC
  Administered 2016-06-12 – 2016-06-13 (×4): 12.5 mg via ORAL
  Filled 2016-06-12 (×4): qty 1

## 2016-06-12 MED ORDER — ONDANSETRON HCL 4 MG PO TABS: 4.0000 mg | ORAL_TABLET | Freq: Four times a day (QID) | ORAL | Status: DC | PRN

## 2016-06-12 MED ORDER — SODIUM CHLORIDE 0.9% FLUSH
10.0000 mL | INTRAVENOUS | Status: DC | PRN
  Administered 2016-06-14 – 2016-06-16 (×2): 10 mL
  Filled 2016-06-12 (×2): qty 40

## 2016-06-12 MED ORDER — ONDANSETRON HCL 4 MG/2ML IJ SOLN
4.0000 mg | Freq: Four times a day (QID) | INTRAMUSCULAR | Status: DC | PRN
  Administered 2016-06-12 – 2016-06-15 (×2): 4 mg via INTRAVENOUS
  Filled 2016-06-12 (×2): qty 2

## 2016-06-12 MED ORDER — SODIUM CHLORIDE 0.9% FLUSH
3.0000 mL | Freq: Two times a day (BID) | INTRAVENOUS | Status: DC
  Administered 2016-06-12: 3 mL via INTRAVENOUS

## 2016-06-12 MED ORDER — INFLUENZA VAC SPLIT QUAD 0.5 ML IM SUSY
0.5000 mL | PREFILLED_SYRINGE | INTRAMUSCULAR | Status: AC
  Administered 2016-06-16: 0.5 mL via INTRAMUSCULAR
  Filled 2016-06-12: qty 0.5

## 2016-06-12 MED ORDER — ENOXAPARIN SODIUM 40 MG/0.4ML ~~LOC~~ SOLN
40.0000 mg | SUBCUTANEOUS | Status: DC
  Administered 2016-06-12 – 2016-06-13 (×2): 40 mg via SUBCUTANEOUS
  Filled 2016-06-12 (×2): qty 0.4

## 2016-06-12 MED ORDER — SODIUM CHLORIDE 0.9 % IV BOLUS (SEPSIS)
1000.0000 mL | INTRAVENOUS | Status: AC
  Administered 2016-06-11: 1000 mL via INTRAVENOUS

## 2016-06-12 MED ORDER — IOPAMIDOL (ISOVUE-370) INJECTION 76%
100.0000 mL | Freq: Once | INTRAVENOUS | Status: AC | PRN
  Administered 2016-06-12: 100 mL via INTRAVENOUS

## 2016-06-12 MED ORDER — ALPRAZOLAM 0.5 MG PO TABS
0.5000 mg | ORAL_TABLET | Freq: Three times a day (TID) | ORAL | Status: DC | PRN
  Administered 2016-06-12 – 2016-06-16 (×5): 0.5 mg via ORAL
  Filled 2016-06-12 (×5): qty 1

## 2016-06-12 MED ORDER — SODIUM CHLORIDE 0.9% FLUSH
10.0000 mL | Freq: Two times a day (BID) | INTRAVENOUS | Status: DC
  Administered 2016-06-13: 10 mL

## 2016-06-12 MED ORDER — POTASSIUM CHLORIDE IN NACL 20-0.9 MEQ/L-% IV SOLN
INTRAVENOUS | Status: DC
  Administered 2016-06-12: 17:00:00 via INTRAVENOUS
  Administered 2016-06-12: 100 mL/h via INTRAVENOUS
  Administered 2016-06-13 – 2016-06-15 (×5): via INTRAVENOUS
  Filled 2016-06-12 (×9): qty 1000

## 2016-06-12 MED ORDER — AZITHROMYCIN 250 MG PO TABS
500.0000 mg | ORAL_TABLET | ORAL | Status: AC
Start: 2016-06-12 — End: 2016-06-14
  Administered 2016-06-12 – 2016-06-14 (×3): 500 mg via ORAL
  Filled 2016-06-12 (×3): qty 2

## 2016-06-12 MED ORDER — ACETAMINOPHEN 650 MG RE SUPP: 650.0000 mg | Freq: Four times a day (QID) | RECTAL | Status: DC | PRN

## 2016-06-12 MED ORDER — PNEUMOCOCCAL VAC POLYVALENT 25 MCG/0.5ML IJ INJ
0.5000 mL | INJECTION | INTRAMUSCULAR | Status: AC
  Administered 2016-06-16: 0.5 mL via INTRAMUSCULAR
  Filled 2016-06-12: qty 0.5

## 2016-06-12 NOTE — ED Notes (Signed)
Bed: WA17 Expected date:  Expected time:  Means of arrival:  Comments: rm 1

## 2016-06-12 NOTE — ED Notes (Signed)
No respiratory or acute distress noted resting in bed with eyes closed call light in reach no reaction to medication noted. 

## 2016-06-12 NOTE — ED Notes (Signed)
Pt still confused trying to get out of bed charge nurse Sharrie Rothman RN informed of event.

## 2016-06-12 NOTE — ED Notes (Signed)
Gave EKG to Dr. Canary Brim, MD.

## 2016-06-12 NOTE — ED Notes (Signed)
Asked pt to stay in bed and get out of bed only with help call light in reach no respiratory or acute distress noted alert but confused at times keeps trying to take clothes off no reaction to medication noted.

## 2016-06-12 NOTE — H&P (Signed)
History and Physical  Patient Name: Tiffany Erickson     IHK:[id number]    DOB: [birth year]    DOA: 06/11/2016 PCP: Elgie Collard, MD   Patient coming from: Home via EMS  Chief Complaint: Fall  HPI: Tiffany Erickson is a 46 y.o. female with a past medical history significant for metastatic lung cancer with brain mets, on palliative Opdivo who presents with fall and is found to be delirious.  The patient was initially brought in by EMS after she fell down some stairs at home, couldn't get up, and complained of left hip pain. She can't describe the fall, or the circumstances leading up to it, but doesn't think she lost consciousness, thinks she just fell from being weak.  ED course: -Temp 36F, heart rate 108 initially, respirations 18-20, pulse ox low 90s on room air, BP 105/56 -Na 138, K 3.3, Cr 0.47, WBC 4K, Hgb 10.5 -Radiographs of the hip were negative for fracture -Over the course of her time in the ER, the patient was noted to be disoriented, confused at times about where she was, and sometimes would utter nonsense statements, at other times try to remove leads -She also became much more tachycardic in the ER, so a CT PE protocol was obtained that showed no PE but increased RUL opacification, possibly pneumonia -She was noted to have cough -CT head was obtained that gave limited comparison to previous MRI brain but suggested interval increase in size of mets with worsening edema -TRH were asked to evaluate for delirium     ROS: Review of Systems  Constitutional: Negative for chills and fever.  Respiratory: Positive for cough. Negative for sputum production and shortness of breath.   Neurological: Positive for weakness. Negative for dizziness, speech change, focal weakness, seizures, loss of consciousness and headaches.  Psychiatric/Behavioral: Positive for hallucinations (patient is unaware).          Past Medical History:  Diagnosis Date  . Anxiety   . Broken ribs 05/2015   5  ribs  . Chronic pancreatitis ([place])   . Concussion 06/01/2015   S/P fall-PT STATES IN PHONE INTERVIEW THAT SHE IS UNSURE IF SHE HAD A CONCUSSION OR NOT  . Frequent UTI    "sometimes" (06/01/2015)  . GERD (gastroesophageal reflux disease)   . Lung cancer ([place]) 07/13/15   METASTATIC ADENOCARCINOMA OF THE LUNG  . Lung mass    right side noted 06/01/2015  . Multiple fractures of ribs of right side 06/01/2015   "5" S/P fall  LEFT SIDE  . Shortness of breath dyspnea    5 rib fx right side    Past Surgical History:  Procedure Laterality Date  . ENDOBRONCHIAL ULTRASOUND N/A 07/13/2015   Procedure: ENDOBRONCHIAL ULTRASOUND;  Surgeon: Laverle Hobby, MD;  Location: [facility] ORS;  Service: Pulmonary;  Laterality: N/A;  . ESOPHAGOGASTRODUODENOSCOPY    . EYE MUSCLE SURGERY Left ~ 1979  . MYRINGOTOMY WITH TUBE PLACEMENT Bilateral   . PORTACATH PLACEMENT Left 07/27/2015   Procedure: INSERTION PORT-A-CATH;  Surgeon: [name], MD;  Location: [facility] ORS;  Service: General;  Laterality: Left;  . TONSILLECTOMY      Social History: Patient lives alone.  The patient walks unassisted.  She was in [employer], but is now disabled.  Former smoker.  From [place], went to [place].    Allergies  Allergen Reactions  . Sulfa Antibiotics Other (See Comments)    Unknown-last had as a child    Family history: family history includes CAD in her  maternal grandfather; Colon cancer in her maternal grandfather; Pancreatic cancer in her maternal grandmother.  Prior to Admission medications   Medication Sig Start Date End Date Taking? Authorizing Provider  albuterol (PROVENTIL HFA;VENTOLIN HFA) 108 (90 Base) MCG/ACT inhaler Inhale 2 puffs into the lungs every 6 (six) hours as needed for wheezing or shortness of breath. Patient not taking: Reported on 06/10/2016 03/24/16   Cammie Sickle, MD  ALPRAZolam Duanne Moron) 0.5 MG tablet Take 1 tablet (0.5 mg total) by mouth 3 (three) times daily as needed for  anxiety. 05/19/16   Cammie Sickle, MD  fentaNYL (DURAGESIC - DOSED MCG/HR) 50 MCG/HR Place 1 patch (50 mcg total) onto the skin every 3 (three) days. 06/07/16   Noreene Filbert, MD  HYDROcodone-acetaminophen (NORCO/VICODIN) 5-325 MG tablet Take 1 tablet by mouth every 8 (eight) hours as needed for moderate pain. 05/19/16   Cammie Sickle, MD  lidocaine-prilocaine (EMLA) cream Apply 1 application topically as needed. Patient not taking: Reported on 06/10/2016 12/09/15   Cammie Sickle, MD  metoCLOPramide (REGLAN) 10 MG tablet Take 1 tablet (10 mg total) by mouth every 8 (eight) hours as needed for nausea or vomiting. 02/03/15   [name], MD  nystatin (MYCOSTATIN) 100000 UNIT/ML suspension Take 5 mLs (500,000 Units total) by mouth 4 (four) times daily. Patient not taking: Reported on 06/10/2016 06/10/16   Cammie Sickle, MD  ondansetron (ZOFRAN) 8 MG tablet Take 1 tablet (8 mg total) by mouth every 8 (eight) hours as needed for nausea or vomiting (start 3 days; after chemo). 06/10/16   Cammie Sickle, MD  pantoprazole (PROTONIX) 20 MG tablet Take 20 mg by mouth at bedtime.     Historical Provider, MD  pramipexole (MIRAPEX) 0.5 MG tablet TK 1 T PO QHS 05/23/16   Historical Provider, MD  pregabalin (LYRICA) 75 MG capsule Take 1 capsule (75 mg total) by mouth 2 (two) times daily. 02/03/16   Cammie Sickle, MD  promethazine (PHENERGAN) 25 MG tablet Take 25 mg by mouth every 8 (eight) hours as needed for nausea or vomiting.    Historical Provider, MD  zolpidem (AMBIEN) 5 MG tablet Take 1 tablet (5 mg total) by mouth at bedtime as needed for sleep. 06/10/16   Cammie Sickle, MD       Physical Exam: BP 106/60   Pulse (!) 137   Temp 98.2 F (36.8 C) (Oral)   Resp 17   LMP 06/07/2015   SpO2 94%  General appearance: Adult female, awake and responsive to questions, in no obvious distress.   Eyes: Anicteric, conjunctiva pink, lids and lashes normal. PERRL.    ENT: No nasal  deformity, discharge, epistaxis.  Hearing normal. OP tacky dry without lesions.   Neck: No neck masses.  Trachea midline.  No thyromegaly/tenderness. Lymph: No cervical or supraclavicular lymphadenopathy. Skin: Warm and dry.  No jaundice.  No suspicious rashes or lesions. Cardiac: Tachycardic, regular, nl S1-S2, no murmurs appreciated.  Capillary refill is brisk.  JVP normal.  No LE edema.  Radial and DP pulses 2+ and symmetric. Respiratory: Normal respiratory rate and rhythm.  Occasional wheeze, diminshed throughout. Abdomen: Abdomen soft.  No TTP. No ascites, distension, hepatosplenomegaly.   MSK: No deformities or effusions.  No cyanosis or clubbing.  Diffuse loss of muscle mass. Neuro: Cranial nerves 3-12 intact.  Sensation intact to light touch. Speech is fluent.  Muscle strength 5-/5, symmetric.   Tremor. Psych: Awake and interactive and responding to questions, attention seems normal.  When asked why she is here, states "I fell down those stairs over there" and points out of the room.  When asked if her family live in [place], states, "family is more important than family... I don't know".       Labs on Admission:  I have personally reviewed following labs and imaging studies: CBC:  Recent Labs Lab 06/10/16 0912 06/12/16 0201  WBC 7.8 4.0  NEUTROABS 6.0 2.6  HGB 11.8* 10.5*  HCT 35.0 31.8*  MCV 94.3 96.4  PLT 385 993   Basic Metabolic Panel:  Recent Labs Lab 06/10/16 0912 06/12/16 0201  NA 134* 138  K 3.3* 3.3*  CL 99* 104  CO2 26 25  GLUCOSE 120* 93  BUN 5* 5*  CREATININE 0.63 0.47  CALCIUM 9.3 8.9   GFR: Estimated Creatinine Clearance: 76.9 mL/min (by C-G formula based on SCr of 0.47 mg/dL).  Liver Function Tests: No results for input(s): AST, ALT, ALKPHOS, BILITOT, PROT, ALBUMIN in the last 168 hours. No results for input(s): LIPASE, AMYLASE in the last 168 hours. No results for input(s): AMMONIA in the last 168 hours. Coagulation Profile: No results  for input(s): INR, PROTIME in the last 168 hours. Cardiac Enzymes: No results for input(s): CKTOTAL, CKMB, CKMBINDEX, TROPONINI in the last 168 hours. BNP (last 3 results) No results for input(s): PROBNP in the last 8760 hours. HbA1C: No results for input(s): HGBA1C in the last 72 hours. CBG: No results for input(s): GLUCAP in the last 168 hours. Lipid Profile: No results for input(s): CHOL, HDL, LDLCALC, TRIG, CHOLHDL, LDLDIRECT in the last 72 hours. Thyroid Function Tests: No results for input(s): TSH, T4TOTAL, FREET4, T3FREE, THYROIDAB in the last 72 hours. Anemia Panel: No results for input(s): VITAMINB12, FOLATE, FERRITIN, TIBC, IRON, RETICCTPCT in the last 72 hours. Sepsis Labs: Invalid input(s): PROCALCITONIN, LACTICIDVEN No results found for this or any previous visit (from the past 240 hour(s)).       Radiological Exams on Admission: Personally reviewed X-ray and CT reports: Dg Lumbar Spine Complete  Result Date: 06/11/2016 CLINICAL DATA:  46 year old with current history of metastatic lung cancer who fell down stairs at home earlier this evening and complains of left hip pain. Initial encounter. EXAM: LUMBAR SPINE - COMPLETE 4+ VIEW COMPARISON:  Bone window images from CT abdomen and pelvis 06/01/2015. FINDINGS: Five non-rib-bearing lumbar vertebrae with anatomic alignment. Slight thoracolumbar dextroscoliosis. No fractures. Mild disc space narrowing and endplate hypertrophic changes at L3-4. Remaining disc spaces well-preserved. No pars defects. No significant facet arthropathy. Mild abdominal aortic atherosclerosis without evidence of aneurysm. IMPRESSION: 1. No acute osseous abnormality. 2. Mild degenerative disc disease and spondylosis at L3-4. 3. Mild thoracolumbar dextroscoliosis. 4. Mild abdominal aortic atherosclerosis without evidence of aneurysm. Electronically Signed   By: Evangeline Dakin M.D.   On: 06/11/2016 23:51   Ct Head Wo Contrast  Result Date:  06/12/2016 CLINICAL DATA:  46 y/o F; history of metastatic lung cancer with confusion. EXAM: CT HEAD WITHOUT CONTRAST TECHNIQUE: Contiguous axial images were obtained from the base of the skull through the vertex without intravenous contrast. COMPARISON:  05/17/2016 MRI of the head FINDINGS: Brain: Metastasis within the right occipital lobe, right parafalcine frontal lobe, left parafalcine parietal lobe are increased in size in comparison with the prior MRI given differences in technique for example the right parafalcine frontal lesion measures 18 mm, previously 10 mm measured in a similar fashion (series 2, image 22). Additionally the areas of edema surrounding the metastasis has increased. There  is no evidence for large acute infarct, herniation, or brain parenchymal hemorrhage. Vascular: No hyperdense vessel or unexpected calcification. Skull: Normal. Negative for fracture or focal lesion. Sinuses/Orbits: Mucous retention cyst within the sphenoid sinus. Opacification of the bilateral mastoid air cells similar prior MRI. Orbits are unremarkable. Other: None. IMPRESSION: In comparison with the prior MRI of the brain metastasis in the right frontal lobe, left parietal lobe, and right occipital lobe are increased in size as well as associated edema. Suboptimal evaluation for new metastasis in the absence of intravenous contrast. No evidence for large acute infarct or intracranial hemorrhage. Electronically Signed   By: Kristine Garbe M.D.   On: 06/12/2016 04:30   Ct Angio Chest Pe W And/or Wo Contrast  Result Date: 06/12/2016 CLINICAL DATA:  Confusion and tachycardia EXAM: CT ANGIOGRAPHY CHEST WITH CONTRAST TECHNIQUE: Multidetector CT imaging of the chest was performed using the standard protocol during bolus administration of intravenous contrast. Multiplanar CT image reconstructions and MIPs were obtained to evaluate the vascular anatomy. CONTRAST:  100 mL Isovue 370 intravenous COMPARISON:  CT chest  05/17/2016 FINDINGS: Cardiovascular: Satisfactory opacification of the pulmonary arteries to the segmental level. No evidence of pulmonary embolism. Normal heart size. Small pericardial effusion. Non aneurysmal aorta. No dissection. Mediastinum/Nodes: Stable 9 mm right paratracheal lymph node. Small lymph nodes adjacent to the aortic arch are unchanged. No axillary adenopathy. Trachea and normal. Thyroid normal. Esophagus unremarkable. Lungs/Pleura: Development of small right-sided pleural effusion. Increased consolidation within the apical portion of right upper lobe at the site of prior tumor. Air bronchograms and dilated bronchi are again visualized. Right paramediastinal presumed post radiation fibrotic changes are again identified. Interim finding of mild ground-glass nodular densities in the right lower lobe. Additional scattered foci of ground-glass density in the left upper lobe as well. Upper Abdomen: No acute abnormality in the upper abdomen. Musculoskeletal: No suspicious bone lesion. Review of the MIP images confirms the above findings. IMPRESSION: 1. No CT evidence for acute pulmonary embolus. 2. New small right-sided pleural effusion. Interval increase in consolidation within the apical portion of right upper lobe in the region of previously noted tumor ; it is uncertain if this represents increase in size of pre-existing mass or superimposed infection/pneumonia. 3. 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 Increased ground-glass density in the right lower lobe with scattered foci of ground-glass in the left upper lobe suggests possible infectious or inflammatory process. 4. Stable right peritracheal 9 mm lymph node. Electronically Signed   By: Donavan Foil M.D.   On: 06/12/2016 03:35   Dg Hip Unilat W Or Wo Pelvis 2-3 Views Left  Result Date: 06/11/2016 CLINICAL DATA:  46 year old with current history of metastatic right  upper lobe lung cancer who fell down stairs at home earlier this evening and complains of left hip pain. Initial encounter. EXAM: DG HIP (WITH OR WITHOUT PELVIS) 2-3V LEFT COMPARISON:  Bone window images from CT pelvis 06/01/2015. FINDINGS: No evidence of acute fracture or dislocation. Well-preserved joint space. Well-preserved bone mineral density. Included AP pelvis demonstrates a symmetric normal-appearing contralateral right hip. Sacroiliac joints and symphysis pubis intact. No evidence of osseous metastatic disease. IMPRESSION: Normal examination. Electronically Signed   By: Evangeline Dakin M.D.   On: 06/11/2016 23:49    EKG: Independently reviewed. Rate 135, QTc 461, sinus rhythm.    Assessment/Plan  1. Delirium:  Possibly infection, dehydration or hypokalemia, potentially meds (she is on zolpidem, alprazolam, fentanyl, Lyrica, Ambien, as well  as several nausea medicines). The most concerning thing would be that her delirium is from progression of her brain metastasis.   -IV fluids with K -Treat pneumonia as below -Hold Lyrica, Reglan, phenergan -Sparing use of pain medication, Ambien/alprazolam   2. Suspected community acquired pneumonia:  -Ceftriaxone and azithromycin for 5 days -Obtain flu swab -Check lactic acid, would meet sepsis criteria if elevated  3. Hypokalemia:  -Check mag -IVF with K  4. Lung cancer with brain metastasis:  -Continue fentanyl patch -Continue Norco -Continue alprazolam PRN  5. Normocytic anemia:  Mild.  Near basleine.            DVT prophylaxis: Lovenox  Code Status: FULL; reviewed with patient Family Communication: None present  Disposition Plan: Anticipate IV fluids, IV antibiotics.  Monitor for resolution of delirium Consults called: None Admission status: INPATIENT          Medical decision making: Patient seen at 5:30 AM on 06/12/2016.  The patient was discussed with Antonietta Breach, PA-C.  What exists of the patient's chart  was reviewed in depth and summarized above.  Clinical condition: stable.        Edwin Dada [facility] Pager [phone]      At the time of admission, it appears that the appropriate admission status for this patient is INPATIENT. This is judged to be reasonable and necessary in order to provide the required intensity of service to ensure the patient's safety given the presenting symptoms, physical exam findings, and initial radiographic and laboratory data in the context of their chronic comorbidities.  Together, these circumstances are felt to place her at high risk for further clinical deterioration threatening life, limb, or organ.   Patient requires inpatient status due to high intensity of service, high risk for further deterioration and high frequency of surveillance required because of this acute illness that poses a threat to life, limb or bodily function.  Factors that support inpatient status include: Underlying metastatic lung cancer with brain metastasis Heart rate 130 Hypokalemia New opacification in CXR suggesting pneumonia  I certify that at the point of admission it is my clinical judgment that the patient will require inpatient hospital care spanning beyond 2 midnights from the point of admission and that early discharge would result in unnecessary risk of decompensation and readmission or threat to life, limb or bodily function.

## 2016-06-12 NOTE — ED Notes (Signed)
No respiratory or acute distress noted alert and talking but confused call light in reach informed pt not to get out of bed with staff assistance. Changed pt gown and bed due to urination no skin breakdown noted no reaction to medication noted.

## 2016-06-12 NOTE — Progress Notes (Signed)
Subjective: Patient admitted this morning, see detailed H&P by Dr Loleta Books. 45 y.o. female with a past medical history significant for metastatic lung cancer with brain mets, on palliative Opdivo who presents with fall and is found to be delirious.  The patient was initially brought in by EMS after she fell down some stairs at home, couldn't get up, and complained of left hip pain. She can't describe the fall, or the circumstances leading up to it, but doesn't think she lost consciousness, thinks she just fell from being weak ED course: -Temp 101F, heart rate 108 initially, respirations 18-20, pulse ox low 90s on room air, BP 105/56 -Na 138, K 3.3, Cr 0.47, WBC 4K, Hgb 10.5 -Radiographs of the hip were negative for fracture -Over the course of her time in the ER, the patient was noted to be disoriented, confused at times about where she was, and sometimes would utter nonsense statements, at other times try to remove leads -She also became much more tachycardic in the ER, so a CT PE protocol was obtained that showed no PE but increased RUL opacification, possibly pneumonia -She was noted to have cough -CT head was obtained that gave limited comparison to previous MRI brain but suggested interval increase in size of mets with worsening edema  This morning patient continues to be confused.   Vitals:   06/12/16 1116 06/12/16 1411  BP:  106/69  Pulse: (!) 127 (!) 112  Resp:  20  Temp:  98.5 F (36.9 C)      A/P  Delirium- infectious versus brain metastasis Suspected community-acquired pneumonia Sinus tachycardia Hypokalemia Lung cancer with brain metastasis Normocytic anemia   Plan  Continue empiric antibiotics ceftriaxone and Zithromax Follow blood cultures and urine culture results Start low-dose metoprolol 12.5 mg by mouth twice a day for sinus tachycardia Lipase potassium and check BMP in a.m. Continue fentanyl patch, Norco, alprazolam when necessary     [facility] Pager- [phone]

## 2016-06-13 DIAGNOSIS — R410 Disorientation, unspecified: Secondary | ICD-10-CM

## 2016-06-13 LAB — BASIC METABOLIC PANEL
Anion gap: 10 (ref 5–15)
CO2: 26 mmol/L (ref 22–32)
CREATININE: 0.48 mg/dL (ref 0.44–1.00)
Calcium: 8.8 mg/dL — ABNORMAL LOW (ref 8.9–10.3)
Chloride: 105 mmol/L (ref 101–111)
GFR calc non Af Amer: >60 mL/min (ref >60)
Glucose, Bld: 86 mg/dL (ref 65–99)
POTASSIUM: 3.7 mmol/L (ref 3.5–5.1)
Sodium: 141 mmol/L (ref 135–145)

## 2016-06-13 LAB — CBC
HEMATOCRIT: 28.8 % — AB (ref 36.0–46.0)
Hemoglobin: 9.4 g/dL — ABNORMAL LOW (ref 12.0–15.0)
MCH: 32 pg (ref 26.0–34.0)
MCHC: 32.6 g/dL (ref 30.0–36.0)
MCV: 98 fL (ref 78.0–100.0)
PLATELETS: 391 10*3/uL (ref 150–400)
RBC: 2.94 MIL/uL — AB (ref 3.87–5.11)
RDW: 15.3 % (ref 11.5–15.5)
WBC: 4.6 10*3/uL (ref 4.0–10.5)

## 2016-06-13 LAB — PROCALCITONIN

## 2016-06-13 MED ORDER — DEXAMETHASONE SODIUM PHOSPHATE 4 MG/ML IJ SOLN
4.0000 mg | Freq: Once | INTRAMUSCULAR | Status: AC
  Administered 2016-06-13: 4 mg via INTRAVENOUS
  Filled 2016-06-13: qty 1

## 2016-06-13 MED ORDER — MAGNESIUM SULFATE 2 GM/50ML IV SOLN
2.0000 g | INTRAVENOUS | Status: AC
  Administered 2016-06-13 – 2016-06-14 (×2): 2 g via INTRAVENOUS
  Filled 2016-06-13: qty 50

## 2016-06-13 MED ORDER — DEXAMETHASONE SODIUM PHOSPHATE 4 MG/ML IJ SOLN
4.0000 mg | Freq: Three times a day (TID) | INTRAMUSCULAR | Status: DC
  Administered 2016-06-13 – 2016-06-16 (×10): 4 mg via INTRAVENOUS
  Filled 2016-06-13 (×10): qty 1

## 2016-06-13 NOTE — Evaluation (Signed)
Physical Therapy Evaluation Patient Details Name: Tiffany Erickson MRN: [id number] DOB: [birth year] Today's Date: 06/13/2016   History of Present Illness  46 y.o. female with a past medical history significant for metastatic lung cancer with brain mets, on palliative Opdivo who presents with fall and admitted for delirium and suspected CAP.  Clinical Impression  Pt admitted with above diagnosis. Pt currently with functional limitations due to the deficits listed below (see PT Problem List).  Pt will benefit from skilled PT to increase their independence and safety with mobility to allow discharge to the venue listed below.  Pt with decreased cognition and difficulty following basic commands.  Pt provided with socks and asked to put them on; pt wrapped sock around her foot.  Pt would benefit from 24/7 assist upon d/c.     Follow Up Recommendations SNF;Supervision/Assistance - 24 hour    Equipment Recommendations  Wheelchair cushion (measurements PT);Wheelchair (measurements PT);Hospital bed    Recommendations for Other Services       Precautions / Restrictions Precautions Precautions: Fall Precaution Comments: monitor HR      Mobility  Bed Mobility Overal bed mobility: Needs Assistance Bed Mobility: Supine to Sit;Sit to Supine     Supine to sit: Max assist Sit to supine: Max assist;+2 for physical assistance   General bed mobility comments: multimodal cues provided however pt unable to consistently follow simple commands, SPo2 97% room air, HR up to 130 bpm  Transfers                    Ambulation/Gait                Stairs            Wheelchair Mobility    Modified Rankin (Stroke Patients Only)       Balance Overall balance assessment: History of Falls;Needs assistance Sitting-balance support: Feet supported;Single extremity supported Sitting balance-Leahy Scale: Poor                                       Pertinent Vitals/Pain  Pain Assessment: No/denies pain    Home Living Family/patient expects to be discharged to:: Skilled nursing facility Living Arrangements: Alone                    Prior Function Level of Independence: Independent               Hand Dominance        Extremity/Trunk Assessment        Lower Extremity Assessment Lower Extremity Assessment: RLE deficits/detail;LLE deficits/detail RLE Deficits / Details: full AROM however poor motor control bilaterally RLE Coordination: decreased gross motor LLE Coordination: decreased gross motor       Communication   Communication: Receptive difficulties;Expressive difficulties  Cognition Arousal/Alertness: Awake/alert Behavior During Therapy: Flat affect Overall Cognitive Status: No family/caregiver present to determine baseline cognitive functioning                 General Comments: pt presents with cognitive impairments, unable to consistently follow commands, decreased processing    General Comments      Exercises     Assessment/Plan    PT Assessment Patient needs continued PT services  PT Problem List Decreased strength;Decreased balance;Decreased activity tolerance;Decreased mobility;Decreased coordination;Decreased cognition;Decreased safety awareness          PT Treatment Interventions DME instruction;Gait training;Functional mobility training;Therapeutic activities;Therapeutic  exercise;Patient/family education;Balance training    PT Goals (Current goals can be found in the Care Plan section)  Acute Rehab PT Goals PT Goal Formulation: Patient unable to participate in goal setting Time For Goal Achievement: 06/27/16 Potential to Achieve Goals: Good    Frequency Min 3X/week   Barriers to discharge        Co-evaluation               End of Session Equipment Utilized During Treatment: Gait belt Activity Tolerance: Patient limited by fatigue Patient left: in bed;with call bell/phone within  reach;with bed alarm set Nurse Communication: Mobility status         Time: 3361-2244 PT Time Calculation (min) (ACUTE ONLY): 17 min   Charges:   PT Evaluation $PT Eval Moderate Complexity: 1 Procedure     PT G Codes:        Khushbu Pippen,KATHrine E 06/13/2016, 12:56 PM Carmelia Bake, PT, DPT 06/13/2016 Pager: [phone]

## 2016-06-13 NOTE — Plan of Care (Signed)
Problem: Safety: Goal: Ability to remain free from injury will improve Outcome: Completed/Met Date Met: 06/13/16 Discussed safety prevention precaution plan with pt's mother and pt. Bed alarm is set. room is close to nurses station

## 2016-06-13 NOTE — Plan of Care (Signed)
Problem: Education: Goal: Knowledge of [facility] Education information/materials will improve Outcome: Completed/Met Date Met: 06/13/16 Provider discussed pl;an of care with patients mother. Mother verbalized understanding

## 2016-06-13 NOTE — Progress Notes (Signed)
PROGRESS NOTE  Tiffany PELLECCHIA  XNA:[id number] DOB: [birth year] DOA: 06/11/2016 PCP: Elgie Collard, MD  Outpatient Specialists: Dr. Rogue Bussing, oncology  Brief Narrative: Tiffany Erickson is a 46 y.o. female with a past medical history significant for metastatic lung cancer with brain mets, on palliative Opdivo who presents with fall and is found to be delirious.  The patient was initially brought in by EMS after she fell down some stairs at home, couldn't get up, and complained of left hip pain. She can't describe the fall, or the circumstances leading up to it, but doesn't think she lost consciousness, thinks she just fell from being weak. She was consistently confused during her time in the ED and continues to be less oriented from baseline per her mother. Radiographs of the hip were negative for fracture. She also became much more tachycardic in the ER, so a CT PE protocol was obtained that showed no PE but increased RUL opacification, possibly pneumonia. CT head was obtained that gave limited comparison to previous MRI brain but suggested interval increase in size of mets with worsening edema. TRH were asked to evaluate for delirium. Case was discussed with her oncologist, Dr. Rogue Bussing 1/8 who suggested steroids and palliative care consult.  Assessment & Plan: Principal Problem:   Delirium Active Problems:   Malignant neoplasm of right upper lobe of lung (HCC)   Hypokalemia   Brain metastasis (HCC)   Normocytic anemia  Delirium vs. subacute encephalopathy: Time course per mother is slowly worsening ability to concentrate which is relatively fixed/consistent. Suspect primarily due to enlarging brain mets w/associated edema. Other contributions from infection, dehydration or hypokalemia, potentially meds (she is on zolpidem, alprazolam, fentanyl, Lyrica, Ambien, as well as several nausea medicines).  - Started dexamethasone '4mg'$  IV q8h. - Continue supportive care with IVF, correcting  electrolytes, limiting sedating medications, treating possible pneumonia - Hold Lyrica, Reglan, phenergan - Sparing use of pain medication, Ambien/alprazolam - PT/OT consulted: recommending SNF at patient's current functional status.  Suspected community acquired pneumonia: Tachycardic but has remained afebrile. Lactate 0.8, PCT <0.10, influenza negative. - Ceftriaxone and azithromycin. DC if PCT/cultures remain negative. - Monitor blood cultures  Sinus tachycardia: Confirmed by ECG. Possibly related to cerebral inflammation, as evidence for pneumonia is limited.  - Treat underlying conditions with steroids/abx - Appears euvolemic, not hypotensive. Consider fluid challenge if not improving. - Begin trial metoprolol succinate '25mg'$  po daily  Hypokalemia with hypomagnesemia: Suspect ?poor po. - Replete magnesium - Recheck Mg, K in AM  Lung cancer with brain metastases: Discussed with oncologist, Dr. Rogue Bussing.  - Broached incurable nature with mother and patient. Dr. Rogue Bussing and I would like palliative care assistance with this.  - Continue fentanyl patch - Continue Norco - Continue alprazolam PRN  Normocytic anemia: Mild.  Near basleine. Related to cancer. - Monitor  DVT prophylaxis: Lovenox Code Status: Full, confirmed with mother. Family Communication: Mother at bedside Disposition Plan: Continue IV steroids. Will continue PT evaluations, currently recommending SNF and admitted with significant falling so may require ST rehab.  Consultants:   Palliative care team  Procedures:   None  Antimicrobials:  Ceftriaxone 1/6 >>   Azithromycin 1/6 >>    Subjective: Pt confused, denies pain, oriented to hospital but not which nor time.  Objective: Vitals:   06/12/16 1411 06/13/16 0605 06/13/16 1008 06/13/16 1300  BP: 106/69 115/67 123/68 118/62  Pulse: (!) 112 (!) 123 (!) 121 (!) 115  Resp: '20 18  18  '$ Temp: 98.5 F (36.9 C) 98.2  F (36.8 C)  98.4 F (36.9 C)    TempSrc: Oral Oral  Oral  SpO2: 94% 98%  98%  Weight:      Height:        Intake/Output Summary (Last 24 hours) at 06/13/16 1950 Last data filed at 06/13/16 1400  Gross per 24 hour  Intake             2360 ml  Output                0 ml  Net             2360 ml   Filed Weights   06/12/16 2423  Weight: 61.2 kg (134 lb 14.7 oz)    Examination: General exam: 46 y.o. female in no distress Respiratory system: Non-labored breathing room air. Clear to auscultation bilaterally.  Cardiovascular system: Tachycardic rate and regular rhythm. No murmur, rub, or gallop. No JVD, and no pedal edema. Gastrointestinal system: Abdomen soft, non-tender, non-distended, with normoactive bowel sounds. No organomegaly or masses felt. Central nervous system: Alert, oriented to person and "hospital". No focal neurological deficits, but diffusely weak.  Extremities: Warm, no deformities Skin: No rashes, lesions no ulcers Psychiatry: Judgement and insight appear normal. Mood & affect appropriate.   Data Reviewed: I have personally reviewed following labs and imaging studies  CBC:  Recent Labs Lab 06/10/16 0912 06/12/16 0201 06/13/16 0522  WBC 7.8 4.0 4.6  NEUTROABS 6.0 2.6  --   HGB 11.8* 10.5* 9.4*  HCT 35.0 31.8* 28.8*  MCV 94.3 96.4 98.0  PLT 385 398 536   Basic Metabolic Panel:  Recent Labs Lab 06/10/16 0912 06/12/16 0201 06/12/16 0831 06/13/16 0522  NA 134* 138  --  141  K 3.3* 3.3*  --  3.7  CL 99* 104  --  105  CO2 26 25  --  26  GLUCOSE 120* 93  --  86  BUN 5* 5*  --  <5*  CREATININE 0.63 0.47  --  0.48  CALCIUM 9.3 8.9  --  8.8*  MG  --   --  1.3*  --    GFR: Estimated Creatinine Clearance: 77.2 mL/min (by C-G formula based on SCr of 0.48 mg/dL). Liver Function Tests: No results for input(s): AST, ALT, ALKPHOS, BILITOT, PROT, ALBUMIN in the last 168 hours. No results for input(s): LIPASE, AMYLASE in the last 168 hours. No results for input(s): AMMONIA in the last 168  hours. Coagulation Profile: No results for input(s): INR, PROTIME in the last 168 hours. Cardiac Enzymes: No results for input(s): CKTOTAL, CKMB, CKMBINDEX, TROPONINI in the last 168 hours. BNP (last 3 results) No results for input(s): PROBNP in the last 8760 hours. HbA1C: No results for input(s): HGBA1C in the last 72 hours. CBG: No results for input(s): GLUCAP in the last 168 hours. Lipid Profile: No results for input(s): CHOL, HDL, LDLCALC, TRIG, CHOLHDL, LDLDIRECT in the last 72 hours. Thyroid Function Tests: No results for input(s): TSH, T4TOTAL, FREET4, T3FREE, THYROIDAB in the last 72 hours. Anemia Panel: No results for input(s): VITAMINB12, FOLATE, FERRITIN, TIBC, IRON, RETICCTPCT in the last 72 hours. Urine analysis:    Component Value Date/Time   COLORURINE YELLOW 02/03/2015 0329   APPEARANCEUR CLOUDY (A) 02/03/2015 0329   APPEARANCEUR Clear 09/25/2014 2254   LABSPEC 1.003 (L) 02/03/2015 0329   LABSPEC 1.001 09/25/2014 2254   PHURINE 6.5 02/03/2015 0329   GLUCOSEU NEGATIVE 02/03/2015 0329   GLUCOSEU Negative 09/25/2014 2254   HGBUR NEGATIVE  02/03/2015 0329   BILIRUBINUR NEGATIVE 02/03/2015 0329   BILIRUBINUR Negative 09/25/2014 2254   KETONESUR NEGATIVE 02/03/2015 0329   PROTEINUR NEGATIVE 02/03/2015 0329   UROBILINOGEN 0.2 02/03/2015 0329   NITRITE NEGATIVE 02/03/2015 0329   LEUKOCYTESUR NEGATIVE 02/03/2015 0329   LEUKOCYTESUR Negative 09/25/2014 2254   Sepsis Labs: '@LABRCNTIP'$ (procalcitonin:4,lacticidven:4)  ) Recent Results (from the past 240 hour(s))  Culture, blood (single)     Status: None (Preliminary result)   Collection Time: 06/12/16  8:31 AM  Result Value Ref Range Status   Specimen Description BLOOD RIGHT ARM  Final   Special Requests IN PEDIATRIC BOTTLE [place]  Final   Culture   Final    NO GROWTH 1 DAY Performed at [facility]    Report Status PENDING  Incomplete     Radiology Studies: Dg Lumbar Spine Complete  Result Date:  06/11/2016 CLINICAL DATA:  46 year old with current history of metastatic lung cancer who fell down stairs at home earlier this evening and complains of left hip pain. Initial encounter. EXAM: LUMBAR SPINE - COMPLETE 4+ VIEW COMPARISON:  Bone window images from CT abdomen and pelvis 06/01/2015. FINDINGS: Five non-rib-bearing lumbar vertebrae with anatomic alignment. Slight thoracolumbar dextroscoliosis. No fractures. Mild disc space narrowing and endplate hypertrophic changes at L3-4. Remaining disc spaces well-preserved. No pars defects. No significant facet arthropathy. Mild abdominal aortic atherosclerosis without evidence of aneurysm. IMPRESSION: 1. No acute osseous abnormality. 2. Mild degenerative disc disease and spondylosis at L3-4. 3. Mild thoracolumbar dextroscoliosis. 4. Mild abdominal aortic atherosclerosis without evidence of aneurysm. Electronically Signed   By: Evangeline Dakin M.D.   On: 06/11/2016 23:51   Ct Head Wo Contrast  Result Date: 06/12/2016 CLINICAL DATA:  46 y/o F; history of metastatic lung cancer with confusion. EXAM: CT HEAD WITHOUT CONTRAST TECHNIQUE: Contiguous axial images were obtained from the base of the skull through the vertex without intravenous contrast. COMPARISON:  05/17/2016 MRI of the head FINDINGS: Brain: Metastasis within the right occipital lobe, right parafalcine frontal lobe, left parafalcine parietal lobe are increased in size in comparison with the prior MRI given differences in technique for example the right parafalcine frontal lesion measures 18 mm, previously 10 mm measured in a similar fashion (series 2, image 22). Additionally the areas of edema surrounding the metastasis has increased. There is no evidence for large acute infarct, herniation, or brain parenchymal hemorrhage. Vascular: No hyperdense vessel or unexpected calcification. Skull: Normal. Negative for fracture or focal lesion. Sinuses/Orbits: Mucous retention cyst within the sphenoid sinus.  Opacification of the bilateral mastoid air cells similar prior MRI. Orbits are unremarkable. Other: None. IMPRESSION: In comparison with the prior MRI of the brain metastasis in the right frontal lobe, left parietal lobe, and right occipital lobe are increased in size as well as associated edema. Suboptimal evaluation for new metastasis in the absence of intravenous contrast. No evidence for large acute infarct or intracranial hemorrhage. Electronically Signed   By: Kristine Garbe M.D.   On: 06/12/2016 04:30   Ct Angio Chest Pe W And/or Wo Contrast  Result Date: 06/12/2016 CLINICAL DATA:  Confusion and tachycardia EXAM: CT ANGIOGRAPHY CHEST WITH CONTRAST TECHNIQUE: Multidetector CT imaging of the chest was performed using the standard protocol during bolus administration of intravenous contrast. Multiplanar CT image reconstructions and MIPs were obtained to evaluate the vascular anatomy. CONTRAST:  100 mL Isovue 370 intravenous COMPARISON:  CT chest 05/17/2016 FINDINGS: Cardiovascular: Satisfactory opacification of the pulmonary arteries to the segmental level. No evidence of pulmonary embolism.  Normal heart size. Small pericardial effusion. Non aneurysmal aorta. No dissection. Mediastinum/Nodes: Stable 9 mm right paratracheal lymph node. Small lymph nodes adjacent to the aortic arch are unchanged. No axillary adenopathy. Trachea and normal. Thyroid normal. Esophagus unremarkable. Lungs/Pleura: Development of small right-sided pleural effusion. Increased consolidation within the apical portion of right upper lobe at the site of prior tumor. Air bronchograms and dilated bronchi are again visualized. Right paramediastinal presumed post radiation fibrotic changes are again identified. Interim finding of mild ground-glass nodular densities in the right lower lobe. Additional scattered foci of ground-glass density in the left upper lobe as well. Upper Abdomen: No acute abnormality in the upper abdomen.  Musculoskeletal: No suspicious bone lesion. Review of the MIP images confirms the above findings. IMPRESSION: 1. No CT evidence for acute pulmonary embolus. 2. New small right-sided pleural effusion. Interval increase in consolidation within the apical portion of right upper lobe in the region of previously noted tumor ; it is uncertain if this represents increase in size of pre-existing mass or superimposed infection/pneumonia. 3. 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 Increased ground-glass density in the right lower lobe with scattered foci of ground-glass in the left upper lobe suggests possible infectious or inflammatory process. 4. Stable right peritracheal 9 mm lymph node. Electronically Signed   By: Donavan Foil M.D.   On: 06/12/2016 03:35   Dg Hip Unilat W Or Wo Pelvis 2-3 Views Left  Result Date: 06/11/2016 CLINICAL DATA:  46 year old with current history of metastatic right upper lobe lung cancer who fell down stairs at home earlier this evening and complains of left hip pain. Initial encounter. EXAM: DG HIP (WITH OR WITHOUT PELVIS) 2-3V LEFT COMPARISON:  Bone window images from CT pelvis 06/01/2015. FINDINGS: No evidence of acute fracture or dislocation. Well-preserved joint space. Well-preserved bone mineral density. Included AP pelvis demonstrates a symmetric normal-appearing contralateral right hip. Sacroiliac joints and symphysis pubis intact. No evidence of osseous metastatic disease. IMPRESSION: Normal examination. Electronically Signed   By: Evangeline Dakin M.D.   On: 06/11/2016 23:49    Scheduled Meds: . azithromycin  500 mg Oral Q24H  . cefTRIAXone (ROCEPHIN)  IV  1 g Intravenous Q24H  . dexamethasone  4 mg Intravenous Q8H  . enoxaparin (LOVENOX) injection  40 mg Subcutaneous Q24H  . fentaNYL  50 mcg Transdermal Q72H  . Influenza vac split quadrivalent PF  0.5 mL Intramuscular Tomorrow-1000  .  metoprolol tartrate  12.5 mg Oral BID  . pneumococcal 23 valent vaccine  0.5 mL Intramuscular Tomorrow-1000  . sodium chloride flush  10-40 mL Intracatheter Q12H  . sodium chloride flush  3 mL Intravenous Q12H   Continuous Infusions: . 0.9 % NaCl with KCl 20 mEq / L 100 mL/hr at 06/13/16 1501     LOS: 1 day   Time spent: 35 minutes spent discussing conditions and plan of care with patient and family at the bedside.  Vance Gather, MD [facility] Pager [phone]  If 7PM-7AM, please contact night-coverage [email or web address] Password [email or web address] 06/13/2016, 7:50 PM

## 2016-06-14 LAB — PROCALCITONIN

## 2016-06-14 LAB — MAGNESIUM: Magnesium: 1.9 mg/dL (ref 1.7–2.4)

## 2016-06-14 LAB — CBC
HEMATOCRIT: 31.5 % — AB (ref 36.0–46.0)
HEMOGLOBIN: 10.4 g/dL — AB (ref 12.0–15.0)
MCH: 30.9 pg (ref 26.0–34.0)
MCHC: 33 g/dL (ref 30.0–36.0)
MCV: 93.5 fL (ref 78.0–100.0)
Platelets: 427 10*3/uL — ABNORMAL HIGH (ref 150–400)
RBC: 3.37 MIL/uL — ABNORMAL LOW (ref 3.87–5.11)
RDW: 14.5 % (ref 11.5–15.5)
WBC: 3.2 10*3/uL — AB (ref 4.0–10.5)

## 2016-06-14 LAB — BASIC METABOLIC PANEL
ANION GAP: 14 (ref 5–15)
BUN: 6 mg/dL (ref 6–20)
CO2: 21 mmol/L — ABNORMAL LOW (ref 22–32)
Calcium: 8.4 mg/dL — ABNORMAL LOW (ref 8.9–10.3)
Chloride: 100 mmol/L — ABNORMAL LOW (ref 101–111)
Creatinine, Ser: 0.4 mg/dL — ABNORMAL LOW (ref 0.44–1.00)
GFR calc Af Amer: >60 mL/min (ref >60)
Glucose, Bld: 126 mg/dL — ABNORMAL HIGH (ref 65–99)
POTASSIUM: 4 mmol/L (ref 3.5–5.1)
SODIUM: 135 mmol/L (ref 135–145)

## 2016-06-14 MED ORDER — METOPROLOL TARTRATE 25 MG PO TABS
25.0000 mg | ORAL_TABLET | Freq: Two times a day (BID) | ORAL | Status: DC
  Administered 2016-06-14 – 2016-06-16 (×5): 25 mg via ORAL
  Filled 2016-06-14 (×5): qty 1

## 2016-06-14 NOTE — Progress Notes (Signed)
PROGRESS NOTE  Tiffany Erickson  WRU:[id number] DOB: 23-Apr-1971 DOA: 06/11/2016 PCP: Elgie Collard, MD  Outpatient Specialists: Dr. Rogue Bussing, oncology  Brief Narrative: Tiffany Erickson is a 46 y.o. female with a past medical history significant for metastatic lung cancer with brain mets, on palliative Opdivo who presents with fall and is found to be delirious.  The patient was initially brought in by EMS after she fell down some stairs at home, couldn't get up, and complained of left hip pain. She can't describe the fall, or the circumstances leading up to it, but doesn't think she lost consciousness, thinks she just fell from being weak. She was consistently confused during her time in the ED and continues to be less oriented from baseline per her mother. Radiographs of the hip were negative for fracture. She also became much more tachycardic in the ER, so a CT PE protocol was obtained that showed no PE but increased RUL opacification, possibly pneumonia. CT head was obtained that gave limited comparison to previous MRI brain but suggested interval increase in size of mets with worsening edema. TRH were asked to evaluate for delirium. Case was discussed with her oncologist, Dr. Rogue Bussing 1/8 who suggested steroids and palliative care consult.  Assessment & Plan: Principal Problem:   Delirium Active Problems:   Malignant neoplasm of right upper lobe of lung (HCC)   Hypokalemia   Brain metastasis (HCC)   Normocytic anemia  Delirium vs. subacute encephalopathy: Time course per mother is slowly worsening ability to concentrate which is relatively fixed/consistent. Suspect primarily due to enlarging brain mets w/associated edema. Other contributions from infection, dehydration or hypokalemia, potentially meds (she is on zolpidem, alprazolam, fentanyl, Lyrica, Ambien, as well as several nausea medicines).  - Started dexamethasone '4mg'$  IV q8h 1/8. - Continue supportive care with IVF, correcting  electrolytes, limiting sedating medications, treating possible pneumonia - Hold Lyrica, Reglan, phenergan - Sparing use of pain medication, Ambien/alprazolam - PT/OT consulted: recommending SNF at patient's current functional status. Will likely go home with hospice.  Possible community acquired pneumonia: Tachycardic but has remained afebrile. Lactate 0.8, PCT <0.10, influenza negative. - Ceftriaxone and azithromycin. DC abx since PCT still < 0.10 and cultures negative - Monitor blood cultures  Sinus tachycardia: Confirmed by ECG. Possibly related to cerebral inflammation, as evidence for pneumonia is limited.  - Treat possible underlying condition with steroids - Appears euvolemic, not hypotensive. Consider fluid challenge if not improving. - Begin trial metoprolol tartrate '25mg'$  po BID  Hypokalemia with hypomagnesemia: Suspect ?poor po. Resolved s/p repletion - Recheck Mg, K   Lung cancer with brain metastases: Discussed with oncologist, Dr. Rogue Bussing.  - Appreciate palliative care input. Pt is DNR, will likely discharge to hospice at home depending on clinical course. - Continue fentanyl patch - Continue Norco - Continue alprazolam PRN  Normocytic anemia: Mild.  Near basleine. Related to cancer. - Monitor  DVT prophylaxis: Lovenox Code Status: Full, confirmed with mother. Family Communication: Mother at bedside Disposition Plan: Continue IV steroids x 3 days. Anticipate discharge home with hospice 1/11  Consultants:   Palliative care team  Procedures:   None  Antimicrobials:  Ceftriaxone 1/6 >> 1/8  Azithromycin 1/6 >> 1/8  Subjective: Pt confused, denies pain, oriented to hospital but not which nor time. Cannot follow simple commands.  Objective: Vitals:   06/13/16 1008 06/13/16 1300 06/13/16 2310 06/14/16 0521  BP: 123/68 118/62 115/74 128/78  Pulse: (!) 121 (!) 115 (!) 124 (!) 111  Resp:  18 18 18  Temp:  98.4 F (36.9 C) 98.1 F (36.7 C) 97.7 F  (36.5 C)  TempSrc:  Oral Oral Oral  SpO2:  98% 97% 98%  Weight:      Height:        Intake/Output Summary (Last 24 hours) at 06/14/16 7893 Last data filed at 06/14/16 8101  Gross per 24 hour  Intake             2410 ml  Output                0 ml  Net             2410 ml   Filed Weights   06/12/16 7510  Weight: 61.2 kg (134 lb 14.7 oz)    Examination: General exam: 46 y.o. female in no distress Respiratory system: Non-labored breathing room air. Clear to auscultation bilaterally.  Cardiovascular system: Tachycardic rate and regular rhythm. No murmur, rub, or gallop. No JVD, and no pedal edema. Gastrointestinal system: Abdomen soft, non-tender, non-distended, with normoactive bowel sounds. No organomegaly or masses felt. Central nervous system: Alert, oriented to person and "hospital". No focal neurological deficits, but diffusely weak.  Extremities: Warm, no deformities Skin: No rashes, lesions no ulcers Psychiatry: Judgement and insight appear normal. Mood & affect appropriate.   Data Reviewed: I have personally reviewed following labs and imaging studies  CBC:  Recent Labs Lab 06/10/16 0912 06/12/16 0201 06/13/16 0522 06/14/16 0615  WBC 7.8 4.0 4.6 3.2*  NEUTROABS 6.0 2.6  --   --   HGB 11.8* 10.5* 9.4* 10.4*  HCT 35.0 31.8* 28.8* 31.5*  MCV 94.3 96.4 98.0 93.5  PLT 385 398 391 258*   Basic Metabolic Panel:  Recent Labs Lab 06/10/16 0912 06/12/16 0201 06/12/16 0831 06/13/16 0522 06/14/16 0615  NA 134* 138  --  141 135  K 3.3* 3.3*  --  3.7 4.0  CL 99* 104  --  105 100*  CO2 26 25  --  26 21*  GLUCOSE 120* 93  --  86 126*  BUN 5* 5*  --  <5* 6  CREATININE 0.63 0.47  --  0.48 0.40*  CALCIUM 9.3 8.9  --  8.8* 8.4*  MG  --   --  1.3*  --  1.9   GFR: Estimated Creatinine Clearance: 77.2 mL/min (by C-G formula based on SCr of 0.4 mg/dL (L)). Liver Function Tests: No results for input(s): AST, ALT, ALKPHOS, BILITOT, PROT, ALBUMIN in the last 168  hours. No results for input(s): LIPASE, AMYLASE in the last 168 hours. No results for input(s): AMMONIA in the last 168 hours. Coagulation Profile: No results for input(s): INR, PROTIME in the last 168 hours. Cardiac Enzymes: No results for input(s): CKTOTAL, CKMB, CKMBINDEX, TROPONINI in the last 168 hours. BNP (last 3 results) No results for input(s): PROBNP in the last 8760 hours. HbA1C: No results for input(s): HGBA1C in the last 72 hours. CBG: No results for input(s): GLUCAP in the last 168 hours. Lipid Profile: No results for input(s): CHOL, HDL, LDLCALC, TRIG, CHOLHDL, LDLDIRECT in the last 72 hours. Thyroid Function Tests: No results for input(s): TSH, T4TOTAL, FREET4, T3FREE, THYROIDAB in the last 72 hours. Anemia Panel: No results for input(s): VITAMINB12, FOLATE, FERRITIN, TIBC, IRON, RETICCTPCT in the last 72 hours. Urine analysis:    Component Value Date/Time   COLORURINE YELLOW 02/03/2015 0329   APPEARANCEUR CLOUDY (A) 02/03/2015 0329   APPEARANCEUR Clear 09/25/2014 2254   LABSPEC 1.003 (L) 02/03/2015 5277  LABSPEC 1.001 09/25/2014 2254   PHURINE 6.5 02/03/2015 0329   GLUCOSEU NEGATIVE 02/03/2015 0329   GLUCOSEU Negative 09/25/2014 2254   HGBUR NEGATIVE 02/03/2015 0329   BILIRUBINUR NEGATIVE 02/03/2015 0329   BILIRUBINUR Negative 09/25/2014 2254   KETONESUR NEGATIVE 02/03/2015 0329   PROTEINUR NEGATIVE 02/03/2015 0329   UROBILINOGEN 0.2 02/03/2015 0329   NITRITE NEGATIVE 02/03/2015 0329   LEUKOCYTESUR NEGATIVE 02/03/2015 0329   LEUKOCYTESUR Negative 09/25/2014 2254   Sepsis Labs: '@LABRCNTIP'$ (procalcitonin:4,lacticidven:4)  ) Recent Results (from the past 240 hour(s))  Culture, blood (single)     Status: None (Preliminary result)   Collection Time: 06/12/16  8:31 AM  Result Value Ref Range Status   Specimen Description BLOOD RIGHT ARM  Final   Special Requests IN PEDIATRIC BOTTLE [place]  Final   Culture   Final    NO GROWTH 1 DAY Performed at [facility]    Report Status PENDING  Incomplete     Radiology Studies: No results found.  Scheduled Meds: . cefTRIAXone (ROCEPHIN)  IV  1 g Intravenous Q24H  . dexamethasone  4 mg Intravenous Q8H  . enoxaparin (LOVENOX) injection  40 mg Subcutaneous Q24H  . fentaNYL  50 mcg Transdermal Q72H  . Influenza vac split quadrivalent PF  0.5 mL Intramuscular Tomorrow-1000  . metoprolol tartrate  25 mg Oral BID  . pneumococcal 23 valent vaccine  0.5 mL Intramuscular Tomorrow-1000  . sodium chloride flush  10-40 mL Intracatheter Q12H  . sodium chloride flush  3 mL Intravenous Q12H   Continuous Infusions: . 0.9 % NaCl with KCl 20 mEq / L 100 mL/hr at 06/13/16 2303     LOS: 2 days   Time spent: 25 minutes  Vance Gather, MD [facility] Pager [phone]  If 7PM-7AM, please contact night-coverage [email or web address] Password [facility] 06/14/2016, 9:38 AM

## 2016-06-14 NOTE — Consult Note (Signed)
Consultation Note Date: 06/14/2016   Patient Name: Tiffany Erickson  DOB: [birth year]  MRN: [id number]  Age / Sex: 46 y.o., female  PCP: Elgie Collard, MD Referring Physician: Patrecia Pour, MD  Reason for Consultation: Establishing goals of care  HPI/Patient Profile: 46 y.o. female  with past medical history of  Metastatic lung cancer with brain mets admitted on 06/11/2016 with fall, delirium.   Clinical Assessment and Goals of Care:   46 yo lady who lives at home with her mother, has history of metastatic lung cancer with brain mets, patient recently followed up with her oncologist, was noted to have ongoing functional status decline. She was on palliative Opdivo.   Patient has been admitted with delirium vs sub acute encephalopathy, she is noted to have enlarging brain mets with associated edema. Additionally, she is also being treated for community acquired PNA. Palliative consult placed for goals of care discussions.   The patient is resting in bed, she is confused. She is awake, tracks me in the room, she is aware that she is in a hospital, she can state her name. She does not know why she is in the hospital. She initially did not know where she lives, she stated that she lives alone, in [place], [place].   Later today, patient's mother arrived at the bedside. I introduced myself and palliative care as follows: Palliative medicine is specialized medical care for people living with serious illness. It focuses on providing relief from the symptoms and stress of a serious illness. The goal is to improve quality of life for both the patient and the family.  Patient's mother is tearful. She states that she used to be a Psychologist, occupational with [facility] for more than 10 years. Mother is asking about arranging for hospice services at home.   I discussed the patient's current condition, disease trajectory with  rapidly enlarging brain mets. Patient's mother wants to take her home, does not want patient to go to SNF. Patient's mother's sister is coming up from [place] to be with them and to help the patient out.   We discussed about hospice philosophy being comfort at end of life, discussed prognosis could be as short as 2-3 weeks. Code status discussions undertaken, now established as DNR DNI.   I have requested case management to discuss hospice arrangements with patient's mother. All of her questions answered to the best of my ability. Thank you for the consult.   NEXT OF KIN  no spouse, no kids Mother is primary care giver  SUMMARY OF RECOMMENDATIONS    DNR DNI Home with Hospice Continue current treatment, palliative service will continue to follow along.   Code Status/Advance Care Planning:  DNR    Symptom Management:     As above, agree with steroids  Palliative Prophylaxis:   Bowel Regimen  Additional Recommendations (Limitations, Scope, Preferences):  Full Comfort Care  Psycho-social/Spiritual:   Desire for further Chaplaincy support:yes  Additional Recommendations: Education on Hospice  Prognosis:   < 4 weeks  Discharge Planning: Home with Hospice      Primary Diagnoses: Present on Admission: . Delirium . Malignant neoplasm of right upper lobe of lung ([place]) . Brain metastasis ([place]) . Hypokalemia . Normocytic anemia   I have reviewed the medical record, interviewed the patient and family, and examined the patient. The following aspects are pertinent.  Past Medical History:  Diagnosis Date  . Anxiety   . Broken ribs 05/2015   5 ribs  . Chronic pancreatitis ([place])   . Concussion 06/01/2015   S/P fall-PT STATES IN PHONE INTERVIEW THAT SHE IS UNSURE IF SHE HAD A CONCUSSION OR NOT  . Frequent UTI    "sometimes" (06/01/2015)  . GERD (gastroesophageal reflux disease)   . Lung cancer ([place]) 07/13/15   METASTATIC ADENOCARCINOMA OF THE LUNG  . Lung mass     right side noted 06/01/2015  . Multiple fractures of ribs of right side 06/01/2015   "5" S/P fall  LEFT SIDE  . Shortness of breath dyspnea    5 rib fx right side   Social History   Social History  . Marital status: Single    Spouse name: N/A  . Number of children: N/A  . Years of education: N/A   Social History Main Topics  . Smoking status: Current Every Day Smoker    Packs/day: 0.50    Years: 28.00    Types: Cigarettes  . Smokeless tobacco: Never Used  . Alcohol use 8.4 oz/week    2 Glasses of wine, 12 Cans of beer per week     Comment: 06/01/2015 "drink probably 4 times/wk; either ;4 beers or 2 glasses of wine when I do drink"  . Drug use: No  . Sexual activity: Not Currently   Other Topics Concern  . None   Social History Narrative  . None   Family History  Problem Relation Age of Onset  . Colon cancer Maternal Grandfather   . CAD Maternal Grandfather   . Pancreatic cancer Maternal Grandmother    Scheduled Meds: . dexamethasone  4 mg Intravenous Q8H  . enoxaparin (LOVENOX) injection  40 mg Subcutaneous Q24H  . fentaNYL  50 mcg Transdermal Q72H  . Influenza vac split quadrivalent PF  0.5 mL Intramuscular Tomorrow-1000  . metoprolol tartrate  25 mg Oral BID  . pneumococcal 23 valent vaccine  0.5 mL Intramuscular Tomorrow-1000  . sodium chloride flush  10-40 mL Intracatheter Q12H  . sodium chloride flush  3 mL Intravenous Q12H   Continuous Infusions: . 0.9 % NaCl with KCl 20 mEq / L 100 mL/hr at 06/14/16 1142   PRN Meds:.acetaminophen **OR** acetaminophen, ALPRAZolam, HYDROcodone-acetaminophen, ondansetron **OR** ondansetron (ZOFRAN) IV, senna-docusate, sodium chloride flush, zolpidem Medications Prior to Admission:  Prior to Admission medications   Medication Sig Start Date End Date Taking? Authorizing Provider  albuterol (PROVENTIL HFA;VENTOLIN HFA) 108 (90 Base) MCG/ACT inhaler Inhale 2 puffs into the lungs every 6 (six) hours as needed for wheezing or  shortness of breath. 03/24/16  Yes Cammie Sickle, MD  ALPRAZolam Duanne Moron) 0.5 MG tablet Take 1 tablet (0.5 mg total) by mouth 3 (three) times daily as needed for anxiety. 05/19/16  Yes Cammie Sickle, MD  dexamethasone (DECADRON) 4 MG tablet Take 4 mg by mouth daily.  05/19/16  Yes Historical Provider, MD  fentaNYL (DURAGESIC - DOSED MCG/HR) 50 MCG/HR Place 1 patch (50 mcg total) onto the skin every 3 (three) days. 06/07/16  Yes Noreene Filbert, MD  HYDROcodone-acetaminophen (NORCO/VICODIN) 5-325 MG tablet Take  1 tablet by mouth every 8 (eight) hours as needed for moderate pain. 05/19/16  Yes Cammie Sickle, MD  metoCLOPramide (REGLAN) 10 MG tablet Take 1 tablet (10 mg total) by mouth every 8 (eight) hours as needed for nausea or vomiting. 02/03/15  Yes [name], MD  nystatin (MYCOSTATIN) 100000 UNIT/ML suspension Take 5 mLs (500,000 Units total) by mouth 4 (four) times daily. 06/10/16  Yes Cammie Sickle, MD  pantoprazole (PROTONIX) 20 MG tablet Take 20 mg by mouth 2 (two) times daily.    Yes Historical Provider, MD  pramipexole (MIRAPEX) 0.5 MG tablet TAKE 1 TABLET BY MOUTH AT BEDTIME 05/23/16  Yes Historical Provider, MD  pregabalin (LYRICA) 75 MG capsule Take 1 capsule (75 mg total) by mouth 2 (two) times daily. 02/03/16  Yes Cammie Sickle, MD  promethazine (PHENERGAN) 25 MG tablet Take 25 mg by mouth every 8 (eight) hours as needed for nausea or vomiting.   Yes Historical Provider, MD  lidocaine-prilocaine (EMLA) cream Apply 1 application topically as needed. Patient not taking: Reported on 06/12/2016 12/09/15   Cammie Sickle, MD  ondansetron (ZOFRAN) 8 MG tablet Take 1 tablet (8 mg total) by mouth every 8 (eight) hours as needed for nausea or vomiting (start 3 days; after chemo). Patient not taking: Reported on 06/12/2016 06/10/16   Cammie Sickle, MD   Allergies  Allergen Reactions  . Sulfa Antibiotics Other (See Comments)    Unknown-last had as a child    Review of Systems + for confusion  Physical Exam [place] WF  NAD Clear S1 S2 Abdomen soft No edema Awake, some what alert. Is able to state she is in the hospital, does not know why she is here.  Vital Signs: BP 128/78 (BP Location: Right Arm)   Pulse (!) 111   Temp 97.7 F (36.5 C) (Oral)   Resp 18   Ht '5\' 2"'$  (1.575 m)   Wt 61.2 kg (134 lb 14.7 oz)   LMP 06/07/2015   SpO2 98%   BMI 24.68 kg/m  Pain Assessment: PAINAD   Pain Score: Asleep   SpO2: SpO2: 98 % O2 Device:SpO2: 98 % O2 Flow Rate: .   IO: Intake/output summary:  Intake/Output Summary (Last 24 hours) at 06/14/16 1354 Last data filed at 06/14/16 4196  Gross per 24 hour  Intake             2010 ml  Output                0 ml  Net             2010 ml    LBM:   Baseline Weight: Weight: 61.2 kg (134 lb 14.7 oz) Most recent weight: Weight: 61.2 kg (134 lb 14.7 oz)     Palliative Assessment/Data:   Flowsheet Rows   Flowsheet Row Most Recent Value  Intake Tab  Referral Department  Hospitalist  Unit at Time of Referral  Oncology Unit  Palliative Care Primary Diagnosis  Cancer  Date Notified  06/13/16  Palliative Care Type  New Palliative care  Reason for referral  Clarify Goals of Care  Date of Admission  06/11/16  Date first seen by Palliative Care  06/14/16  # of days IP prior to Palliative referral  2  Clinical Assessment  Palliative Performance Scale Score  30%  Pain Max last 24 hours  4  Pain Min Last 24 hours  3  Dyspnea Max Last 24 Hours  4  Dyspnea Min Last 24 hours  3  Nausea Max Last 24 Hours  4  Nausea Min Last 24 Hours  3  Anxiety Max Last 24 Hours  3  Anxiety Min Last 24 Hours  2  Psychosocial & Spiritual Assessment  Palliative Care Outcomes  Patient/Family meeting held?  Yes  Who was at the meeting?  patient, mother   Palliative Care Outcomes  Clarified goals of care, Changed CPR status, Transitioned to hospice      Time In:  12.45 pm  Time Out:  1.45 pm Time Total:  60  min  Greater than 50%  of this time was spent counseling and coordinating care related to the above assessment and plan.  Signed by: Loistine Chance, MD  [phone]  Please contact Palliative Medicine Team phone at [phone] for questions and concerns.  For individual provider: See Shea Evans

## 2016-06-14 NOTE — Progress Notes (Signed)
Pt selected [facility], referral faxed/confirmed to [phone].  Referral also called to Gregary Signs, RN with [facility] [phone].

## 2016-06-15 DIAGNOSIS — K219 Gastro-esophageal reflux disease without esophagitis: Secondary | ICD-10-CM | POA: Diagnosis present

## 2016-06-15 LAB — BASIC METABOLIC PANEL
Anion gap: 9 (ref 5–15)
BUN: 9 mg/dL (ref 6–20)
CO2: 22 mmol/L (ref 22–32)
CREATININE: 0.53 mg/dL (ref 0.44–1.00)
Calcium: 8.6 mg/dL — ABNORMAL LOW (ref 8.9–10.3)
Chloride: 100 mmol/L — ABNORMAL LOW (ref 101–111)
Glucose, Bld: 128 mg/dL — ABNORMAL HIGH (ref 65–99)
POTASSIUM: 4.6 mmol/L (ref 3.5–5.1)
SODIUM: 131 mmol/L — AB (ref 135–145)

## 2016-06-15 LAB — CBC
HCT: 31.5 % — ABNORMAL LOW (ref 36.0–46.0)
Hemoglobin: 10.5 g/dL — ABNORMAL LOW (ref 12.0–15.0)
MCH: 31.3 pg (ref 26.0–34.0)
MCHC: 33.3 g/dL (ref 30.0–36.0)
MCV: 93.8 fL (ref 78.0–100.0)
PLATELETS: 489 10*3/uL — AB (ref 150–400)
RBC: 3.36 MIL/uL — AB (ref 3.87–5.11)
RDW: 14.9 % (ref 11.5–15.5)
WBC: 7.1 10*3/uL (ref 4.0–10.5)

## 2016-06-15 LAB — MAGNESIUM: Magnesium: 1.8 mg/dL (ref 1.7–2.4)

## 2016-06-15 MED ORDER — GI COCKTAIL ~~LOC~~
30.0000 mL | Freq: Three times a day (TID) | ORAL | Status: DC | PRN
  Administered 2016-06-15: 30 mL via ORAL
  Filled 2016-06-15: qty 30

## 2016-06-15 MED ORDER — SODIUM CHLORIDE 0.45 % IV SOLN
INTRAVENOUS | Status: DC
  Administered 2016-06-15 – 2016-06-16 (×3): via INTRAVENOUS

## 2016-06-15 MED ORDER — METOCLOPRAMIDE HCL 10 MG PO TABS
10.0000 mg | ORAL_TABLET | Freq: Three times a day (TID) | ORAL | Status: DC | PRN
  Administered 2016-06-15: 10 mg via ORAL
  Filled 2016-06-15: qty 1

## 2016-06-15 MED ORDER — PANTOPRAZOLE SODIUM 40 MG PO TBEC
80.0000 mg | DELAYED_RELEASE_TABLET | Freq: Every day | ORAL | Status: DC
  Administered 2016-06-15 – 2016-06-16 (×2): 80 mg via ORAL
  Filled 2016-06-15 (×2): qty 2

## 2016-06-15 MED ORDER — PROMETHAZINE HCL 25 MG PO TABS
25.0000 mg | ORAL_TABLET | Freq: Three times a day (TID) | ORAL | Status: DC | PRN
  Administered 2016-06-15: 25 mg via ORAL
  Filled 2016-06-15: qty 1

## 2016-06-15 MED ORDER — PREGABALIN 75 MG PO CAPS
75.0000 mg | ORAL_CAPSULE | Freq: Two times a day (BID) | ORAL | Status: DC
  Administered 2016-06-15 – 2016-06-16 (×3): 75 mg via ORAL
  Filled 2016-06-15 (×3): qty 1

## 2016-06-15 NOTE — Progress Notes (Signed)
Nutrition Brief Note  Chart reviewed. Pt with hx of metastatic lung cancer with brain mets. Pt seen by Palliative Care 06/14/16. Pt now transitioning to comfort care. Pt to d/c to home with hospice and Palliative states prognosis 2-3 weeks. No further nutrition interventions warranted at this time.  Please re-consult as needed.     Jarome Matin, MS, RD, LDN, [facility] Inpatient Clinical Dietitian Pager # [phone] After hours/weekend pager # [phone]

## 2016-06-15 NOTE — Progress Notes (Addendum)
PROGRESS NOTE  Tiffany Erickson  HWE:[id number] DOB: 13-Feb-1971 DOA: 06/11/2016 PCP: Elgie Collard, MD  Outpatient Specialists: Dr. Rogue Bussing, oncology  Brief Narrative: Tiffany Erickson is a 46 y.o. female with a past medical history significant for metastatic lung cancer with brain mets, on palliative Opdivo who presents with fall and is found to be delirious.  The patient was initially brought in by EMS after she fell down some stairs at home, couldn't get up, and complained of left hip pain. She can't describe the fall, or the circumstances leading up to it, but doesn't think she lost consciousness, thinks she just fell from being weak. She was consistently confused during her time in the ED and continues to be less oriented from baseline per her mother. Radiographs of the hip were negative for fracture. She also became much more tachycardic in the ER, so a CT PE protocol was obtained that showed no PE but increased RUL opacification, possibly pneumonia. CT head was obtained that gave limited comparison to previous MRI brain but suggested interval increase in size of mets with worsening edema. TRH were asked to evaluate for delirium. Case was discussed with her oncologist, Dr. Rogue Bussing 1/8 who suggested steroids and palliative care consult. Improving mental status on 1/10.   Assessment & Plan: Principal Problem:   Delirium Active Problems:   Malignant neoplasm of right upper lobe of lung ([facility])   Hypokalemia   Brain metastasis ([facility])   Normocytic anemia  Delirium vs. subacute encephalopathy: Time course per mother is slowly worsening ability to concentrate which is relatively fixed/consistent. Suspect primarily due to enlarging brain mets w/associated edema. Other contributions from infection, dehydration or hypokalemia, potentially meds (she is on zolpidem, alprazolam, fentanyl, Lyrica, Ambien, as well as several nausea medicines).  - Started dexamethasone '4mg'$  IV q8h 1/8. - Continue  supportive care with IVF, correcting electrolytes, limiting sedating medications - Hold Reglan, phenergan. Restarting lyrica. - Sparing use of pain medication, Ambien/alprazolam - PT/OT consulted: recommending SNF at patient's current functional status. Will likely go home with hospice. Reeval today.  Possible community acquired pneumonia: Tachycardic but has remained afebrile. Lactate 0.8, PCT <0.10, influenza negative. - Ceftriaxone and azithromycin. DC abx since PCT still < 0.10 and cultures negative - Monitor blood cultures  Sinus tachycardia: Confirmed by ECG. Possibly related to cerebral inflammation, as evidence for pneumonia is limited.  - Treat possible underlying condition with steroids - Appears euvolemic, not hypotensive. Consider fluid challenge if not improving. - Begin trial metoprolol tartrate '25mg'$  po BID  Hypokalemia with hypomagnesemia: Suspect ?poor po. Resolved s/p repletion - Recheck Mg, K   Lung cancer with brain metastases: Discussed with oncologist, Dr. Rogue Bussing.  - Appreciate palliative care input. Pt is DNR, will likely discharge to hospice at home depending on clinical course. - Continue fentanyl patch - Norco prn - Continue alprazolam PRN  Normocytic anemia: Mild.  Near [place]. Related to cancer. - Monitor  GERD: Chronic, exacerbated with steroids.  - Protonix po - GI cocktail prn - Does not sound cardiac, but will continue telemetry x24 hours.  Back pain: Exam consistent with musculoskeletal pain.  - [place] as above - Reluctant to start muscle relaxer with confusion.   DVT prophylaxis: Lovenox Code Status: Full, confirmed with mother. Family Communication: Maternal Aunt at bedside Disposition Plan: Continue IV steroids x 3 days and reevaluate 1/11. Anticipate discharge home with hospice 1/11 or 1/12.  Consultants:   Palliative care team  Procedures:   None  Antimicrobials:  Ceftriaxone 1/6 >>  1/8  Azithromycin 1/6 >> 1/8  Subjective: Pt more alert and able to follow commands, much less confused, just intermittently blocking. Weakness improved. Having reflux which is not new for her. PPI was held at admission.  Objective: Vitals:   06/14/16 1408 06/14/16 2201 06/15/16 0650 06/15/16 0948  BP: 134/81 130/76 132/83 135/80  Pulse: (!) 103 (!) 107 (!) 103 98  Resp: '19 18 18   '$ Temp: 97.6 F (36.4 C) 97.6 F (36.4 C) 97.4 F (36.3 C)   TempSrc: Oral Oral Oral   SpO2: 98% 100% 99%   Weight:      Height:        Intake/Output Summary (Last 24 hours) at 06/15/16 1140 Last data filed at 06/15/16 0600  Gross per 24 hour  Intake             1100 ml  Output              400 ml  Net              700 ml   Filed Weights   06/12/16 0638  Weight: 61.2 kg (134 lb 14.7 oz)    Examination: General exam: 46 y.o. female in no distress Respiratory system: Non-labored breathing room air. Clear to auscultation bilaterally.  Cardiovascular system: Tachycardic rate and regular rhythm. No murmur, rub, or gallop. No JVD, and no pedal edema. Gastrointestinal system: Abdomen soft, non-tender, non-distended, with normoactive bowel sounds. No organomegaly or masses felt. Central nervous system: Alert, oriented to person and place, says she was admitted for falling. No focal neurological deficits. MSK: Paraspinal spasm with tenderness without midline spinal tenderness. Warm extremities, no deformities Skin: No rashes, lesions no ulcers Psychiatry: Judgement and insight appear normal. Mood & affect appropriate.   Data Reviewed: I have personally reviewed following labs and imaging studies  CBC:  Recent Labs Lab 06/10/16 0912 06/12/16 0201 06/13/16 0522 06/14/16 0615 06/15/16 0444  WBC 7.8 4.0 4.6 3.2* 7.1  NEUTROABS 6.0 2.6  --   --   --   HGB 11.8* 10.5* 9.4* 10.4* 10.5*  HCT 35.0 31.8* 28.8* 31.5* 31.5*  MCV 94.3 96.4 98.0 93.5 93.8  PLT 385 398 391 427* 578*   Basic Metabolic  Panel:  Recent Labs Lab 06/10/16 0912 06/12/16 0201 06/12/16 0831 06/13/16 0522 06/14/16 0615 06/15/16 0444  NA 134* 138  --  141 135 131*  K 3.3* 3.3*  --  3.7 4.0 4.6  CL 99* 104  --  105 100* 100*  CO2 26 25  --  26 21* 22  GLUCOSE 120* 93  --  86 126* 128*  BUN 5* 5*  --  <5* 6 9  CREATININE 0.63 0.47  --  0.48 0.40* 0.53  CALCIUM 9.3 8.9  --  8.8* 8.4* 8.6*  MG  --   --  1.3*  --  1.9 1.8   GFR: Estimated Creatinine Clearance: 77.2 mL/min (by C-G formula based on SCr of 0.53 mg/dL). Liver Function Tests: No results for input(s): AST, ALT, ALKPHOS, BILITOT, PROT, ALBUMIN in the last 168 hours. No results for input(s): LIPASE, AMYLASE in the last 168 hours. No results for input(s): AMMONIA in the last 168 hours. Coagulation Profile: No results for input(s): INR, PROTIME in the last 168 hours. Cardiac Enzymes: No results for input(s): CKTOTAL, CKMB, CKMBINDEX, TROPONINI in the last 168 hours. BNP (last 3 results) No results for input(s): PROBNP in the last 8760 hours. HbA1C: No results  for input(s): HGBA1C in the last 72 hours. CBG: No results for input(s): GLUCAP in the last 168 hours. Lipid Profile: No results for input(s): CHOL, HDL, LDLCALC, TRIG, CHOLHDL, LDLDIRECT in the last 72 hours. Thyroid Function Tests: No results for input(s): TSH, T4TOTAL, FREET4, T3FREE, THYROIDAB in the last 72 hours. Anemia Panel: No results for input(s): VITAMINB12, FOLATE, FERRITIN, TIBC, IRON, RETICCTPCT in the last 72 hours. Urine analysis:    Component Value Date/Time   COLORURINE YELLOW 02/03/2015 0329   APPEARANCEUR CLOUDY (A) 02/03/2015 0329   APPEARANCEUR Clear 09/25/2014 2254   LABSPEC 1.003 (L) 02/03/2015 0329   LABSPEC 1.001 09/25/2014 2254   PHURINE 6.5 02/03/2015 0329   GLUCOSEU NEGATIVE 02/03/2015 0329   GLUCOSEU Negative 09/25/2014 2254   HGBUR NEGATIVE 02/03/2015 0329   BILIRUBINUR NEGATIVE 02/03/2015 0329   BILIRUBINUR Negative 09/25/2014 2254   KETONESUR  NEGATIVE 02/03/2015 0329   PROTEINUR NEGATIVE 02/03/2015 0329   UROBILINOGEN 0.2 02/03/2015 0329   NITRITE NEGATIVE 02/03/2015 0329   LEUKOCYTESUR NEGATIVE 02/03/2015 0329   LEUKOCYTESUR Negative 09/25/2014 2254   Sepsis Labs: '@LABRCNTIP'$ (procalcitonin:4,lacticidven:4)  ) Recent Results (from the past 240 hour(s))  Culture, blood (single)     Status: None (Preliminary result)   Collection Time: 06/12/16  8:31 AM  Result Value Ref Range Status   Specimen Description BLOOD RIGHT ARM  Final   Special Requests IN PEDIATRIC BOTTLE [place]  Final   Culture   Final    NO GROWTH 2 DAYS Performed at [facility]    Report Status PENDING  Incomplete     Radiology Studies: No results found.  Scheduled Meds: . dexamethasone  4 mg Intravenous Q8H  . fentaNYL  50 mcg Transdermal Q72H  . Influenza vac split quadrivalent PF  0.5 mL Intramuscular Tomorrow-1000  . metoprolol tartrate  25 mg Oral BID  . pantoprazole  80 mg Oral Daily  . pneumococcal 23 valent vaccine  0.5 mL Intramuscular Tomorrow-1000  . sodium chloride flush  10-40 mL Intracatheter Q12H  . sodium chloride flush  3 mL Intravenous Q12H   Continuous Infusions: . 0.9 % NaCl with KCl 20 mEq / L 100 mL/hr at 06/15/16 0757     LOS: 3 days   Time spent: 25 minutes  Vance Gather, MD [facility] Pager [phone]  If 7PM-7AM, please contact night-coverage [email or web address] Password [email or web address] 06/15/2016, 11:40 AM

## 2016-06-15 NOTE — Progress Notes (Signed)
Physical Therapy Discharge Patient Details Name: Tiffany Erickson MRN: [id number] DOB: [birth year] Today's Date: 06/15/2016 Time:  -     Patient discharged from PT services secondary to medical decline - to DC to home with Hospice, comfort care. will need to re-order PT to resume therapy services.  PGP     Marcelino Freestone PT [phone]  06/15/2016, 12:05 PM

## 2016-06-16 LAB — CBC
HEMATOCRIT: 32.5 % — AB (ref 36.0–46.0)
HEMOGLOBIN: 10.9 g/dL — AB (ref 12.0–15.0)
MCH: 31.3 pg (ref 26.0–34.0)
MCHC: 33.5 g/dL (ref 30.0–36.0)
MCV: 93.4 fL (ref 78.0–100.0)
Platelets: 443 10*3/uL — ABNORMAL HIGH (ref 150–400)
RBC: 3.48 MIL/uL — ABNORMAL LOW (ref 3.87–5.11)
RDW: 14.6 % (ref 11.5–15.5)
WBC: 8.3 10*3/uL (ref 4.0–10.5)

## 2016-06-16 LAB — BASIC METABOLIC PANEL
ANION GAP: 8 (ref 5–15)
BUN: 7 mg/dL (ref 6–20)
CHLORIDE: 100 mmol/L — AB (ref 101–111)
CO2: 26 mmol/L (ref 22–32)
Calcium: 8.9 mg/dL (ref 8.9–10.3)
Creatinine, Ser: 0.43 mg/dL — ABNORMAL LOW (ref 0.44–1.00)
GFR calc Af Amer: >60 mL/min (ref >60)
GLUCOSE: 122 mg/dL — AB (ref 65–99)
POTASSIUM: 3.4 mmol/L — AB (ref 3.5–5.1)
SODIUM: 134 mmol/L — AB (ref 135–145)

## 2016-06-16 LAB — MAGNESIUM: Magnesium: 1.6 mg/dL — ABNORMAL LOW (ref 1.7–2.4)

## 2016-06-16 MED ORDER — SENNOSIDES-DOCUSATE SODIUM 8.6-50 MG PO TABS: 1.0000 | ORAL_TABLET | Freq: Every evening | ORAL | 0 refills | Status: DC | PRN

## 2016-06-16 MED ORDER — HEPARIN SOD (PORK) LOCK FLUSH 100 UNIT/ML IV SOLN
500.0000 [IU] | INTRAVENOUS | Status: AC | PRN
  Administered 2016-06-16: 500 [IU]

## 2016-06-16 MED ORDER — MAGNESIUM SULFATE IN D5W 1-5 GM/100ML-% IV SOLN
1.0000 g | Freq: Once | INTRAVENOUS | Status: AC
  Administered 2016-06-16: 1 g via INTRAVENOUS
  Filled 2016-06-16: qty 100

## 2016-06-16 MED ORDER — DEXAMETHASONE 4 MG PO TABS: 4.0000 mg | ORAL_TABLET | Freq: Two times a day (BID) | ORAL | 0 refills | Status: DC

## 2016-06-16 MED ORDER — ALPRAZOLAM 0.5 MG PO TABS: 0.5000 mg | ORAL_TABLET | Freq: Three times a day (TID) | ORAL | 0 refills | Status: AC | PRN

## 2016-06-16 MED ORDER — POTASSIUM CHLORIDE CRYS ER 20 MEQ PO TBCR
30.0000 meq | EXTENDED_RELEASE_TABLET | Freq: Once | ORAL | Status: AC
  Administered 2016-06-16: 30 meq via ORAL
  Filled 2016-06-16: qty 1

## 2016-06-16 NOTE — Progress Notes (Signed)
Pt becoming increasingly agitated and attempting to get out of bed. Pt stating she wants to get out of bed so that she can walk outside. Pt reoriented to being in the hospital. HR increasing to 150. Pt resistant to lying back in the bed, stating she does not need help and is able to walk without assistance. Explained to the pt that due to concerns for her safety and falls, staff needs to assist her before trying to get out of bed. Encouraged pt to rest in the bed, pt cooperative at this time.

## 2016-06-16 NOTE — Discharge Summary (Signed)
Physician Discharge Summary  Tiffany Erickson:[id number] DOB: [birth year] DOA: 06/11/2016  PCP: Elgie Collard, MD  Admit date: 06/11/2016 Discharge date: 06/16/2016  Admitted From: Home Disposition: Home with hospice   Recommendations for Outpatient Follow-up:  1. Follow up with PCP in 1-2 weeks 2. Please obtain BMP/CBC in one week 3. Please follow up on the following pending results:  Home Health: Hospice Equipment/Devices: None Discharge Condition: Stable for discharge, guarded prognosis CODE STATUS: DNR Diet recommendation: As tolerated  Brief/Interim Summary: Tiffany Erickson a 46 y.o.femalewith a past medical history significant for metastatic lung cancer with brain mets, on palliative Opdivowho presents with fall and is found to be delirious.  The patient was initially brought in by EMS after she fell down some stairs at home, couldn't get up, and complained of left hip pain. She can't describe the fall, orthe circumstances leading up to it, but doesn't think she lost consciousness,thinksshe just fell from being weak. She was consistently confused during her time in the ED and continues to be less oriented from baseline per her mother. Radiographs of the hip were negative for fracture. She also became much more tachycardic in the ER, so a CT PE protocol was obtained that showed no PE but increased RUL opacification, possibly pneumonia. CT head was obtained that gave limited comparison to previous MRI brain but suggested interval increase in size of metswith worsening edema. TRH were asked to evaluate for delirium. Case was discussed with her oncologist, Dr. Rogue Bussing 1/8 who suggested steroids and palliative care consult. Palliative care was consulted for goals of care with prognosis estimation around 2-3 weeks depending on trajectory/response to steroids. DNR/DNI was established, and care management has offer hospice at home which will be provided on discharge. She's had improving  mental status on 1/10, stable on 1/11, has ambulated with assistance and tolerated per oral intake. She will be discharged with home hospice services and to continue oral steroids until follow up with her oncologist, Dr. Rogue Bussing.   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 Suspect primarily due to enlarging brain mets w/associated edema. Other contributions from infection, dehydration or hypokalemia, potentially meds (she is on zolpidem, alprazolam, fentanyl, Lyrica, Ambien, as well as several nausea medicines).  - Started dexamethasone '4mg'$  IV q8h 1/8 > will slowly taper to decadron '4mg'$  BID until follow up with oncology. Restarted PPI therapy with this. - Restarted reglan, phenergan. Restarting lyrica. - Sparing use of pain medication, Ambien/alprazolam - Will go home with hospice, and due to high fall risk, her family will be present as nearly to 24-hrs/day as possible.   Lung cancer with brain metastases:Discussed with oncologist, Dr. Rogue Bussing.  - Appreciate palliative care input. Pt is DNR, will discharge to hospice at home depending on clinical course. - Follow up with Dr. Rogue Bussing - Continue fentanyl patch - Norco prn - Continue alprazolam PRN  Possible community acquired pneumonia:Tachycardic but has remained afebrile. Lactate 0.8, PCT <0.10, influenza negative. - Started ceftriaxone and azithromycin at admission. DC abx since PCT still < 0.10 and cultures negative - Monitor blood cultures  Sinus tachycardia: Confirmed by ECG. Possibly related to cerebral inflammation, as evidence for pneumonia is limited. Appears euvolemic. - Treat possible underlying condition  with steroids.  - We have trialed and metoprolol tartrate '25mg'$  po BID with good effect, though this is not likely to appreciably improve quality or quantity of life. Consider recontinuing if symptoms occur.  Hypokalemia with hypomagnesemia: Suspect ?poor po. Resolved s/p repletion - Recheck Mg, K at follow up   Normocytic anemia:Mild. Near basleine. Related to cancer. - Monitor  GERD: Chronic, exacerbated with steroids.  - Protonix po - GI cocktail prn  Back pain: Exam consistent with musculoskeletal pain.  - [place] as above - Reluctant to start muscle relaxer with confusion.   Discharge Instructions Discharge Instructions    Discharge instructions    Complete by:  As directed    You were admitted for confusion that led to a fall at home. The confusion is due to swelling in the brain caused by cancer metastases. The swelling is improving with steroids, and you are stable for discharge with the following recommendations:  - Continue taking decadron '4mg'$  twice daily (steroids) - Continue taking all other medications as you previously were - Schedule a follow up appointment with Dr. Rogue Bussing as soon as possible - You will receive home hospice services, and for at least the first few days, I highly recommend having someone around to assist with walking and tasks of daily living 24 hours a day to minimize the possibility of falling.     Allergies as of 06/16/2016      Reactions   Sulfa Antibiotics Other (See Comments)   Unknown-last had as a child      Medication List    TAKE these medications   albuterol 108 (90 Base) MCG/ACT inhaler Commonly known as:  PROVENTIL HFA;VENTOLIN HFA Inhale 2 puffs into the lungs every 6 (six) hours as needed for wheezing or shortness of breath.   ALPRAZolam 0.5 MG tablet Commonly known as:  XANAX Take 1 tablet (0.5 mg total) by mouth 3 (three) times daily as needed for anxiety.   dexamethasone 4 MG tablet Commonly  known as:  DECADRON Take 1 tablet (4 mg total) by mouth 2 (two) times daily. What changed:  when to take this   fentaNYL 50 MCG/HR Commonly known as:  DURAGESIC - dosed mcg/hr Place 1 patch (50 mcg total) onto the skin every 3 (three) days.   HYDROcodone-acetaminophen 5-325 MG tablet Commonly known as:  NORCO/VICODIN Take 1 tablet by mouth every 8 (eight) hours as needed for moderate pain.   lidocaine-prilocaine cream Commonly known as:  EMLA Apply 1 application topically as needed.   metoCLOPramide 10 MG tablet Commonly known as:  REGLAN Take 1 tablet (10 mg total) by mouth every 8 (eight) hours as needed for nausea or vomiting.   nystatin 100000 UNIT/ML suspension Commonly known as:  MYCOSTATIN Take 5 mLs (500,000 Units total) by mouth 4 (four) times daily.   ondansetron 8 MG tablet Commonly known as:  ZOFRAN Take 1 tablet (8 mg total) by mouth every 8 (eight) hours as needed for nausea or vomiting (start 3 days; after chemo).   pantoprazole 20 MG tablet Commonly known as:  PROTONIX Take 20 mg by mouth 2 (two) times daily.   pramipexole 0.5 MG tablet Commonly known as:  MIRAPEX TAKE 1 TABLET BY MOUTH AT BEDTIME   pregabalin 75 MG capsule Commonly known as:  LYRICA Take 1 capsule (75 mg total) by mouth 2 (two) times daily.   promethazine 25 MG tablet Commonly known as:  PHENERGAN Take 25 mg by mouth every 8 (eight)  hours as needed for nausea or vomiting.   senna-docusate 8.6-50 MG tablet Commonly known as:  Senokot-S Take 1 tablet by mouth at bedtime as needed for mild constipation.      Follow-up Information    Cammie Sickle, MD Follow up.   Specialties:  Internal Medicine, Oncology Contact information: [address] [place] [address] [phone]          Allergies  Allergen Reactions  . Sulfa Antibiotics Other (See Comments)    Unknown-last had as a child    Consultations:  Palliative care team, Dr.  Rowe Pavy  Procedures/Studies: Dg Lumbar Spine Complete  Result Date: 06/11/2016 CLINICAL DATA:  46 year old with current history of metastatic lung cancer who fell down stairs at home earlier this evening and complains of left hip pain. Initial encounter. EXAM: LUMBAR SPINE - COMPLETE 4+ VIEW COMPARISON:  Bone window images from CT abdomen and pelvis 06/01/2015. FINDINGS: Five non-rib-bearing lumbar vertebrae with anatomic alignment. Slight thoracolumbar dextroscoliosis. No fractures. Mild disc space narrowing and endplate hypertrophic changes at L3-4. Remaining disc spaces well-preserved. No pars defects. No significant facet arthropathy. Mild abdominal aortic atherosclerosis without evidence of aneurysm. IMPRESSION: 1. No acute osseous abnormality. 2. Mild degenerative disc disease and spondylosis at L3-4. 3. Mild thoracolumbar dextroscoliosis. 4. Mild abdominal aortic atherosclerosis without evidence of aneurysm. Electronically Signed   By: Evangeline Dakin M.D.   On: 06/11/2016 23:51   Ct Head Wo Contrast  Result Date: 06/12/2016 CLINICAL DATA:  46 y/o F; history of metastatic lung cancer with confusion. EXAM: CT HEAD WITHOUT CONTRAST TECHNIQUE: Contiguous axial images were obtained from the base of the skull through the vertex without intravenous contrast. COMPARISON:  05/17/2016 MRI of the head FINDINGS: Brain: Metastasis within the right occipital lobe, right parafalcine frontal lobe, left parafalcine parietal lobe are increased in size in comparison with the prior MRI given differences in technique for example the right parafalcine frontal lesion measures 18 mm, previously 10 mm measured in a similar fashion (series 2, image 22). Additionally the areas of edema surrounding the metastasis has increased. There is no evidence for large acute infarct, herniation, or brain parenchymal hemorrhage. Vascular: No hyperdense vessel or unexpected calcification. Skull: Normal. Negative for fracture or focal lesion.  Sinuses/Orbits: Mucous retention cyst within the sphenoid sinus. Opacification of the bilateral mastoid air cells similar prior MRI. Orbits are unremarkable. Other: None. IMPRESSION: In comparison with the prior MRI of the brain metastasis in the right frontal lobe, left parietal lobe, and right occipital lobe are increased in size as well as associated edema. Suboptimal evaluation for new metastasis in the absence of intravenous contrast. No evidence for large acute infarct or intracranial hemorrhage. Electronically Signed   By: Kristine Garbe M.D.   On: 06/12/2016 04:30   Ct Angio Chest Pe W And/or Wo Contrast  Result Date: 06/12/2016 CLINICAL DATA:  Confusion and tachycardia EXAM: CT ANGIOGRAPHY CHEST WITH CONTRAST TECHNIQUE: Multidetector CT imaging of the chest was performed using the standard protocol during bolus administration of intravenous contrast. Multiplanar CT image reconstructions and MIPs were obtained to evaluate the vascular anatomy. CONTRAST:  100 mL Isovue 370 intravenous COMPARISON:  CT chest 05/17/2016 FINDINGS: Cardiovascular: Satisfactory opacification of the pulmonary arteries to the segmental level. No evidence of pulmonary embolism. Normal heart size. Small pericardial effusion. Non aneurysmal aorta. No dissection. Mediastinum/Nodes: Stable 9 mm right paratracheal lymph node. Small lymph nodes adjacent to the aortic arch are unchanged. No axillary adenopathy. Trachea and normal. Thyroid normal. Esophagus  unremarkable. Lungs/Pleura: Development of small right-sided pleural effusion. Increased consolidation within the apical portion of right upper lobe at the site of prior tumor. Air bronchograms and dilated bronchi are again visualized. Right paramediastinal presumed post radiation fibrotic changes are again identified. Interim finding of mild ground-glass nodular densities in the right lower lobe. Additional scattered foci of ground-glass density in the left upper lobe as  well. Upper Abdomen: No acute abnormality in the upper abdomen. Musculoskeletal: No suspicious bone lesion. Review of the MIP images confirms the above findings. IMPRESSION: 1. No CT evidence for acute pulmonary embolus. 2. New small right-sided pleural effusion. Interval increase in consolidation within the apical portion of right upper lobe in the region of previously noted tumor ; it is uncertain if this represents increase in size of pre-existing mass or superimposed infection/pneumonia. 3. 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 Increased ground-glass density in the right lower lobe with scattered foci of ground-glass in the left upper lobe suggests possible infectious or inflammatory process. 4. Stable right peritracheal 9 mm lymph node. Electronically Signed   By: Donavan Foil M.D.   On: 06/12/2016 03:35   Dg Hip Unilat W Or Wo Pelvis 2-3 Views Left  Result Date: 06/11/2016 CLINICAL DATA:  46 year old with current history of metastatic right upper lobe lung cancer who fell down stairs at home earlier this evening and complains of left hip pain. Initial encounter. EXAM: DG HIP (WITH OR WITHOUT PELVIS) 2-3V LEFT COMPARISON:  Bone window images from CT pelvis 06/01/2015. FINDINGS: No evidence of acute fracture or dislocation. Well-preserved joint space. Well-preserved bone mineral density. Included AP pelvis demonstrates a symmetric normal-appearing contralateral right hip. Sacroiliac joints and symphysis pubis intact. No evidence of osseous metastatic disease. IMPRESSION: Normal examination. Electronically Signed   By: Evangeline Dakin M.D.   On: 06/11/2016 23:49    Subjective: Pt feels better, ambulating with assistance today. Had some agitated delirium that subsided last night with reorientation to surroundings.   Discharge Exam: Vitals:   06/16/16 0936 06/16/16 0937  BP: (!) 137/105 (!) 146/72  Pulse: 95   Resp:     Temp:     Vitals:   06/15/16 2109 06/16/16 0511 06/16/16 0936 06/16/16 0937  BP: (!) 131/93 130/83 (!) 137/105 (!) 146/72  Pulse: 99 97 95   Resp: 18 18    Temp: 97.7 F (36.5 C) 98 F (36.7 C)    TempSrc: Oral Oral    SpO2: 99% 98% 97%   Weight:      Height:       General: Pt is alert, awake, not in acute distress Cardiovascular: RRR, S1/S2 +, no rubs, no gallops Respiratory: CTA bilaterally, no wheezing, no rhonchi Abdominal: Soft, NT, ND, bowel sounds + Extremities: no edema, no cyanosis CNS: Alert, oriented to person and place, says she was admitted for falling. No focal neurological deficits. MSK: Paraspinal spasm with tenderness without midline spinal tenderness. Warm extremities, no deformities  The results of significant diagnostics from this hospitalization (including imaging, microbiology, ancillary and laboratory) are listed below for reference.    Microbiology: Recent Results (from the past 240 hour(s))  Culture, blood (single)     Status: None (Preliminary result)   Collection Time: 06/12/16  8:31 AM  Result Value Ref Range Status   Specimen Description BLOOD RIGHT ARM  Final   Special Requests IN PEDIATRIC BOTTLE 2CC  Final   Culture   Final    NO GROWTH 3  DAYS Performed at [facility]    Report Status PENDING  Incomplete     Labs: BNP (last 3 results) No results for input(s): BNP in the last 8760 hours. Basic Metabolic Panel:  Recent Labs Lab 06/12/16 0201 06/12/16 0831 06/13/16 0522 06/14/16 0615 06/15/16 0444 06/16/16 0303  NA 138  --  141 135 131* 134*  K 3.3*  --  3.7 4.0 4.6 3.4*  CL 104  --  105 100* 100* 100*  CO2 25  --  26 21* 22 26  GLUCOSE 93  --  86 126* 128* 122*  BUN 5*  --  <5* '6 9 7  '$ CREATININE 0.47  --  0.48 0.40* 0.53 0.43*  CALCIUM 8.9  --  8.8* 8.4* 8.6* 8.9  MG  --  1.3*  --  1.9 1.8 1.6*   Liver Function Tests: No results for input(s): AST, ALT, ALKPHOS, BILITOT, PROT, ALBUMIN in the last 168 hours. No  results for input(s): LIPASE, AMYLASE in the last 168 hours. No results for input(s): AMMONIA in the last 168 hours. CBC:  Recent Labs Lab 06/10/16 0912 06/12/16 0201 06/13/16 0522 06/14/16 0615 06/15/16 0444 06/16/16 0303  WBC 7.8 4.0 4.6 3.2* 7.1 8.3  NEUTROABS 6.0 2.6  --   --   --   --   HGB 11.8* 10.5* 9.4* 10.4* 10.5* 10.9*  HCT 35.0 31.8* 28.8* 31.5* 31.5* 32.5*  MCV 94.3 96.4 98.0 93.5 93.8 93.4  PLT 385 398 391 427* 489* 443*   Cardiac Enzymes: No results for input(s): CKTOTAL, CKMB, CKMBINDEX, TROPONINI in the last 168 hours. BNP: Invalid input(s): POCBNP CBG: No results for input(s): GLUCAP in the last 168 hours. D-Dimer No results for input(s): DDIMER in the last 72 hours. Hgb A1c No results for input(s): HGBA1C in the last 72 hours. Lipid Profile No results for input(s): CHOL, HDL, LDLCALC, TRIG, CHOLHDL, LDLDIRECT in the last 72 hours. Thyroid function studies No results for input(s): TSH, T4TOTAL, T3FREE, THYROIDAB in the last 72 hours.  Invalid input(s): FREET3 Anemia work up No results for input(s): VITAMINB12, FOLATE, FERRITIN, TIBC, IRON, RETICCTPCT in the last 72 hours. Urinalysis    Component Value Date/Time   COLORURINE YELLOW 02/03/2015 0329   APPEARANCEUR CLOUDY (A) 02/03/2015 0329   APPEARANCEUR Clear 09/25/2014 2254   LABSPEC 1.003 (L) 02/03/2015 0329   LABSPEC 1.001 09/25/2014 2254   PHURINE 6.5 02/03/2015 0329   GLUCOSEU NEGATIVE 02/03/2015 0329   GLUCOSEU Negative 09/25/2014 2254   HGBUR NEGATIVE 02/03/2015 0329   BILIRUBINUR NEGATIVE 02/03/2015 0329   BILIRUBINUR Negative 09/25/2014 2254   KETONESUR NEGATIVE 02/03/2015 0329   PROTEINUR NEGATIVE 02/03/2015 0329   UROBILINOGEN 0.2 02/03/2015 0329   NITRITE NEGATIVE 02/03/2015 0329   LEUKOCYTESUR NEGATIVE 02/03/2015 0329   LEUKOCYTESUR Negative 09/25/2014 2254   Sepsis Labs Invalid input(s): PROCALCITONIN,  WBC,  LACTICIDVEN Microbiology Recent Results (from the past 240 hour(s))   Culture, blood (single)     Status: None (Preliminary result)   Collection Time: 06/12/16  8:31 AM  Result Value Ref Range Status   Specimen Description BLOOD RIGHT ARM  Final   Special Requests IN PEDIATRIC BOTTLE [place]  Final   Culture   Final    NO GROWTH 3 DAYS Performed at [facility]    Report Status PENDING  Incomplete    Time coordinating discharge: Over 4 minutes  Vance Gather, MD  [facility] 06/16/2016, 12:45 PM Pager [phone]  If 7PM-7AM, please contact night-coverage [email or web address] Password  [email or web address]

## 2016-06-16 NOTE — Progress Notes (Signed)
A called was made to Gregary Signs, RN with [facility] [phone] of pt's discharge today.

## 2016-06-16 NOTE — Progress Notes (Unsigned)
MD asked that PSN contact patient about needs in the home.  Patient currently hospitalized at this time.

## 2016-06-18 LAB — CULTURE, BLOOD (SINGLE): Culture: NO GROWTH

## 2016-06-21 ENCOUNTER — Other Ambulatory Visit: Payer: Self-pay | Admitting: Internal Medicine

## 2016-06-21 DIAGNOSIS — C3411 Malignant neoplasm of upper lobe, right bronchus or lung: Secondary | ICD-10-CM

## 2016-06-24 ENCOUNTER — Inpatient Hospital Stay (HOSPITAL_BASED_OUTPATIENT_CLINIC_OR_DEPARTMENT_OTHER): Payer: Medicaid Other | Admitting: Internal Medicine

## 2016-06-24 ENCOUNTER — Inpatient Hospital Stay: Payer: Medicaid Other

## 2016-06-24 DIAGNOSIS — R630 Anorexia: Secondary | ICD-10-CM | POA: Diagnosis not present

## 2016-06-24 DIAGNOSIS — C3411 Malignant neoplasm of upper lobe, right bronchus or lung: Secondary | ICD-10-CM

## 2016-06-24 DIAGNOSIS — D72829 Elevated white blood cell count, unspecified: Secondary | ICD-10-CM

## 2016-06-24 DIAGNOSIS — Z800 Family history of malignant neoplasm of digestive organs: Secondary | ICD-10-CM

## 2016-06-24 DIAGNOSIS — K219 Gastro-esophageal reflux disease without esophagitis: Secondary | ICD-10-CM

## 2016-06-24 DIAGNOSIS — R110 Nausea: Secondary | ICD-10-CM | POA: Diagnosis not present

## 2016-06-24 DIAGNOSIS — Z79899 Other long term (current) drug therapy: Secondary | ICD-10-CM

## 2016-06-24 DIAGNOSIS — C7931 Secondary malignant neoplasm of brain: Secondary | ICD-10-CM

## 2016-06-24 DIAGNOSIS — L598 Other specified disorders of the skin and subcutaneous tissue related to radiation: Secondary | ICD-10-CM

## 2016-06-24 DIAGNOSIS — R64 Cachexia: Secondary | ICD-10-CM

## 2016-06-24 DIAGNOSIS — E860 Dehydration: Secondary | ICD-10-CM

## 2016-06-24 DIAGNOSIS — R350 Frequency of micturition: Secondary | ICD-10-CM

## 2016-06-24 DIAGNOSIS — R300 Dysuria: Secondary | ICD-10-CM

## 2016-06-24 DIAGNOSIS — C771 Secondary and unspecified malignant neoplasm of intrathoracic lymph nodes: Secondary | ICD-10-CM | POA: Diagnosis not present

## 2016-06-24 DIAGNOSIS — Z87440 Personal history of urinary (tract) infections: Secondary | ICD-10-CM

## 2016-06-24 DIAGNOSIS — R109 Unspecified abdominal pain: Secondary | ICD-10-CM | POA: Diagnosis not present

## 2016-06-24 DIAGNOSIS — E876 Hypokalemia: Secondary | ICD-10-CM | POA: Diagnosis not present

## 2016-06-24 DIAGNOSIS — Z923 Personal history of irradiation: Secondary | ICD-10-CM | POA: Diagnosis not present

## 2016-06-24 DIAGNOSIS — R251 Tremor, unspecified: Secondary | ICD-10-CM | POA: Diagnosis not present

## 2016-06-24 DIAGNOSIS — R2681 Unsteadiness on feet: Secondary | ICD-10-CM | POA: Diagnosis not present

## 2016-06-24 DIAGNOSIS — K861 Other chronic pancreatitis: Secondary | ICD-10-CM

## 2016-06-24 DIAGNOSIS — F17210 Nicotine dependence, cigarettes, uncomplicated: Secondary | ICD-10-CM

## 2016-06-24 DIAGNOSIS — Z5112 Encounter for antineoplastic immunotherapy: Secondary | ICD-10-CM | POA: Diagnosis present

## 2016-06-24 DIAGNOSIS — Z9181 History of falling: Secondary | ICD-10-CM

## 2016-06-24 DIAGNOSIS — Z9221 Personal history of antineoplastic chemotherapy: Secondary | ICD-10-CM | POA: Diagnosis not present

## 2016-06-24 DIAGNOSIS — R5382 Chronic fatigue, unspecified: Secondary | ICD-10-CM | POA: Diagnosis not present

## 2016-06-24 DIAGNOSIS — B370 Candidal stomatitis: Secondary | ICD-10-CM | POA: Diagnosis not present

## 2016-06-24 LAB — COMPREHENSIVE METABOLIC PANEL
ALT: 17 U/L (ref 14–54)
ANION GAP: 10 (ref 5–15)
AST: 22 U/L (ref 15–41)
Albumin: 3.7 g/dL (ref 3.5–5.0)
Alkaline Phosphatase: 45 U/L (ref 38–126)
BUN: 11 mg/dL (ref 6–20)
CHLORIDE: 103 mmol/L (ref 101–111)
CO2: 23 mmol/L (ref 22–32)
Calcium: 9.1 mg/dL (ref 8.9–10.3)
Creatinine, Ser: 0.59 mg/dL (ref 0.44–1.00)
Glucose, Bld: 93 mg/dL (ref 65–99)
POTASSIUM: 3.4 mmol/L — AB (ref 3.5–5.1)
Sodium: 136 mmol/L (ref 135–145)
Total Bilirubin: 0.3 mg/dL (ref 0.3–1.2)
Total Protein: 6.7 g/dL (ref 6.5–8.1)

## 2016-06-24 LAB — URINALYSIS, COMPLETE (UACMP) WITH MICROSCOPIC
Bilirubin Urine: NEGATIVE
Glucose, UA: NEGATIVE mg/dL
Hgb urine dipstick: NEGATIVE
Ketones, ur: 5 mg/dL — AB
Leukocytes, UA: NEGATIVE
Nitrite: NEGATIVE
Protein, ur: NEGATIVE mg/dL
Specific Gravity, Urine: 1.026 (ref 1.005–1.030)
pH: 6 (ref 5.0–8.0)

## 2016-06-24 LAB — CBC WITH DIFFERENTIAL/PLATELET
BASOS ABS: 0 10*3/uL (ref 0–0.1)
Basophils Relative: 0 %
EOS PCT: 0 %
Eosinophils Absolute: 0 10*3/uL (ref 0–0.7)
HCT: 34.6 % — ABNORMAL LOW (ref 35.0–47.0)
Hemoglobin: 11.7 g/dL — ABNORMAL LOW (ref 12.0–16.0)
LYMPHS PCT: 9 %
Lymphs Abs: 1.5 10*3/uL (ref 1.0–3.6)
MCH: 31.8 pg (ref 26.0–34.0)
MCHC: 33.9 g/dL (ref 32.0–36.0)
MCV: 93.8 fL (ref 80.0–100.0)
MONO ABS: 1.8 10*3/uL — AB (ref 0.2–0.9)
Monocytes Relative: 11 %
Neutro Abs: 13.4 10*3/uL — ABNORMAL HIGH (ref 1.4–6.5)
Neutrophils Relative %: 80 %
PLATELETS: 445 10*3/uL — AB (ref 150–440)
RBC: 3.69 MIL/uL — ABNORMAL LOW (ref 3.80–5.20)
RDW: 15.5 % — AB (ref 11.5–14.5)
WBC: 16.8 10*3/uL — ABNORMAL HIGH (ref 3.6–11.0)

## 2016-06-24 LAB — TSH: TSH: 2.384 u[IU]/mL (ref 0.350–4.500)

## 2016-06-24 MED ORDER — SODIUM CHLORIDE 0.9% FLUSH
10.0000 mL | Freq: Once | INTRAVENOUS | Status: AC
  Administered 2016-06-24: 10 mL via INTRAVENOUS
  Filled 2016-06-24: qty 10

## 2016-06-24 MED ORDER — HEPARIN SOD (PORK) LOCK FLUSH 100 UNIT/ML IV SOLN
500.0000 [IU] | Freq: Once | INTRAVENOUS | Status: AC
Start: 2016-06-24 — End: 2016-06-24
  Administered 2016-06-24: 500 [IU] via INTRAVENOUS

## 2016-06-24 MED ORDER — HEPARIN SOD (PORK) LOCK FLUSH 100 UNIT/ML IV SOLN
INTRAVENOUS | Status: AC
  Filled 2016-06-24: qty 5

## 2016-06-24 MED ORDER — SODIUM CHLORIDE 0.9 % IV SOLN
240.0000 mg | Freq: Once | INTRAVENOUS | Status: AC
  Administered 2016-06-24: 240 mg via INTRAVENOUS
  Filled 2016-06-24: qty 20

## 2016-06-24 MED ORDER — SODIUM CHLORIDE 0.9 % IV SOLN
Freq: Once | INTRAVENOUS | Status: AC
  Administered 2016-06-24: 12:00:00 via INTRAVENOUS
  Filled 2016-06-24: qty 1000

## 2016-06-24 NOTE — Assessment & Plan Note (Addendum)
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  DEC 2017- Chest CT- Partial response. ; MRI DEC 2017- Brain metastasis- small new brain mets- evaluated by Dr.Crystal. No RT now; MRI Jan 2018 showed increasing size of metswith worsening edema.  # Progression- in brain noted on MRI brain from hospital- Patient met with Hospice services but states she is not ready for their services. Opdivo every 2 weeks was recommended during last visit but patient does not remember this conversation. Recommend trialing Opdivo every two weeks. A lengthy discussion was had with patient and aunt regarding the treatment an potential side effects were discussed. Proceed with Cycle 1 Obdivo today. Labs are adequate for treatment.   # Leukocytosis: WBC slightly elevated today at 16.8. Probably steroid related. R/O UTI.   # Oral thrush- Does not complain of this today. Use nystatin PRN. Taper steroids from 2 mg to 6m. Currently taking 2 mg Decadron once a day. Continue 2 mg for one week and then taper to 1 mg until discontinued.   # Bil tremors- Stable. Due to steroids.  # Dehydration- Continue drinking plenty of fluids.    # PN- Better. Continue PRN medications. Needs refill of Lyrica.  # Dysuria/urgency/frequency/odor: UA ordered.   # Reviewed/counselled regarding the goals of care- being palliative/treatment are usually indefinite-until progression or side effects. Goal is to maintain quality of life as the disease is incurable. Discussed with the patient that we will continue treatment only if her performance status improves and she tolerates cycle 1 of Opdivo. If she continues to decline, hospice is recommended. Unfortunately patient has poor social support. She would like to explore life [facility] and what they have to offer.   # follow up in 2 weeks/labs/ opdivo.

## 2016-06-24 NOTE — Progress Notes (Signed)
Patient here today for follow up.  Patient c/o dysuria, frequency, odor that started this morning.  Needs refill on lyrica

## 2016-06-24 NOTE — Progress Notes (Signed)
[place] CONSULT NOTE  Patient Care Team: Elgie Collard, MD as PCP - General (Obstetrics and Gynecology) Christene Lye, MD (General Surgery)  CHIEF COMPLAINTS/PURPOSE OF CONSULTATION:   Oncology History   # FEB 2017- ADENOCARCINOMA RUL [T2N2; STAGE III Bronch/FNA- Right paratracheal LN;Dr.Ram]. PET- no distant mets; March 3rd START CARBO-TAXOL Weekly +RT [until may 5th]; June 21st 2017- last dose of consolidation carbo-taxol; Jan 12 2016- PET- Improved on RUL mass/LN  # FEB 2017- MRI Brain 40m focus of met [Right frontal]; Jan 12 2016- 3 new brain mets [start WBRT 8/31]; MRI- DEC 2017- New occipital met-~539m RT on HOLD.   # JAN 2018- START OPIDIVO  # left breast abscess; Aug 2017- PET ? Right Breast abcess  # "Chronic pancreatitis"/ smoker [15ppt]   # MOLECULAR STUDIES: ALK-NEG;ROS-1-NEG     [place] [facility] [place]  07/[place]/2015 Initial Diagnosis    Malignant neoplasm of right upper lobe of lung ([facility])        HISTORY OF PRESENTING ILLNESS:  Tiffany Erickson [age].o.  female  history of stage IV lung cancer adenocarcinoma- Currently on surveillance is here for follow-up.  The patient was recently evaluated for new brain metastases by radiation oncology. Given the small brain metastases and the recent whole brain radiation currently on hold.  Patient feels much better today. She was recently discharged from the hospital on January 11 th due to a fall at home. She was admitted for fall and altered mental status. She does not recall the fall and does not remember most of her hospital stay. An MRI brain suggested  interval increase in size of metswith worsening edema and she was ultimately sent home with home hospice and steroids. She states that her appetite is better. She states she met with hospice after her discharge and is not ready for hospice at this point. She wishes to start back with PT and have a home health aide come to  her house twice a week. She has not fallen since the hospital she is interested in additional options to help with her cancer.  Denies any fevers or chills.  No cough or hemoptysis.  Continues to chronic abdominal discomfort; Chronic nausea. She complains of bilateral hand tremors. She complains of increase urinary frequency, dysuria and odor. She thinks she is getting a UTI and would like to be evaluated for this.    ROS: A complete 10 point review of system is done which is negative except mentioned above in history of present illness  MEDICAL HISTORY:  Past Medical History:  Diagnosis Date  . Anxiety   . Broken ribs 05/2015   5 ribs  . [place]itis ([facility]  . Concussion 06/01/2015   S/P fall-PT STATES IN PHONE INTERVIEW THAT SHE IS UNSURE IF SHE HAD A CONCUSSION OR NOT  . Frequent UTI    "sometimes" (06/01/2015)  . GERD (gastroesophageal reflux disease)   . Lung cancer ([facility]   METASTATIC ADENOCARCINOMA OF THE LUNG  . Lung mass    right side noted 06/01/2015  . Multiple fractures of ribs of right side 06/01/2015   "5" S/P fall  LEFT SIDE  . Shortness of breath dyspnea    5 rib fx right side    SURGICAL HISTORY: Past Surgical History:  Procedure Laterality Date  . ENDOBRONCHIAL ULTRASOUND N/A 07/13/2015   Procedure: ENDOBRONCHIAL ULTRASOUND;  Surgeon: PrLaverle HobbyMD;  Location: [facility] ORS;  Service: Pulmonary;  Laterality:  N/A;  . ESOPHAGOGASTRODUODENOSCOPY    . EYE MUSCLE SURGERY Left ~ 1979  . MYRINGOTOMY WITH TUBE PLACEMENT Bilateral   . PORTACATH PLACEMENT Left 07/27/2015   Procedure: INSERTION PORT-A-CATH;  Surgeon: [name], MD;  Location: [facility] ORS;  Service: General;  Laterality: Left;  . TONSILLECTOMY      SOCIAL HISTORY: Social History   Social History  . Marital status: Single    Spouse name: N/A  . Number of children: N/A  . Years of education: N/A   Occupational History  . Not on file.   Social History Main Topics  . Smoking  status: Current Every Day Smoker    Packs/day: 0.50    Years: 28.00    Types: Cigarettes  . Smokeless tobacco: Never Used  . Alcohol use 8.4 oz/week    2 Glasses of wine, 12 Cans of beer per week     Comment: 06/01/2015 "drink probably 4 times/wk; either ;4 beers or 2 glasses of wine when I do drink"  . Drug use: No  . Sexual activity: Not Currently   Other Topics Concern  . Not on file   Social History Narrative  . No narrative on file    FAMILY HISTORY: Family History  Problem Relation Age of Onset  . Colon cancer Maternal Grandfather   . CAD Maternal Grandfather   . Pancreatic cancer Maternal Grandmother     ALLERGIES:  is allergic to sulfa antibiotics.  MEDICATIONS:  Current Outpatient Prescriptions  Medication Sig Dispense Refill  . albuterol (PROVENTIL HFA;VENTOLIN HFA) 108 (90 Base) MCG/ACT inhaler Inhale 2 puffs into the lungs every 6 (six) hours as needed for wheezing or shortness of breath. 1 Inhaler 2  . ALPRAZolam (XANAX) 0.5 MG tablet Take 1 tablet (0.5 mg total) by mouth 3 (three) times daily as needed for anxiety. 15 tablet 0  . benzonatate (TESSALON) 200 MG capsule Take 200 mg by mouth 2 (two) times daily as needed for cough.    . dexamethasone (DECADRON) 4 MG tablet Take 1 tablet (4 mg total) by mouth 2 (two) times daily. 60 tablet 0  . fentaNYL (DURAGESIC - DOSED MCG/HR) 50 MCG/HR Place 1 patch (50 mcg total) onto the skin every 3 (three) days. 10 patch 0  . HYDROcodone-acetaminophen (NORCO/VICODIN) 5-325 MG tablet Take 1 tablet by mouth every 8 (eight) hours as needed for moderate pain. 90 tablet 0  . lidocaine-prilocaine (EMLA) cream Apply 1 application topically as needed. 30 g 6  . LYRICA 75 MG capsule TAKE ONE CAPSULE BY MOUTH TWICE DAILY 60 capsule 0  . metoCLOPramide (REGLAN) 10 MG tablet Take 1 tablet (10 mg total) by mouth every 8 (eight) hours as needed for nausea or vomiting. 10 tablet 0  . nystatin (MYCOSTATIN) 100000 UNIT/ML suspension Take 5 mLs  (500,000 Units total) by mouth 4 (four) times daily. 60 mL 3  . ondansetron (ZOFRAN) 8 MG tablet Take 1 tablet (8 mg total) by mouth every 8 (eight) hours as needed for nausea or vomiting (start 3 days; after chemo). 40 tablet 6  . pantoprazole (PROTONIX) 20 MG tablet Take 20 mg by mouth 2 (two) times daily.     . pramipexole (MIRAPEX) 0.5 MG tablet TAKE 1 TABLET BY MOUTH AT BEDTIME  2  . promethazine (PHENERGAN) 25 MG tablet Take 25 mg by mouth every 8 (eight) hours as needed for nausea or vomiting.     No current facility-administered medications for this visit.    Facility-Administered Medications Ordered in Other  Visits  Medication Dose Route Frequency Provider Last Rate Last Dose  . sodium chloride flush (NS) 0.9 % injection 10 mL  10 mL Intravenous PRN Cammie Sickle, MD   10 mL at 03/02/16 1107      .  PHYSICAL EXAMINATION: ECOG PERFORMANCE STATUS: 0 - Asymptomatic  Vitals:   06/24/16 1113  BP: 106/71  Pulse: 99  Temp: 97.7 F (36.5 C)   Filed Weights   06/24/16 1113  Weight: 132 lb 6 oz (60 kg)    GENERAL: Thin build; cachectic-appearing female patient Alert, no distress and comfortable.She Accompanied by her aunt.  Patient is in a wheelchair. EYES: no pallor or icterus OROPHARYNX: No thrush significantly improved. 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 mediport- no signs of infection.   LABORATORY DATA:  I have reviewed the data as listed Lab Results  Component Value Date   WBC  16.8 (H) 06/24/2016   HGB 11.7 (L) 06/24/2016   HCT 34.6 (L) 06/24/2016   MCV 93.8 06/24/2016   PLT 445 (H) 06/24/2016    Recent Labs  03/02/16 1050  05/19/16 0850  06/15/16 0444 06/16/16 0303 06/24/16 1040  NA 134*  --  134*  < > 131* 134* 136  K 3.4*  --  2.6*  < > 4.6 3.4* 3.4*  CL 97*  --  95*  < > 100* 100* 103  CO2 24  --  26  < > 22 26 23   GLUCOSE 147*  --  146*  < > 128* 122* 93  BUN 11  --  <5*  < > 9 7 11   CREATININE 0.58  < > 0.95  < > 0.53 0.43* 0.59  CALCIUM 9.0  --  9.5  < > 8.6* 8.9 9.1  GFRNONAA >60  --  >60  < > >60 >60 >60  GFRAA >60  --  >60  < > >60 >60 >60  PROT 6.6  --  7.2  --   --   --  6.7  ALBUMIN 3.5  --  4.1  --   --   --  3.7  AST 27  --  27  --   --   --  22  ALT 20  --  11*  --   --   --  17  ALKPHOS 56  --  45  --   --   --  45  BILITOT 0.6  --  0.6  --   --   --  0.3  < > = values in this interval not displayed.   ASSESSMENT & PLAN:   Malignant neoplasm of right upper lobe of lung ([place])  # STAGE IV [tiny focus of brain met] Adenocarcinoma T2N2; right upper lobe; s/p chemo-RT.  DEC 2017- Chest CT- Partial response. ; MRI DEC 2017- Brain metastasis- small new brain mets- evaluated by Dr.Crystal. No RT now; MRI Jan 2018 showed increasing size of metswith worsening edema.  # Progression- in brain noted on MRI brain from hospital- Patient met with Hospice services but states she is  not ready for their services. Opdivo every 2 weeks was recommended during last visit but patient does not remember this conversation. Recommend trialing Opdivo every two weeks. A lengthy discussion was had with patient and aunt regarding the treatment an potential side effects were discussed. Proceed with Cycle 1 Obdivo today. Labs are adequate for treatment.   # Leukocytosis: WBC slightly elevated today at 16.8. Probably steroid related. R/O UTI.   # Oral thrush- Does not complain of this today. Use nystatin PRN. Taper steroids from 2 mg to 53m. Currently taking 2 mg  Decadron once a day. Continue 2 mg for one week and then taper to 1 mg until discontinued.   # Bil tremors- Stable. Due to steroids.  # Dehydration- Continue drinking plenty of fluids.    # PN- Better. Continue PRN medications. Needs refill of Lyrica.  # Dysuria/urgency/frequency/odor: UA ordered.   # Reviewed/counselled regarding the goals of care- being palliative/treatment are usually indefinite-until progression or side effects. Goal is to maintain quality of life as the disease is incurable. Discussed with the patient that we will continue treatment only if her performance status improves and she tolerates cycle 1 of Opdivo. If she continues to decline, hospice is recommended. Unfortunately patient has poor social support. She would like to explore life [facility] and what they have to offer.   # follow up in 2 weeks/labs/ opdivo.   JFaythe Casa NP    GCammie Sickle MD 06/24/2016 3:49 PM

## 2016-06-27 ENCOUNTER — Telehealth: Payer: Self-pay | Admitting: *Deleted

## 2016-06-27 NOTE — Telephone Encounter (Signed)
Prior auth for lyrica initiated on 06/27/16 with [employer]. Pending approval.

## 2016-06-28 ENCOUNTER — Telehealth: Payer: Self-pay | Admitting: *Deleted

## 2016-06-28 ENCOUNTER — Other Ambulatory Visit: Payer: Self-pay | Admitting: *Deleted

## 2016-06-28 DIAGNOSIS — C3411 Malignant neoplasm of upper lobe, right bronchus or lung: Secondary | ICD-10-CM

## 2016-06-28 MED ORDER — PREGABALIN 75 MG PO CAPS: 75.0000 mg | ORAL_CAPSULE | Freq: Two times a day (BID) | ORAL | 0 refills | Status: DC

## 2016-06-28 NOTE — Telephone Encounter (Signed)
Patient states she is out of Lyrica and needs an emergency prescription through February 1st.  She will see a new PCP then.

## 2016-06-28 NOTE — Telephone Encounter (Signed)
[place]. V/o order obtained to call in Lyrica.  15 days supplied approved by [employer] for [employer] to cover cost.

## 2016-07-07 ENCOUNTER — Encounter: Payer: Self-pay | Admitting: Family Medicine

## 2016-07-07 ENCOUNTER — Other Ambulatory Visit: Payer: Self-pay | Admitting: *Deleted

## 2016-07-07 ENCOUNTER — Ambulatory Visit (INDEPENDENT_AMBULATORY_CARE_PROVIDER_SITE_OTHER): Payer: Medicaid Other | Admitting: Family Medicine

## 2016-07-07 VITALS — BP 120/80 | HR 70

## 2016-07-07 DIAGNOSIS — R109 Unspecified abdominal pain: Secondary | ICD-10-CM

## 2016-07-07 DIAGNOSIS — K3184 Gastroparesis: Secondary | ICD-10-CM | POA: Diagnosis not present

## 2016-07-07 DIAGNOSIS — Z0000 Encounter for general adult medical examination without abnormal findings: Secondary | ICD-10-CM

## 2016-07-07 DIAGNOSIS — C3411 Malignant neoplasm of upper lobe, right bronchus or lung: Secondary | ICD-10-CM

## 2016-07-07 DIAGNOSIS — R05 Cough: Secondary | ICD-10-CM | POA: Diagnosis not present

## 2016-07-07 DIAGNOSIS — R053 Chronic cough: Secondary | ICD-10-CM

## 2016-07-07 DIAGNOSIS — K219 Gastro-esophageal reflux disease without esophagitis: Secondary | ICD-10-CM

## 2016-07-07 MED ORDER — BENZONATATE 200 MG PO CAPS: 200.0000 mg | ORAL_CAPSULE | Freq: Two times a day (BID) | ORAL | 0 refills | Status: DC | PRN

## 2016-07-07 MED ORDER — METOCLOPRAMIDE HCL 10 MG PO TABS: 10.0000 mg | ORAL_TABLET | Freq: Three times a day (TID) | ORAL | 5 refills | Status: AC | PRN

## 2016-07-07 MED ORDER — PANTOPRAZOLE SODIUM 20 MG PO TBEC: 20.0000 mg | DELAYED_RELEASE_TABLET | Freq: Two times a day (BID) | ORAL | 6 refills | Status: AC

## 2016-07-07 NOTE — Patient Instructions (Signed)
Helicobacter Pylori Antibodies Test Why am I having this test? This is a blood test that looks for bacteria called Helicobacter pylori (H. pylori). H. pylori is a germ that can be found in the cells that line the stomach. Having high levels of H. pylori in your stomach puts you at risk for stomach ulcers and small bowel ulcers, long-term (chronic) inflammation of the lining of the stomach, or even ulcers that may occur in the canal that runs from the mouth to the stomach (esophagus). Presence of H. pylori can also increase your risk for stomach cancer if it is left untreated. Most people with H. pylori in their stomach bacteria have no symptoms. Your health care provider may ask you to have this test if you have symptoms of a stomach ulcer or small bowel ulcer, such as stomach pain before or after eating, heartburn, or nausea repeatedly after eating. What kind of sample is taken? A blood sample is required for this test. It is usually collected by inserting a needle into a vein or sticking a finger with a small needle. How do I prepare for this test? There is no preparation required for this test. What are the reference ranges? Reference ranges are considered healthy ranges established after testing a large group of healthy people. Reference ranges may vary among different people, labs, and hospitals. It is your responsibility to obtain your test results. Ask the lab or department performing the test when and how you will get your results. Your test results will be reported as positive, negative, or equivocal. Equivocal means that your results are neither positive nor negative. References ranges for these results are as follows:  Less than or equal to 30 units/mL. This is negative.  Greater than or equal to 40 units/mL. This is positive.  30.01-39.99 units/mL. This is equivocal. What do the results mean? Test results that are higher than normal may indicate numerous health conditions. These may  include:  Short-term or long-term irritation of the stomach lining (gastritis).  Small bowel ulcer.  Stomach ulcer.  Stomach cancer. Talk with your health care provider to discuss your results, treatment options, and if necessary, the need for more tests. Talk with your health care provider if you have any questions about your results. Talk with your health care provider to discuss your results, treatment options, and if necessary, the need for more tests. Talk with your health care provider if you have any questions about your results. This information is not intended to replace advice given to you by your health care provider. Make sure you discuss any questions you have with your health care provider. Document Released: 06/16/2004 Document Revised: 01/27/2016 Document Reviewed: 10/04/2013 Elsevier Interactive Patient Education  2017 [employer] Gastroparesis Introduction Gastroparesis, also called delayed gastric emptying, is a condition in which food takes longer than normal to empty from the stomach. The condition is usually long-lasting (chronic). What are the causes? This condition may be caused by:  An endocrine disorder, such as hypothyroidism or diabetes. Diabetes is the most common cause of this condition.  A nervous system disease, such as Parkinson disease or multiple sclerosis.  Cancer, infection, or surgery of the stomach or vagus nerve.  A connective tissue disorder, such as scleroderma.  Certain medicines. In most cases, the cause is not known. What increases the risk? This condition is more likely to develop in:  People with certain disorders, including endocrine disorders, eating disorders, amyloidosis, and scleroderma.  People with certain diseases, including Parkinson disease  or multiple sclerosis.  People with cancer or infection of the stomach or vagus nerve.  People who have had surgery on the stomach or vagus nerve.  People who take certain  medicines.  Women. What are the signs or symptoms? Symptoms of this condition include:  An early feeling of fullness when eating.  Nausea.  Weight loss.  Vomiting.  Heartburn.  Abdominal bloating.  Inconsistent blood glucose levels.  Lack of appetite.  Acid from the stomach coming up into the esophagus (gastroesophageal reflux).  Spasms of the stomach. Symptoms may come and go. How is this diagnosed? This condition is diagnosed with tests, such as:  Tests that check how long it takes food to move through the stomach and intestines. These tests include:  Upper gastrointestinal (GI) series. In this test, X-rays of the intestines are taken after you drink a liquid. The liquid makes the intestines show up better on the X-rays.  Gastric emptying scintigraphy. In this test, scans are taken after you eat food that contains a small amount of radioactive material.  Wireless capsule GI monitoring system. This test involves swallowing a capsule that records information about movement through the stomach.  Gastric manometry. This test measures electrical and muscular activity in the stomach. It is done with a thin tube that is passed down the throat and into the stomach.  Endoscopy. This test checks for abnormalities in the lining of the stomach. It is done with a long, thin tube that is passed down the throat and into the stomach.  An ultrasound. This test can help rule out gallbladder disease or pancreatitis as a cause of your symptoms. It uses sound waves to take pictures of the inside of your body. How is this treated? There is no cure for gastroparesis. This condition may be managed with:  Treatment of the underlying condition causing the gastroparesis.  Lifestyle changes, including exercise and dietary changes. Dietary changes can include:  Changes in what and when you eat.  Eating smaller meals more often.  Eating low-fat foods.  Eating low-fiber forms of high-fiber  foods, such as cooked vegetables instead of raw vegetables.  Having liquid foods in place of solid foods. Liquid foods are easier to digest.  Medicines. These may be given to control nausea and vomiting and to stimulate stomach muscles.  Getting food through a feeding tube. This may be done in severe cases.  A gastric neurostimulator. This is a device that is inserted into the body with surgery. It helps improve stomach emptying and control nausea and vomiting. Follow these instructions at home:  Follow your health care provider's instructions about exercise and diet.  Take medicines only as directed by your health care provider. Contact a health care provider if:  Your symptoms do not improve with treatment.  You have new symptoms. Get help right away if:  You have severe abdominal pain that does not improve with treatment.  You have nausea that does not go away.  You cannot keep fluids down. This information is not intended to replace advice given to you by your health care provider. Make sure you discuss any questions you have with your health care provider. Document Released: 05/23/2005 Document Revised: 10/29/2015 Document Reviewed: 05/19/2014  2017 Elsevier

## 2016-07-07 NOTE — Progress Notes (Signed)
Name: Tiffany Erickson   MRN: [id number]    DOB: 08-02-1970   Date:07/07/2016       Progress Note  Subjective  Chief Complaint  Chief Complaint  Patient presents with  . Establish Care  . Gastroesophageal Reflux    needs refill on Pantoprazole and reglan    Gastroesophageal Reflux  She complains of heartburn. She reports no abdominal pain, no belching, no chest pain, no choking, no coughing, no dysphagia, no early satiety, no globus sensation, no hoarse voice, no nausea, no sore throat, no stridor, no tooth decay, no water brash or no wheezing. This is a recurrent problem. The current episode started more than 1 year ago. The problem occurs frequently. The problem has been waxing and waning. The heartburn duration is several minutes. The heartburn is located in the substernum (epigastric). The heartburn is of mild intensity. The heartburn wakes her from sleep. The symptoms are aggravated by certain foods. Pertinent negatives include no anemia, fatigue, melena, muscle weakness, orthopnea or weight loss. There are no known risk factors. She has tried a PPI for the symptoms. The treatment provided moderate relief. Past procedures include a UGI. Past procedures do not include an abdominal ultrasound.    No problem-specific Assessment & Plan notes found for this encounter.   Past Medical History:  Diagnosis Date  . Anxiety   . Broken ribs 05/2015   5 ribs  . Chronic pancreatitis ([place])   . Concussion 06/01/2015   S/P fall-PT STATES IN PHONE INTERVIEW THAT SHE IS UNSURE IF SHE HAD A CONCUSSION OR NOT  . Frequent UTI    "sometimes" (06/01/2015)  . GERD (gastroesophageal reflux disease)   . Lung cancer ([place]) 07/13/15   METASTATIC ADENOCARCINOMA OF THE LUNG  . Lung mass    right side noted 06/01/2015  . Multiple fractures of ribs of right side 06/01/2015   "5" S/P fall  LEFT SIDE  . Shortness of breath dyspnea    5 rib fx right side    Past Surgical History:  Procedure Laterality Date  .  ENDOBRONCHIAL ULTRASOUND N/A 07/13/2015   Procedure: ENDOBRONCHIAL ULTRASOUND;  Surgeon: Laverle Hobby, MD;  Location: [facility] ORS;  Service: Pulmonary;  Laterality: N/A;  . ESOPHAGOGASTRODUODENOSCOPY    . EYE MUSCLE SURGERY Left ~ 1979  . MYRINGOTOMY WITH TUBE PLACEMENT Bilateral   . PORTACATH PLACEMENT Left 07/27/2015   Procedure: INSERTION PORT-A-CATH;  Surgeon: [name], MD;  Location: [facility] ORS;  Service: General;  Laterality: Left;  . TONSILLECTOMY      Family History  Problem Relation Age of Onset  . Colon cancer Maternal Grandfather   . CAD Maternal Grandfather   . Pancreatic cancer Maternal Grandmother     Social History   Social History  . Marital status: Single    Spouse name: N/A  . Number of children: N/A  . Years of education: N/A   Occupational History  . Not on file.   Social History Main Topics  . Smoking status: Current Every Day Smoker    Packs/day: 0.50    Years: 28.00    Types: Cigarettes  . Smokeless tobacco: Never Used  . Alcohol use 8.4 oz/week    2 Glasses of wine, 12 Cans of beer per week     Comment: 06/01/2015 "drink probably 4 times/wk; either ;4 beers or 2 glasses of wine when I do drink"  . Drug use: No  . Sexual activity: Not Currently   Other Topics Concern  . Not on file  Social History Narrative  . No narrative on file    Allergies  Allergen Reactions  . Sulfa Antibiotics Other (See Comments)    Unknown-last had as a child     Review of Systems  Constitutional: Negative for chills, fatigue, fever, malaise/fatigue and weight loss.  HENT: Negative for ear discharge, ear pain, hoarse voice and sore throat.   Eyes: Negative for blurred vision.  Respiratory: Negative for cough, sputum production, choking, shortness of breath and wheezing.   Cardiovascular: Negative for chest pain, palpitations and leg swelling.  Gastrointestinal: Positive for heartburn. Negative for abdominal pain, blood in stool, constipation, diarrhea,  dysphagia, melena, nausea and vomiting.  Genitourinary: Negative for dysuria, frequency, hematuria and urgency.  Musculoskeletal: Negative for back pain, joint pain, myalgias, muscle weakness and neck pain.  Skin: Negative for rash.  Neurological: Negative for dizziness, tingling, sensory change, focal weakness and headaches.  Endo/Heme/Allergies: Negative for environmental allergies and polydipsia. Does not bruise/bleed easily.  Psychiatric/Behavioral: Negative for depression and suicidal ideas. The patient is not nervous/anxious and does not have insomnia.      Objective  Vitals:   07/07/16 1524  BP: 120/80  Pulse: 70    Physical Exam  Constitutional: She is well-developed, well-nourished, and in no distress. No distress.  HENT:  Head: Normocephalic and atraumatic.  Right Ear: External ear normal.  Left Ear: External ear normal.  Nose: Nose normal.  Mouth/Throat: Oropharynx is clear and moist.  Eyes: Conjunctivae and EOM are normal. Pupils are equal, round, and reactive to light. Right eye exhibits no discharge. Left eye exhibits no discharge.  Neck: Normal range of motion. Neck supple. No JVD present. No thyromegaly present.  Cardiovascular: Normal rate, regular rhythm, normal heart sounds and intact distal pulses.  Exam reveals no gallop and no friction rub.   No murmur heard. Pulmonary/Chest: Effort normal and breath sounds normal.  Abdominal: Soft. Bowel sounds are normal. She exhibits no mass. There is no tenderness. There is no guarding.  Musculoskeletal: Normal range of motion. She exhibits no edema.  Lymphadenopathy:    She has no cervical adenopathy.  Neurological: She is alert.  Skin: Skin is warm and dry. She is not diaphoretic.  Psychiatric: Mood and affect normal.  Nursing note and vitals reviewed.     Assessment & Plan  Problem List Items Addressed This Visit      Digestive   GERD (gastroesophageal reflux disease)   Relevant Medications   pantoprazole  (PROTONIX) 20 MG tablet   metoCLOPramide (REGLAN) 10 MG tablet   Other Relevant Orders   H. pylori antibody, IgG    Other Visit Diagnoses    Encounter for medical examination to establish care    -  Primary   Gastroparesis       Relevant Medications   metoCLOPramide (REGLAN) 10 MG tablet   Other Relevant Orders   H. pylori antibody, IgG   Recurrent abdominal pain       Relevant Medications   pantoprazole (PROTONIX) 20 MG tablet   metoCLOPramide (REGLAN) 10 MG tablet   Other Relevant Orders   H. pylori antibody, IgG   Chronic cough       Relevant Medications   pantoprazole (PROTONIX) 20 MG tablet   metoCLOPramide (REGLAN) 10 MG tablet   benzonatate (TESSALON) 200 MG capsule        Dr. Deanna Jones [facility]  07/07/16

## 2016-07-08 ENCOUNTER — Inpatient Hospital Stay: Payer: Medicaid Other | Attending: Internal Medicine

## 2016-07-08 ENCOUNTER — Inpatient Hospital Stay: Payer: Medicaid Other

## 2016-07-08 ENCOUNTER — Inpatient Hospital Stay (HOSPITAL_BASED_OUTPATIENT_CLINIC_OR_DEPARTMENT_OTHER): Payer: Medicaid Other | Admitting: Internal Medicine

## 2016-07-08 DIAGNOSIS — K219 Gastro-esophageal reflux disease without esophagitis: Secondary | ICD-10-CM

## 2016-07-08 DIAGNOSIS — T380X5A Adverse effect of glucocorticoids and synthetic analogues, initial encounter: Secondary | ICD-10-CM | POA: Insufficient documentation

## 2016-07-08 DIAGNOSIS — F17210 Nicotine dependence, cigarettes, uncomplicated: Secondary | ICD-10-CM

## 2016-07-08 DIAGNOSIS — R600 Localized edema: Secondary | ICD-10-CM | POA: Diagnosis not present

## 2016-07-08 DIAGNOSIS — C3411 Malignant neoplasm of upper lobe, right bronchus or lung: Secondary | ICD-10-CM

## 2016-07-08 DIAGNOSIS — Z9221 Personal history of antineoplastic chemotherapy: Secondary | ICD-10-CM | POA: Diagnosis not present

## 2016-07-08 DIAGNOSIS — Z923 Personal history of irradiation: Secondary | ICD-10-CM | POA: Diagnosis not present

## 2016-07-08 DIAGNOSIS — C771 Secondary and unspecified malignant neoplasm of intrathoracic lymph nodes: Secondary | ICD-10-CM | POA: Diagnosis not present

## 2016-07-08 DIAGNOSIS — R000 Tachycardia, unspecified: Secondary | ICD-10-CM | POA: Diagnosis not present

## 2016-07-08 DIAGNOSIS — E860 Dehydration: Secondary | ICD-10-CM

## 2016-07-08 DIAGNOSIS — R2681 Unsteadiness on feet: Secondary | ICD-10-CM

## 2016-07-08 DIAGNOSIS — F419 Anxiety disorder, unspecified: Secondary | ICD-10-CM | POA: Diagnosis not present

## 2016-07-08 DIAGNOSIS — K861 Other chronic pancreatitis: Secondary | ICD-10-CM | POA: Insufficient documentation

## 2016-07-08 DIAGNOSIS — Z5112 Encounter for antineoplastic immunotherapy: Secondary | ICD-10-CM | POA: Insufficient documentation

## 2016-07-08 DIAGNOSIS — Z79899 Other long term (current) drug therapy: Secondary | ICD-10-CM | POA: Insufficient documentation

## 2016-07-08 DIAGNOSIS — R251 Tremor, unspecified: Secondary | ICD-10-CM | POA: Diagnosis not present

## 2016-07-08 DIAGNOSIS — Z800 Family history of malignant neoplasm of digestive organs: Secondary | ICD-10-CM | POA: Insufficient documentation

## 2016-07-08 DIAGNOSIS — C7931 Secondary malignant neoplasm of brain: Secondary | ICD-10-CM | POA: Diagnosis not present

## 2016-07-08 DIAGNOSIS — Z7952 Long term (current) use of systemic steroids: Secondary | ICD-10-CM | POA: Insufficient documentation

## 2016-07-08 DIAGNOSIS — G4700 Insomnia, unspecified: Secondary | ICD-10-CM | POA: Insufficient documentation

## 2016-07-08 LAB — COMPREHENSIVE METABOLIC PANEL
ALBUMIN: 3.6 g/dL (ref 3.5–5.0)
ALK PHOS: 53 U/L (ref 38–126)
ALT: 14 U/L (ref 14–54)
ANION GAP: 7 (ref 5–15)
AST: 17 U/L (ref 15–41)
BILIRUBIN TOTAL: 0.5 mg/dL (ref 0.3–1.2)
BUN: 13 mg/dL (ref 6–20)
CALCIUM: 9 mg/dL (ref 8.9–10.3)
CO2: 25 mmol/L (ref 22–32)
Chloride: 102 mmol/L (ref 101–111)
Creatinine, Ser: 0.43 mg/dL — ABNORMAL LOW (ref 0.44–1.00)
GFR calc Af Amer: >60 mL/min (ref >60)
GFR calc non Af Amer: >60 mL/min (ref >60)
GLUCOSE: 102 mg/dL — AB (ref 65–99)
POTASSIUM: 3.6 mmol/L (ref 3.5–5.1)
SODIUM: 134 mmol/L — AB (ref 135–145)
TOTAL PROTEIN: 6.5 g/dL (ref 6.5–8.1)

## 2016-07-08 LAB — CBC WITH DIFFERENTIAL/PLATELET
BASOS ABS: 0 10*3/uL (ref 0–0.1)
BASOS PCT: 0 %
EOS ABS: 0.1 10*3/uL (ref 0–0.7)
Eosinophils Relative: 2 %
HEMATOCRIT: 34.9 % — AB (ref 35.0–47.0)
HEMOGLOBIN: 11.9 g/dL — AB (ref 12.0–16.0)
Lymphocytes Relative: 13 %
Lymphs Abs: 1.1 10*3/uL (ref 1.0–3.6)
MCH: 32.6 pg (ref 26.0–34.0)
MCHC: 34 g/dL (ref 32.0–36.0)
MCV: 95.9 fL (ref 80.0–100.0)
Monocytes Absolute: 0.8 10*3/uL (ref 0.2–0.9)
Monocytes Relative: 9 %
NEUTROS ABS: 6.5 10*3/uL (ref 1.4–6.5)
NEUTROS PCT: 76 %
Platelets: 325 10*3/uL (ref 150–440)
RBC: 3.64 MIL/uL — ABNORMAL LOW (ref 3.80–5.20)
RDW: 17.9 % — ABNORMAL HIGH (ref 11.5–14.5)
WBC: 8.6 10*3/uL (ref 3.6–11.0)

## 2016-07-08 LAB — H. PYLORI ANTIBODY, IGG

## 2016-07-08 MED ORDER — HEPARIN SOD (PORK) LOCK FLUSH 100 UNIT/ML IV SOLN
500.0000 [IU] | Freq: Once | INTRAVENOUS | Status: AC | PRN
  Administered 2016-07-08: 500 [IU]
  Filled 2016-07-08: qty 5

## 2016-07-08 MED ORDER — SODIUM CHLORIDE 0.9% FLUSH
10.0000 mL | INTRAVENOUS | Status: DC | PRN
  Administered 2016-07-08: 10 mL
  Filled 2016-07-08: qty 10

## 2016-07-08 MED ORDER — GABAPENTIN 300 MG PO CAPS: 300.0000 mg | ORAL_CAPSULE | Freq: Two times a day (BID) | ORAL | 2 refills | Status: AC

## 2016-07-08 MED ORDER — SODIUM CHLORIDE 0.9 % IV SOLN
Freq: Once | INTRAVENOUS | Status: AC
  Administered 2016-07-08: 11:00:00 via INTRAVENOUS
  Filled 2016-07-08: qty 1000

## 2016-07-08 MED ORDER — SODIUM CHLORIDE 0.9 % IV SOLN
240.0000 mg | Freq: Once | INTRAVENOUS | Status: AC
  Administered 2016-07-08: 240 mg via INTRAVENOUS
  Filled 2016-07-08: qty 24

## 2016-07-08 NOTE — Progress Notes (Signed)
Patient here today for follow up.   

## 2016-07-08 NOTE — Assessment & Plan Note (Addendum)
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  DEC 2017- Chest CT- Partial response. ; MRI DEC 2017- Brain metastasis- small new brain mets- evaluated by Dr.Crystal. No RT now; MRI Jan 2018 showed increasing size of metswith worsening edema.  #Continue Opdivo every 2 weeks. Labs are adequate for treatment.   # Leukocytosis: Resolved. Should no longer be on steroids after today.   # Oral thrush- Resolved.   # Bil tremors- Much better.  Due to steroids.  # Dehydration/Tachycardia- Continue drinking plenty of fluids.   # Clumsy/Unsetadiness: Radiation/Mets/Chemo. Encouraged slow position changes and the use of a cane or walker. Encouraged PT.    # PN- Better. Continue PRN medications. Switch to Gabapentin. Lyrica was not covered by insurance. New prescription given.   # Dysuria/urgency/frequency/odor: Resolved. UA negative during last visit.  # Reviewed/counselled regarding the goals of care- being palliative/treatment are usually indefinite-until progression or side effects. Goal is to maintain quality of life as the disease is incurable. Discussed with the patient that we will continue treatment only if her performance status improves and she tolerates Opdivo treatments. If she continues to decline, hospice is recommended. Unfortunately patient has poor social support. She would like to explore life path and what they have to offer.   # follow up in 2 weeks/labs/ opdivo/MD.

## 2016-07-08 NOTE — Progress Notes (Signed)
[facility] CONSULT NOTE  Patient Care Team: Elgie Collard, MD as PCP - General (Obstetrics and Gynecology) Christene Lye, MD (General Surgery)  CHIEF COMPLAINTS/PURPOSE OF CONSULTATION:   Oncology History   # FEB 2017- ADENOCARCINOMA RUL [T2N2; STAGE III Bronch/FNA- Right paratracheal LN;Dr.Ram]. PET- no distant mets; March 3rd START CARBO-TAXOL Weekly +RT [until may 5th]; June 21st 2017- last dose of consolidation carbo-taxol; Jan 12 2016- PET- Improved on RUL mass/LN  # FEB 2017- MRI Brain 16m focus of met [Right frontal]; Jan 12 2016- 3 new brain mets [start WBRT 8/31]; MRI- DEC 2017- New occipital met-~568m RT on HOLD.   # JAN 2018- START OPIDIVO  # left breast abscess; Aug 2017- PET ? Right Breast abcess  # "Chronic pancreatitis"/ smoker [15ppt]   # MOLECULAR STUDIES: ALK-NEG;ROS-1-NEG     [facility]  07/[facility]/2015 Initial Diagnosis    Malignant neoplasm of right upper lobe of lung ([facility])        HISTORY OF PRESENTING ILLNESS:  Tiffany Erickson [age].o.  female  history of stage IV lung cancer adenocarcinoma- Currently on surveillance is here for follow-up.  The patient was recently evaluated for new brain metastases by radiation oncology. Given the small brain metastases and the recent whole brain radiation currently on hold.  Patient feels much better today. She states that [place] she did not even know she was on treatment. She has also been tapering off her prednisone and she is currently taking 1 mg every other day of decadron. Appetite is good. Still does not want to speak with Hospice. She has not fallen at home since last visit and admits to feeling "clumsy" and a bit "foggy" at times. She wishes to continue Opdivo at this time. She also needs to switch from Lyrica to Gabapentin because her new insurance has denied it. Denies any fevers or chills.  No cough or hemoptysis. Denies  abdominal discomfort. Denies nausea at this time. She complains of bilateral hand tremors but they have greatly improved. She denies urinary issues.   ROS: A complete 10 point review of system is done which is negative except mentioned above in history of present illness  MEDICAL HISTORY:  Past Medical History:  Diagnosis Date  . Anxiety   . Broken ribs 05/2015   5 ribs  . [place]  . Concussion 06/01/2015   S/P fall-PT STATES IN PHONE INTERVIEW THAT SHE IS UNSURE IF SHE HAD A CONCUSSION OR NOT  . Frequent UTI    "sometimes" (06/01/2015)  . GERD (gastroesophageal reflux disease)   . [facility]   METASTATIC ADENOCARCINOMA OF THE LUNG  . Lung mass    right side noted 06/01/2015  . Multiple fractures of ribs of right side 06/01/2015   "5" S/P fall  LEFT SIDE  . Shortness of breath dyspnea    5 rib fx right side    SURGICAL HISTORY: Past Surgical History:  Procedure Laterality Date  . ENDOBRONCHIAL ULTRASOUND N/A 07/13/2015   Procedure: ENDOBRONCHIAL ULTRASOUND;  Surgeon: PrLaverle HobbyMD;  Location: [facility] ORS;  Service: Pulmonary;  Laterality: N/A;  . ESOPHAGOGASTRODUODENOSCOPY    . EYE MUSCLE SURGERY Left ~ 1979  . MYRINGOTOMY WITH TUBE PLACEMENT Bilateral   . PORTACATH PLACEMENT Left 07/27/2015   Procedure: INSERTION PORT-A-CATH;  Surgeon: TiNestor LewandowskyMD;  Location: [facility] ORS;  Service: General;  Laterality: Left;  . TONSILLECTOMY  SOCIAL HISTORY: Social History   Social History  . Marital status: Single    Spouse name: N/A  . Number of children: N/A  . Years of education: N/A   Occupational History  . Not on file.   Social History Main Topics  . Smoking status: Current Every Day Smoker    Packs/day: 0.50    Years: 28.00    Types: Cigarettes  . Smokeless tobacco: Never Used  . Alcohol use 8.4 oz/week    2 Glasses of wine, 12 Cans of beer per week     Comment: 06/01/2015 "drink probably 4 times/wk; either ;4 beers or 2  glasses of wine when I do drink"  . Drug use: No  . Sexual activity: Not Currently   Other Topics Concern  . Not on file   Social History Narrative  . No narrative on file    FAMILY HISTORY: Family History  Problem Relation Age of Onset  . Colon cancer Maternal Grandfather   . CAD Maternal Grandfather   . Pancreatic cancer Maternal Grandmother     ALLERGIES:  is allergic to sulfa antibiotics.  MEDICATIONS:  Current Outpatient Prescriptions  Medication Sig Dispense Refill  . albuterol (PROVENTIL HFA;VENTOLIN HFA) 108 (90 Base) MCG/ACT inhaler Inhale 2 puffs into the lungs every 6 (six) hours as needed for wheezing or shortness of breath. 1 Inhaler 2  . ALPRAZolam (XANAX) 0.5 MG tablet Take 1 tablet (0.5 mg total) by mouth 3 (three) times daily as needed for anxiety. 15 tablet 0  . fentaNYL (DURAGESIC - DOSED MCG/HR) 50 MCG/HR Place 1 patch (50 mcg total) onto the skin every 3 (three) days. 10 patch 0  . HYDROcodone-acetaminophen (NORCO/VICODIN) 5-325 MG tablet Take 1 tablet by mouth every 8 (eight) hours as needed for moderate pain. 90 tablet 0  . lidocaine-prilocaine (EMLA) cream Apply 1 application topically as needed. 30 g 6  . metoCLOPramide (REGLAN) 10 MG tablet Take 1 tablet (10 mg total) by mouth every 8 (eight) hours as needed for nausea or vomiting. 90 tablet 5  . pantoprazole (PROTONIX) 20 MG tablet Take 1 tablet (20 mg total) by mouth 2 (two) times daily. 60 tablet 6  . pramipexole (MIRAPEX) 0.5 MG tablet TAKE 1 TABLET BY MOUTH AT BEDTIME  2  . promethazine (PHENERGAN) 25 MG tablet Take 25 mg by mouth every 8 (eight) hours as needed for nausea or vomiting.    . benzonatate (TESSALON) 200 MG capsule Take 1 capsule (200 mg total) by mouth 2 (two) times daily as needed for cough. (Patient not taking: Reported on 07/08/2016) 20 capsule 0  . dexamethasone (DECADRON) 4 MG tablet Take 1 tablet (4 mg total) by mouth 2 (two) times daily. (Patient not taking: Reported on 07/08/2016) 60  tablet 0  . gabapentin (NEURONTIN) 300 MG capsule Take 1 capsule (300 mg total) by mouth 2 (two) times daily. 60 capsule 2  . nystatin (MYCOSTATIN) 100000 UNIT/ML suspension Take 5 mLs (500,000 Units total) by mouth 4 (four) times daily. (Patient not taking: Reported on 07/08/2016) 60 mL 3  . ondansetron (ZOFRAN) 8 MG tablet Take 1 tablet (8 mg total) by mouth every 8 (eight) hours as needed for nausea or vomiting (start 3 days; after chemo). (Patient not taking: Reported on 07/08/2016) 40 tablet 6   No current facility-administered medications for this visit.    Facility-Administered Medications Ordered in Other Visits  Medication Dose Route Frequency Provider Last Rate Last Dose  . sodium chloride flush (NS) 0.9 %  injection 10 mL  10 mL Intravenous PRN Cammie Sickle, MD   10 mL at 03/02/16 1107      .  PHYSICAL EXAMINATION: ECOG PERFORMANCE STATUS: 0 - Asymptomatic  Vitals:   07/08/16 0949  BP: 115/78  Pulse: (!) 112  Temp: 98 F (36.7 C)   Filed Weights   07/08/16 0949  Weight: 138 lb 6 oz (62.8 kg)    GENERAL: Thin build; cachectic-appearing female patient Alert, no distress and comfortable.She Accompanied by her aunt.  Patient is in a wheelchair. EYES: no pallor or icterus OROPHARYNX: No thrush significantly improved. 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 mediport- no signs of infection.   LABORATORY DATA:  I have reviewed the data as listed Lab Results  Component Value Date   WBC 8.6  07/08/2016   HGB 11.9 (L) 07/08/2016   HCT 34.9 (L) 07/08/2016   MCV 95.9 07/08/2016   PLT 325 07/08/2016    Recent Labs  05/19/16 0850  06/16/16 0303 06/24/16 1040 07/08/16 0918  NA 134*  < > 134* 136 134*  K 2.6*  < > 3.4* 3.4* 3.6  CL 95*  < > 100* 103 102  CO2 26  < > 26 23 25   GLUCOSE 146*  < > 122* 93 102*  BUN <5*  < > 7 11 13   CREATININE 0.95  < > 0.43* 0.59 0.43*  CALCIUM 9.5  < > 8.9 9.1 9.0  GFRNONAA >60  < > >60 >60 >60  GFRAA >60  < > >60 >60 >60  PROT 7.2  --   --  6.7 6.5  ALBUMIN 4.1  --   --  3.7 3.6  AST 27  --   --  22 17  ALT 11*  --   --  17 14  ALKPHOS 45  --   --  45 53  BILITOT 0.6  --   --  0.3 0.5  < > = values in this interval not displayed.   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  DEC 2017- Chest CT- Partial response. ; MRI DEC 2017- Brain metastasis- small new brain mets- evaluated by Dr.Crystal. No RT now; MRI Jan 2018 showed increasing size of metswith worsening edema.  #Continue Opdivo every 2 weeks. Labs are adequate for treatment.   # Leukocytosis: Resolved. Should no longer be on steroids after today.   # Oral thrush- Resolved.   # Bil tremors- Much better.  Due to steroids.  # Dehydration/Tachycardia- Continue drinking plenty of fluids.   # Clumsy/Unsetadiness: Radiation/Mets/Chemo. Encouraged slow position changes and the use of a cane or walker. Encouraged PT.    # PN- Better. Continue PRN medications. Switch to Gabapentin. Lyrica was not covered by insurance. New prescription given.   # Dysuria/urgency/frequency/odor: Resolved. UA negative during last visit.  # Reviewed/counselled regarding the goals of care- being palliative/treatment are usually indefinite-until progression or side effects. Goal is to  maintain quality of life as the disease is incurable. Discussed with the patient that we will  continue treatment only if her performance status improves and she tolerates Opdivo treatments. If she continues to decline, hospice is recommended. Unfortunately patient has poor social support. She would like to explore life path and what they have to offer.   # follow up in 2 weeks/labs/ opdivo/MD.  Faythe Casa, NP    Jacquelin Hawking, NP 07/08/2016 4:51 PM

## 2016-07-11 ENCOUNTER — Telehealth: Payer: Self-pay | Admitting: *Deleted

## 2016-07-11 ENCOUNTER — Emergency Department: Payer: Medicaid Other

## 2016-07-11 ENCOUNTER — Encounter: Payer: Self-pay | Admitting: Emergency Medicine

## 2016-07-11 ENCOUNTER — Observation Stay
Admission: EM | Admit: 2016-07-11 | Discharge: 2016-07-15 | Disposition: A | Discharge status: Home / Self Care | Payer: Medicaid Other | Attending: Internal Medicine | Admitting: Internal Medicine

## 2016-07-11 DIAGNOSIS — K861 Other chronic pancreatitis: Secondary | ICD-10-CM | POA: Diagnosis not present

## 2016-07-11 DIAGNOSIS — F419 Anxiety disorder, unspecified: Secondary | ICD-10-CM | POA: Insufficient documentation

## 2016-07-11 DIAGNOSIS — R441 Visual hallucinations: Secondary | ICD-10-CM | POA: Insufficient documentation

## 2016-07-11 DIAGNOSIS — R600 Localized edema: Secondary | ICD-10-CM | POA: Insufficient documentation

## 2016-07-11 DIAGNOSIS — Z9221 Personal history of antineoplastic chemotherapy: Secondary | ICD-10-CM | POA: Diagnosis not present

## 2016-07-11 DIAGNOSIS — Z85118 Personal history of other malignant neoplasm of bronchus and lung: Secondary | ICD-10-CM | POA: Insufficient documentation

## 2016-07-11 DIAGNOSIS — F1010 Alcohol abuse, uncomplicated: Secondary | ICD-10-CM | POA: Diagnosis not present

## 2016-07-11 DIAGNOSIS — C799 Secondary malignant neoplasm of unspecified site: Secondary | ICD-10-CM

## 2016-07-11 DIAGNOSIS — Z882 Allergy status to sulfonamides status: Secondary | ICD-10-CM | POA: Insufficient documentation

## 2016-07-11 DIAGNOSIS — R000 Tachycardia, unspecified: Secondary | ICD-10-CM | POA: Insufficient documentation

## 2016-07-11 DIAGNOSIS — R29898 Other symptoms and signs involving the musculoskeletal system: Secondary | ICD-10-CM | POA: Insufficient documentation

## 2016-07-11 DIAGNOSIS — K219 Gastro-esophageal reflux disease without esophagitis: Secondary | ICD-10-CM | POA: Diagnosis not present

## 2016-07-11 DIAGNOSIS — E876 Hypokalemia: Secondary | ICD-10-CM | POA: Diagnosis not present

## 2016-07-11 DIAGNOSIS — Z66 Do not resuscitate: Secondary | ICD-10-CM | POA: Insufficient documentation

## 2016-07-11 DIAGNOSIS — R262 Difficulty in walking, not elsewhere classified: Secondary | ICD-10-CM

## 2016-07-11 DIAGNOSIS — C3411 Malignant neoplasm of upper lobe, right bronchus or lung: Principal | ICD-10-CM | POA: Insufficient documentation

## 2016-07-11 DIAGNOSIS — C7931 Secondary malignant neoplasm of brain: Secondary | ICD-10-CM | POA: Insufficient documentation

## 2016-07-11 DIAGNOSIS — F17210 Nicotine dependence, cigarettes, uncomplicated: Secondary | ICD-10-CM | POA: Diagnosis not present

## 2016-07-11 DIAGNOSIS — Z515 Encounter for palliative care: Secondary | ICD-10-CM | POA: Diagnosis not present

## 2016-07-11 LAB — URINALYSIS, COMPLETE (UACMP) WITH MICROSCOPIC
Bilirubin Urine: NEGATIVE
GLUCOSE, UA: NEGATIVE mg/dL
Hgb urine dipstick: NEGATIVE
KETONES UR: 20 mg/dL — AB
Nitrite: NEGATIVE
PROTEIN: NEGATIVE mg/dL
Specific Gravity, Urine: 1.014 (ref 1.005–1.030)
pH: 6 (ref 5.0–8.0)

## 2016-07-11 LAB — CBC WITH DIFFERENTIAL/PLATELET
BASOS ABS: 0 10*3/uL (ref 0–0.1)
Basophils Relative: 1 %
Eosinophils Absolute: 0.1 10*3/uL (ref 0–0.7)
Eosinophils Relative: 1 %
HCT: 37.6 % (ref 35.0–47.0)
Hemoglobin: 13.3 g/dL (ref 12.0–16.0)
LYMPHS PCT: 8 %
Lymphs Abs: 0.7 10*3/uL — ABNORMAL LOW (ref 1.0–3.6)
MCH: 34.1 pg — ABNORMAL HIGH (ref 26.0–34.0)
MCHC: 35.4 g/dL (ref 32.0–36.0)
MCV: 96.3 fL (ref 80.0–100.0)
Monocytes Absolute: 0.7 10*3/uL (ref 0.2–0.9)
Monocytes Relative: 8 %
NEUTROS ABS: 6.9 10*3/uL — AB (ref 1.4–6.5)
Neutrophils Relative %: 82 %
PLATELETS: 338 10*3/uL (ref 150–440)
RBC: 3.9 MIL/uL (ref 3.80–5.20)
RDW: 17.6 % — ABNORMAL HIGH (ref 11.5–14.5)
WBC: 8.4 10*3/uL (ref 3.6–11.0)

## 2016-07-11 LAB — COMPREHENSIVE METABOLIC PANEL
ALBUMIN: 4.1 g/dL (ref 3.5–5.0)
ALT: 14 U/L (ref 14–54)
AST: 19 U/L (ref 15–41)
Alkaline Phosphatase: 60 U/L (ref 38–126)
Anion gap: 10 (ref 5–15)
BILIRUBIN TOTAL: 1.1 mg/dL (ref 0.3–1.2)
BUN: 5 mg/dL — AB (ref 6–20)
CHLORIDE: 97 mmol/L — AB (ref 101–111)
CO2: 27 mmol/L (ref 22–32)
CREATININE: 0.56 mg/dL (ref 0.44–1.00)
Calcium: 9.3 mg/dL (ref 8.9–10.3)
GFR calc Af Amer: >60 mL/min (ref >60)
GLUCOSE: 105 mg/dL — AB (ref 65–99)
Potassium: 3.2 mmol/L — ABNORMAL LOW (ref 3.5–5.1)
Sodium: 134 mmol/L — ABNORMAL LOW (ref 135–145)
Total Protein: 7.3 g/dL (ref 6.5–8.1)

## 2016-07-11 MED ORDER — PANTOPRAZOLE SODIUM 40 MG PO TBEC
40.0000 mg | DELAYED_RELEASE_TABLET | Freq: Two times a day (BID) | ORAL | Status: DC
  Administered 2016-07-12 – 2016-07-15 (×6): 40 mg via ORAL
  Filled 2016-07-11 (×7): qty 1

## 2016-07-11 MED ORDER — SODIUM CHLORIDE 0.9 % IV SOLN
INTRAVENOUS | Status: DC
  Administered 2016-07-12: 02:00:00 via INTRAVENOUS

## 2016-07-11 MED ORDER — SODIUM CHLORIDE 0.9 % IV BOLUS (SEPSIS)
1000.0000 mL | Freq: Once | INTRAVENOUS | Status: AC
  Administered 2016-07-11: 1000 mL via INTRAVENOUS

## 2016-07-11 MED ORDER — ACETAMINOPHEN 325 MG PO TABS: 650.0000 mg | ORAL_TABLET | Freq: Four times a day (QID) | ORAL | Status: DC | PRN

## 2016-07-11 MED ORDER — ONDANSETRON HCL 4 MG/2ML IJ SOLN
4.0000 mg | Freq: Once | INTRAMUSCULAR | Status: AC
  Administered 2016-07-11: 4 mg via INTRAVENOUS
  Filled 2016-07-11: qty 2

## 2016-07-11 MED ORDER — DOCUSATE SODIUM 100 MG PO CAPS
100.0000 mg | ORAL_CAPSULE | Freq: Two times a day (BID) | ORAL | Status: DC
  Administered 2016-07-12 – 2016-07-14 (×5): 100 mg via ORAL
  Filled 2016-07-11 (×7): qty 1

## 2016-07-11 MED ORDER — GABAPENTIN 300 MG PO CAPS
300.0000 mg | ORAL_CAPSULE | Freq: Two times a day (BID) | ORAL | Status: DC
  Administered 2016-07-11 – 2016-07-15 (×8): 300 mg via ORAL
  Filled 2016-07-11 (×8): qty 1

## 2016-07-11 MED ORDER — ACETAMINOPHEN 650 MG RE SUPP: 650.0000 mg | Freq: Four times a day (QID) | RECTAL | Status: DC | PRN

## 2016-07-11 MED ORDER — ALPRAZOLAM 0.5 MG PO TABS
0.5000 mg | ORAL_TABLET | Freq: Three times a day (TID) | ORAL | Status: DC | PRN
  Administered 2016-07-11 – 2016-07-15 (×5): 0.5 mg via ORAL
  Filled 2016-07-11 (×5): qty 1

## 2016-07-11 MED ORDER — FENTANYL 50 MCG/HR TD PT72
50.0000 ug | MEDICATED_PATCH | TRANSDERMAL | Status: DC
  Administered 2016-07-12 – 2016-07-14 (×2): 50 ug via TRANSDERMAL
  Filled 2016-07-11 (×2): qty 1

## 2016-07-11 MED ORDER — ENOXAPARIN SODIUM 40 MG/0.4ML ~~LOC~~ SOLN
40.0000 mg | SUBCUTANEOUS | Status: DC
  Administered 2016-07-11 – 2016-07-14 (×4): 40 mg via SUBCUTANEOUS
  Filled 2016-07-11 (×4): qty 0.4

## 2016-07-11 MED ORDER — HYDROCODONE-ACETAMINOPHEN 5-325 MG PO TABS
1.0000 | ORAL_TABLET | Freq: Four times a day (QID) | ORAL | Status: DC | PRN
  Administered 2016-07-11: 21:00:00 1 via ORAL
  Filled 2016-07-11: qty 1

## 2016-07-11 MED ORDER — METOCLOPRAMIDE HCL 10 MG PO TABS
10.0000 mg | ORAL_TABLET | Freq: Three times a day (TID) | ORAL | Status: DC | PRN
  Administered 2016-07-14 – 2016-07-15 (×2): 10 mg via ORAL
  Filled 2016-07-11 (×2): qty 1

## 2016-07-11 MED ORDER — PROMETHAZINE HCL 25 MG PO TABS
25.0000 mg | ORAL_TABLET | Freq: Three times a day (TID) | ORAL | Status: DC | PRN
  Administered 2016-07-11 – 2016-07-14 (×2): 25 mg via ORAL
  Filled 2016-07-11 (×2): qty 1

## 2016-07-11 MED ORDER — PRAMIPEXOLE DIHYDROCHLORIDE 0.25 MG PO TABS
0.5000 mg | ORAL_TABLET | Freq: Every day | ORAL | Status: DC
  Administered 2016-07-11 – 2016-07-14 (×4): 0.5 mg via ORAL
  Filled 2016-07-11 (×4): qty 2

## 2016-07-11 MED ORDER — DEXAMETHASONE SODIUM PHOSPHATE 10 MG/ML IJ SOLN
10.0000 mg | Freq: Once | INTRAMUSCULAR | Status: DC
  Filled 2016-07-11: qty 1

## 2016-07-11 MED ORDER — ALBUTEROL SULFATE (2.5 MG/3ML) 0.083% IN NEBU: 2.5000 mg | INHALATION_SOLUTION | Freq: Four times a day (QID) | RESPIRATORY_TRACT | Status: DC | PRN

## 2016-07-11 NOTE — Telephone Encounter (Signed)
Called to report that she is unable to walk or move her left leg right leg is also affected. Feel a bit numb and hurting a little. Denies swelling, fever, or discoloration. Last BM 2 days ago. Reports pain in low back

## 2016-07-11 NOTE — Telephone Encounter (Signed)
Instructed per VO Dr B for patient to go to ER. Her mother stated she has no way to get her there. I advised her to call EMS. She said she will call EMS

## 2016-07-11 NOTE — ED Provider Notes (Signed)
Time Seen: Approximately 1319  I have reviewed the triage notes  Chief Complaint: Weakness   History of Present Illness: Tiffany Erickson is a 46 y.o. female who has known history of metastatic lung cancer. Patient's on symptom control treatment only at this point. She states she has been contacted by hospice and they help her fill her medications which she is currently not under the hospice program. Her record she does have a DO NOT RESUSCITATE. She was transported here by EMS due to increasing weakness at home and she can't get up and go the bathroom, etc. She states her bowels are still moving that she's had a decreased appetite, increased headache without persistent vomiting. She denies any visual acuity changes. No metastatic disease to the brain and has been on Decadron but is recently been lowered on her daily dosage. She states weakness in both lower extremities left greater than the right. She denies any fever at home.   Past Medical History:  Diagnosis Date  . Anxiety   . Broken ribs 05/2015   5 ribs  . Chronic pancreatitis ([place])   . Concussion 06/01/2015   S/P fall-PT STATES IN PHONE INTERVIEW THAT SHE IS UNSURE IF SHE HAD A CONCUSSION OR NOT  . Frequent UTI    "sometimes" (06/01/2015)  . GERD (gastroesophageal reflux disease)   . Lung cancer ([place]) 07/13/15   METASTATIC ADENOCARCINOMA OF THE LUNG  . Lung mass    right side noted 06/01/2015  . Multiple fractures of ribs of right side 06/01/2015   "5" S/P fall  LEFT SIDE  . Shortness of breath dyspnea    5 rib fx right side    Patient Active Problem List   Diagnosis Date Noted  . GERD (gastroesophageal reflux disease) 06/15/2016  . Delirium 06/12/2016  . Normocytic anemia 06/12/2016  . Counseling regarding goals of care 06/10/2016  . Brain metastasis ([place]) 12/09/2015  . Hypokalemia 11/25/2015  . Encounter for antineoplastic chemotherapy 11/24/2015  . Chronic pancreatitis ([place]) 11/24/2015  . Drug-induced neutropenia  ([place]) 11/24/2015  . Malignant neoplasm of right upper lobe of lung ([place]) 07/30/2015  . Dyspnea   . Fall 06/02/2015  . Multiple rib fractures 06/01/2015    Past Surgical History:  Procedure Laterality Date  . ENDOBRONCHIAL ULTRASOUND N/A 07/13/2015   Procedure: ENDOBRONCHIAL ULTRASOUND;  Surgeon: Laverle Hobby, MD;  Location: [facility] ORS;  Service: Pulmonary;  Laterality: N/A;  . ESOPHAGOGASTRODUODENOSCOPY    . EYE MUSCLE SURGERY Left ~ 1979  . MYRINGOTOMY WITH TUBE PLACEMENT Bilateral   . PORTACATH PLACEMENT Left 07/27/2015   Procedure: INSERTION PORT-A-CATH;  Surgeon: [name], MD;  Location: [facility] ORS;  Service: General;  Laterality: Left;  . TONSILLECTOMY      Past Surgical History:  Procedure Laterality Date  . ENDOBRONCHIAL ULTRASOUND N/A 07/13/2015   Procedure: ENDOBRONCHIAL ULTRASOUND;  Surgeon: Laverle Hobby, MD;  Location: [facility] ORS;  Service: Pulmonary;  Laterality: N/A;  . ESOPHAGOGASTRODUODENOSCOPY    . EYE MUSCLE SURGERY Left ~ 1979  . MYRINGOTOMY WITH TUBE PLACEMENT Bilateral   . PORTACATH PLACEMENT Left 07/27/2015   Procedure: INSERTION PORT-A-CATH;  Surgeon: [name], MD;  Location: [facility] ORS;  Service: General;  Laterality: Left;  . TONSILLECTOMY      Current Outpatient Rx  . Order #: [id number] Class: Normal  . Order #: [id number] Class: Print  . Order #: [id number] Class: No Print  . Order #: [id number] Class: Normal  . Order #: [id number] Class: Print  . Order #: [id number] Class: Normal  . Order #:  [id number] Class: Print  . Order #: [id number] Class: Normal  . Order #: [id number] Class: Print  . Order #: [id number] Class: Normal  . Order #: [id number] Class: Normal  . Order #: [id number] Class: Normal  . Order #: [id number] Class: Historical Med  . Order #: [id number] Class: Historical Med    Allergies:  Sulfa antibiotics  Family History: Family History  Problem Relation Age of Onset  . Colon cancer Maternal Grandfather   . CAD Maternal Grandfather   .  Pancreatic cancer Maternal Grandmother     Social History: Social History  Substance Use Topics  . Smoking status: Current Every Day Smoker    Packs/day: 0.50    Years: 28.00    Types: Cigarettes  . Smokeless tobacco: Never Used  . Alcohol use 8.4 oz/week    2 Glasses of wine, 12 Cans of beer per week     Comment: 06/01/2015 "drink probably 4 times/wk; either ;4 beers or 2 glasses of wine when I do drink"     Review of Systems:   10 point review of systems was performed and was otherwise negative:  Constitutional: No fever Eyes: No visual disturbances ENT: No sore throat, ear pain Cardiac: No chest pain Respiratory: No shortness of breath, wheezing, or stridor Abdomen: No abdominal pain, no vomiting, No diarrhea Endocrine: No weight loss, No night sweats Extremities: No peripheral edema, cyanosis Skin: No rashes, easy bruising Neurologic: No focal weakness, trouble with speech or swollowing Urologic: No dysuria, Hematuria, or urinary frequency   Physical Exam:  ED Triage Vitals  Enc Vitals Group     BP      Pulse      Resp      Temp      Temp src      SpO2      Weight      Height      Head Circumference      Peak Flow      Pain Score      Pain Loc      Pain Edu?      Excl. in [place]?     General: Awake , Alert , and Oriented times 3; GCS 15 Head: Normal cephalic , atraumatic Eyes: Pupils equal , round, reactive to light. Extraocular eye movements are intact clear conjunctiva Nose/Throat: No nasal drainage, patent upper airway without erythema or exudate.  Neck: Supple, Full range of motion, No anterior adenopathy or palpable thyroid masses Lungs: Clear to ascultation without wheezes , rhonchi, or rales Heart: Regular rate, regular rhythm without murmurs , gallops , or rubs Abdomen: Soft, non tender without rebound, guarding , or rigidity; bowel sounds positive and symmetric in all 4 quadrants. No organomegaly .        Extremities: 2 plus symmetric pulses. No  edema, clubbing or cyanosis Neurologic: Weakness in all extremities. The patient cannot lift her left leg up off the stretcher. She does have sensory intact. The right lower extremities she is able to initiate lifting her leg up off the stretcher but not against any form of pressure. She can squeeze bilaterally in the upper extremities seem to be symmetric with 3+ of 5 strength.  Skin: warm, dry, no rashes   Labs:   All laboratory work was reviewed including any pertinent negatives or positives listed below:  Labs Reviewed  CBC WITH DIFFERENTIAL/PLATELET  COMPREHENSIVE METABOLIC PANEL   Review of the urinalysis shows possible urinary tract infection  Radiology: * "Dg Lumbar Spine Complete  Result Date: 06/11/2016 CLINICAL DATA:  46 year old  with current history of metastatic lung cancer who fell down stairs at home earlier this evening and complains of left hip pain. Initial encounter. EXAM: LUMBAR SPINE - COMPLETE 4+ VIEW COMPARISON:  Bone window images from CT abdomen and pelvis 06/01/2015. FINDINGS: Five non-rib-bearing lumbar vertebrae with anatomic alignment. Slight thoracolumbar dextroscoliosis. No fractures. Mild disc space narrowing and endplate hypertrophic changes at L3-4. Remaining disc spaces well-preserved. No pars defects. No significant facet arthropathy. Mild abdominal aortic atherosclerosis without evidence of aneurysm. IMPRESSION: 1. No acute osseous abnormality. 2. Mild degenerative disc disease and spondylosis at L3-4. 3. Mild thoracolumbar dextroscoliosis. 4. Mild abdominal aortic atherosclerosis without evidence of aneurysm. Electronically Signed   By: Evangeline Dakin M.D.   On: 06/11/2016 23:51   Ct Head Wo Contrast  Result Date: 07/11/2016 CLINICAL DATA:  Generalized weakness, worse on the left. History of metastatic lung cancer. EXAM: CT HEAD WITHOUT CONTRAST TECHNIQUE: Contiguous axial images were obtained from the base of the skull through the vertex without intravenous  contrast. COMPARISON:  Head CT 06/12/2016. FINDINGS: Brain: Right parafalcine lesion is again seen measuring 0.7 cm compared to 1.8 cm on the prior exam. Right parafalcine lesion today measures 1.0 cm compared to 1.4 cm on the prior exams. Right occipital lesion is also again noted Vasogenic edema about both lesions is increased. No hemorrhage, infarct, midline shift or abnormal extra axial collection is seen. Vascular: Negative. Skull: Intact. Sinuses/Orbits: Bilateral mastoid effusions and mucosal thickening left sphenoid sinus are again seen. Other: None. IMPRESSION: No acute abnormality. Improved appearance of intracranial lesions with increased surrounding edema consistent with post treatment change. Electronically Signed   By: Inge Rise M.D.   On: 07/11/2016 14:14   Ct Head Wo Contrast  Result Date: 06/12/2016 CLINICAL DATA:  46 y/o F; history of metastatic lung cancer with confusion. EXAM: CT HEAD WITHOUT CONTRAST TECHNIQUE: Contiguous axial images were obtained from the base of the skull through the vertex without intravenous contrast. COMPARISON:  05/17/2016 MRI of the head FINDINGS: Brain: Metastasis within the right occipital lobe, right parafalcine frontal lobe, left parafalcine parietal lobe are increased in size in comparison with the prior MRI given differences in technique for example the right parafalcine frontal lesion measures 18 mm, previously 10 mm measured in a similar fashion (series 2, image 22). Additionally the areas of edema surrounding the metastasis has increased. There is no evidence for large acute infarct, herniation, or brain parenchymal hemorrhage. Vascular: No hyperdense vessel or unexpected calcification. Skull: Normal. Negative for fracture or focal lesion. Sinuses/Orbits: Mucous retention cyst within the sphenoid sinus. Opacification of the bilateral mastoid air cells similar prior MRI. Orbits are unremarkable. Other: None. IMPRESSION: In comparison with the prior MRI  of the brain metastasis in the right frontal lobe, left parietal lobe, and right occipital lobe are increased in size as well as associated edema. Suboptimal evaluation for new metastasis in the absence of intravenous contrast. No evidence for large acute infarct or intracranial hemorrhage. Electronically Signed   By: Kristine Garbe M.D.   On: 06/12/2016 04:30   Ct Angio Chest Pe W And/or Wo Contrast  Result Date: 06/12/2016 CLINICAL DATA:  Confusion and tachycardia EXAM: CT ANGIOGRAPHY CHEST WITH CONTRAST TECHNIQUE: Multidetector CT imaging of the chest was performed using the standard protocol during bolus administration of intravenous contrast. Multiplanar CT image reconstructions and MIPs were obtained to evaluate the vascular anatomy. CONTRAST:  100 mL Isovue 370 intravenous COMPARISON:  CT chest 05/17/2016 FINDINGS: Cardiovascular: Satisfactory opacification of the pulmonary  arteries to the segmental level. No evidence of pulmonary embolism. Normal heart size. Small pericardial effusion. Non aneurysmal aorta. No dissection. Mediastinum/Nodes: Stable 9 mm right paratracheal lymph node. Small lymph nodes adjacent to the aortic arch are unchanged. No axillary adenopathy. Trachea and normal. Thyroid normal. Esophagus unremarkable. Lungs/Pleura: Development of small right-sided pleural effusion. Increased consolidation within the apical portion of right upper lobe at the site of prior tumor. Air bronchograms and dilated bronchi are again visualized. Right paramediastinal presumed post radiation fibrotic changes are again identified. Interim finding of mild ground-glass nodular densities in the right lower lobe. Additional scattered foci of ground-glass density in the left upper lobe as well. Upper Abdomen: No acute abnormality in the upper abdomen. Musculoskeletal: No suspicious bone lesion. Review of the MIP images confirms the above findings. IMPRESSION: 1. No CT evidence for acute pulmonary embolus.  2. New small right-sided pleural effusion. Interval increase in consolidation within the apical portion of right upper lobe in the region of previously noted tumor ; it is uncertain if this represents increase in size of pre-existing mass or superimposed infection/pneumonia. 3. 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 Increased ground-glass density in the right lower lobe with scattered foci of ground-glass in the left upper lobe suggests possible infectious or inflammatory process. 4. Stable right peritracheal 9 mm lymph node. Electronically Signed   By: Donavan Foil M.D.   On: 06/12/2016 03:35   Mr Lumbar Spine Wo Contrast  Result Date: 07/11/2016 CLINICAL DATA:  Generalized weakness, worse on the left side, to the point where she is unable to walk. Pt reports she woke up Saturday morning with the weakness, last time known well was Friday. EXAM: MRI LUMBAR SPINE WITHOUT CONTRAST TECHNIQUE: Multiplanar, multisequence MR imaging of the lumbar spine was performed. No intravenous contrast was administered. COMPARISON:  None. FINDINGS: Segmentation:  Standard. Alignment:  Physiologic. Vertebrae:  No fracture, evidence of discitis, or bone lesion. Conus medullaris: Extends to the L1 level and appears normal. Paraspinal and other soft tissues: Negative. Disc levels: Disc spaces: Disc heights are maintained. T12-L1: No significant disc bulge. No evidence of neural foraminal stenosis. No central canal stenosis. L1-L2: No significant disc bulge. No evidence of neural foraminal stenosis. No central canal stenosis. L2-L3: No significant disc bulge. No evidence of neural foraminal stenosis. No central canal stenosis. Mild bilateral facet arthropathy. L3-L4: Mild broad-based disc bulge. Mild bilateral facet arthropathy. No evidence of neural foraminal stenosis. No central canal stenosis. L4-L5: No significant disc bulge. No evidence of neural  foraminal stenosis. No central canal stenosis. L5-S1: No significant disc bulge. No evidence of neural foraminal stenosis. No central canal stenosis. Mild left facet arthropathy. IMPRESSION: 1. No acute injury of the lumbar spine. 2. No significant disc protrusion, foraminal stenosis or central canal stenosis of the lumbar spine. Electronically Signed   By: Kathreen Devoid   On: 07/11/2016 16:44   Dg Hip Unilat W Or Wo Pelvis 2-3 Views Left  Result Date: 06/11/2016 CLINICAL DATA:  46 year old with current history of metastatic right upper lobe lung cancer who fell down stairs at home earlier this evening and complains of left hip pain. Initial encounter. EXAM: DG HIP (WITH OR WITHOUT PELVIS) 2-3V LEFT COMPARISON:  Bone window images from CT pelvis 06/01/2015. FINDINGS: No evidence of acute fracture or dislocation. Well-preserved joint space. Well-preserved bone mineral density. Included AP pelvis demonstrates a symmetric normal-appearing contralateral right hip. Sacroiliac joints and symphysis pubis intact. No evidence  of osseous metastatic disease. IMPRESSION: Normal examination. Electronically Signed   By: Evangeline Dakin M.D.   On: 06/11/2016 23:49  "  I personally reviewed the radiologic studies   ED Course:  Patient presents with significant history of metastatic lung cancer especially to the brain and presents with increasing left lower extremity weakness and inability to take care of herself at home. Patient's had some phone contact with hospice care but currently is not in the hospice program. Patient has had difficulty with ambulation etc. I felt was most likely an increase in her known intracranial masses, however CAT scan shows improvement. We then added on a lumbar MRI to evaluate for metastatic disease in the lumbar spine to see if this was the cause of her left lower extremity weakness and there is no evident cause at this time. The patient was offered a bolus of steroids therapy and she wishes  to hold off because it makes her "" jittery "". It is clear that the patient was unable to go home at this pointto the hospitalist team for further inpatient management and to home but arrangements can be made. Acquired neurology consultation, etc.     Assessment:  Left lower extremity weakness Metastatic lung cancer      Plan:  Inpatient            Daymon Larsen, MD 07/11/16 [phone]

## 2016-07-11 NOTE — ED Notes (Signed)
Pt alert.  Family at bedside.  Pt waiitng on admission.  Iv fluids infusing.

## 2016-07-11 NOTE — ED Triage Notes (Signed)
Pt in via EMS from home with complaints of generalized weakness, worse on the left side, to the point where she is unable to walk.  Pt reports she woke up Saturday morning with the weakness, last time known well was Friday at time of going to bed, around 2200.  Pt is a cancer pt; lung cancer with mets to brain, pt taking Optivo treatments rather than Chemo.  Pt also reports some N/V, denies any diarrhea.  Pt last treatment was Friday.  Pt A/Ox4, tachycardic upon arrival, other vitals WDL.  NAD noted at this time.

## 2016-07-11 NOTE — Progress Notes (Signed)
Family Meeting Note  Advance Directive:yes  Today a meeting took place with the Patient and MD   The diagnosis and CT head results were discussed with the patient. Goals of care discussed. Patient is not considering palliative care or hospice care at this time. Wants to be full code. AUNT Coralyn Mark is  the healthcare power of attorney   Additional follow-up to be provided: Hospitalist and oncology  Time spent during discussion:20 minutes  Tiffany Erickson, Tiffany Silver, MD

## 2016-07-11 NOTE — ED Notes (Signed)
Pt eating dinner tray °

## 2016-07-11 NOTE — H&P (Signed)
[name] NAME: Tiffany Erickson    MR#:  [id number]  DATE OF BIRTH:  03-26-1971  DATE OF ADMISSION:  07/11/2016  PRIMARY CARE PHYSICIAN: Otilio Miu, MD   REQUESTING/REFERRING PHYSICIAN: Nile Riggs, MD  CHIEF COMPLAINT:   Bilateral lower extending to weakness  HISTORY OF PRESENT ILLNESS:  Tiffany Erickson  is a 46 y.o. female with a known history of Metastatic lung cancer with brain metastases is presenting to the emergency department with a chief complaint of bilateral lower extremity weakness left greater than right. Patient is a seen by oncologist Dr. Rogue Bussing and getting infusions every 2 weeks. Patient is coming with the bilateral leg weakness left greater than right. CT head is negative. MRI of the lumbar spine is ordered which is pending at this time. Patient is a have urinating and having bowel movements  PAST MEDICAL HISTORY:   Past Medical History:  Diagnosis Date  . Anxiety   . Broken ribs 05/2015   5 ribs  . Chronic pancreatitis ([place])   . Concussion 06/01/2015   S/P fall-PT STATES IN PHONE INTERVIEW THAT SHE IS UNSURE IF SHE HAD A CONCUSSION OR NOT  . Frequent UTI    "sometimes" (06/01/2015)  . GERD (gastroesophageal reflux disease)   . Lung cancer ([place]) 07/13/15   METASTATIC ADENOCARCINOMA OF THE LUNG  . Lung mass    right side noted 06/01/2015  . Multiple fractures of ribs of right side 06/01/2015   "5" S/P fall  LEFT SIDE  . Shortness of breath dyspnea    5 rib fx right side    PAST SURGICAL HISTOIRY:   Past Surgical History:  Procedure Laterality Date  . ENDOBRONCHIAL ULTRASOUND N/A 07/13/2015   Procedure: ENDOBRONCHIAL ULTRASOUND;  Surgeon: Laverle Hobby, MD;  Location: [facility] ORS;  Service: Pulmonary;  Laterality: N/A;  . ESOPHAGOGASTRODUODENOSCOPY    . EYE MUSCLE SURGERY Left ~ 1979  . MYRINGOTOMY WITH TUBE PLACEMENT Bilateral   . PORTACATH PLACEMENT Left 07/27/2015   Procedure: INSERTION PORT-A-CATH;   Surgeon: [name], MD;  Location: [facility] ORS;  Service: General;  Laterality: Left;  . TONSILLECTOMY      SOCIAL HISTORY:   Social History  Substance Use Topics  . Smoking status: Current Every Day Smoker    Packs/day: 0.50    Years: 28.00    Types: Cigarettes  . Smokeless tobacco: Never Used  . Alcohol use 8.4 oz/week    2 Glasses of wine, 12 Cans of beer per week     Comment: 06/01/2015 "drink probably 4 times/wk; either ;4 beers or 2 glasses of wine when I do drink"    FAMILY HISTORY:   Family History  Problem Relation Age of Onset  . Colon cancer Maternal Grandfather   . CAD Maternal Grandfather   . Pancreatic cancer Maternal Grandmother     DRUG ALLERGIES:   Allergies  Allergen Reactions  . Sulfa Antibiotics Other (See Comments)    Unknown-last had as a child    REVIEW OF SYSTEMS:  CONSTITUTIONAL: No fever, fatigue or weakness.  EYES: No blurred or double vision.  EARS, NOSE, AND THROAT: No tinnitus or ear pain.  RESPIRATORY: No cough, shortness of breath, wheezing or hemoptysis.  CARDIOVASCULAR: No chest pain, orthopnea, edema.  GASTROINTESTINAL: No nausea, vomiting, diarrhea or abdominal pain.  GENITOURINARY: No dysuria, hematuria.  ENDOCRINE: No polyuria, nocturia,  HEMATOLOGY: No anemia, easy bruising or bleeding SKIN: No rash or lesion. MUSCULOSKELETAL:Reporting left lower extremity and  right lower extremity weakness left greater than right No joint pain or arthritis.   NEUROLOGIC: No tingling, numbness, weakness.  PSYCHIATRY: No anxiety or depression.   MEDICATIONS AT HOME:   Prior to Admission medications   Medication Sig Start Date End Date Taking? Authorizing Provider  HYDROcodone-acetaminophen (NORCO/VICODIN) 5-325 MG tablet Take 1 tablet by mouth every 8 (eight) hours as needed for moderate pain. 05/19/16  Yes Cammie Sickle, MD  promethazine (PHENERGAN) 25 MG tablet Take 25 mg by mouth every 8 (eight) hours as needed for nausea or  vomiting.   Yes Historical Provider, MD  albuterol (PROVENTIL HFA;VENTOLIN HFA) 108 (90 Base) MCG/ACT inhaler Inhale 2 puffs into the lungs every 6 (six) hours as needed for wheezing or shortness of breath. 03/24/16   Cammie Sickle, MD  ALPRAZolam Duanne Moron) 0.5 MG tablet Take 1 tablet (0.5 mg total) by mouth 3 (three) times daily as needed for anxiety. 06/16/16   Patrecia Pour, MD  benzonatate (TESSALON) 200 MG capsule Take 1 capsule (200 mg total) by mouth 2 (two) times daily as needed for cough. Patient not taking: Reported on 07/08/2016 07/07/16   Juline Patch, MD  dexamethasone (DECADRON) 4 MG tablet Take 1 tablet (4 mg total) by mouth 2 (two) times daily. Patient not taking: Reported on 07/08/2016 06/16/16   Patrecia Pour, MD  fentaNYL (DURAGESIC - DOSED MCG/HR) 50 MCG/HR Place 1 patch (50 mcg total) onto the skin every 3 (three) days. 06/07/16   Noreene Filbert, MD  gabapentin (NEURONTIN) 300 MG capsule Take 1 capsule (300 mg total) by mouth 2 (two) times daily. 07/08/16   Jacquelin Hawking, NP  lidocaine-prilocaine (EMLA) cream Apply 1 application topically as needed. 12/09/15   Cammie Sickle, MD  metoCLOPramide (REGLAN) 10 MG tablet Take 1 tablet (10 mg total) by mouth every 8 (eight) hours as needed for nausea or vomiting. 07/07/16   Juline Patch, MD  nystatin (MYCOSTATIN) 100000 UNIT/ML suspension Take 5 mLs (500,000 Units total) by mouth 4 (four) times daily. Patient not taking: Reported on 07/08/2016 06/10/16   Cammie Sickle, MD  ondansetron (ZOFRAN) 8 MG tablet Take 1 tablet (8 mg total) by mouth every 8 (eight) hours as needed for nausea or vomiting (start 3 days; after chemo). Patient not taking: Reported on 07/08/2016 06/10/16   Cammie Sickle, MD  pantoprazole (PROTONIX) 20 MG tablet Take 1 tablet (20 mg total) by mouth 2 (two) times daily. 07/07/16   Juline Patch, MD  pramipexole (MIRAPEX) 0.5 MG tablet TAKE 1 TABLET BY MOUTH AT BEDTIME 05/23/16   Historical Provider, MD       VITAL SIGNS:  Blood pressure 123/67, pulse (!) 134, temperature 99 F (37.2 C), temperature source Oral, resp. rate (!) 30, height '5\' 2"'$  (1.575 m), weight 61.7 kg (136 lb), SpO2 94 %.  PHYSICAL EXAMINATION:  GENERAL:  46 y.o.-year-old patient lying in the bed with no acute distress.  EYES: Pupils equal, round, reactive to light and accommodation. No scleral icterus. Extraocular muscles intact.  HEENT: Head atraumatic, normocephalic. Oropharynx and nasopharynx clear.  NECK:  Supple, no jugular venous distention. No thyroid enlargement, no tenderness.  LUNGS: Normal breath sounds bilaterally, no wheezing, rales,rhonchi or crepitation. No use of accessory muscles of respiration.  CARDIOVASCULAR: S1, S2 normal. No murmurs, rubs, or gallops.  ABDOMEN: Soft, nontender, nondistended. Bowel sounds present. No organomegaly or mass.  EXTREMITIES: No pedal edema, cyanosis, or clubbing.  NEUROLOGIC: Cranial nerves II through XII  are intact. Muscle strength left lower extremity 1-2 out of 5 and right lower extremity 2 out of 5 motor sensory intact on all extremities 5/5 in all extremities. Sensation intact. Gait not checked.  PSYCHIATRIC: The patient is alert and oriented x 3.  SKIN: No obvious rash, lesion, or ulcer.   LABORATORY PANEL:   CBC  Recent Labs Lab 07/11/16 1324  WBC 8.4  HGB 13.3  HCT 37.6  PLT 338   ------------------------------------------------------------------------------------------------------------------  Chemistries   Recent Labs Lab 07/11/16 1324  NA 134*  K 3.2*  CL 97*  CO2 27  GLUCOSE 105*  BUN 5*  CREATININE 0.56  CALCIUM 9.3  AST 19  ALT 14  ALKPHOS 60  BILITOT 1.1   ------------------------------------------------------------------------------------------------------------------  Cardiac Enzymes No results for input(s): TROPONINI in the last 168  hours. ------------------------------------------------------------------------------------------------------------------  RADIOLOGY:  Ct Head Wo Contrast  Result Date: 07/11/2016 CLINICAL DATA:  Generalized weakness, worse on the left. History of metastatic lung cancer. EXAM: CT HEAD WITHOUT CONTRAST TECHNIQUE: Contiguous axial images were obtained from the base of the skull through the vertex without intravenous contrast. COMPARISON:  Head CT 06/12/2016. FINDINGS: Brain: Right parafalcine lesion is again seen measuring 0.7 cm compared to 1.8 cm on the prior exam. Right parafalcine lesion today measures 1.0 cm compared to 1.4 cm on the prior exams. Right occipital lesion is also again noted Vasogenic edema about both lesions is increased. No hemorrhage, infarct, midline shift or abnormal extra axial collection is seen. Vascular: Negative. Skull: Intact. Sinuses/Orbits: Bilateral mastoid effusions and mucosal thickening left sphenoid sinus are again seen. Other: None. IMPRESSION: No acute abnormality. Improved appearance of intracranial lesions with increased surrounding edema consistent with post treatment change. Electronically Signed   By: Inge Rise M.D.   On: 07/11/2016 14:14    EKG:   Orders placed or performed during the hospital encounter of 06/11/16  . EKG    IMPRESSION AND PLAN:   Deretha Vendetti  is a 46 y.o. female with a known history of Metastatic lung cancer with brain metastases is presenting to the emergency department with a chief complaint of bilateral lower extremity weakness left greater than right. Patient is a seen by oncologist Dr. Rogue Bussing and getting infusions every 2 weeks. Patient is coming with the bilateral leg weakness left greater than right. CT head is negative  #Bilateral lower extremity weakness left greater than right CT head is negative MRI of the lumbar spine is ordered which is pending Orthopedics consult is placed to Dr. Rudene Christians  #History of lung cancer  with brain metastases Getting chemotherapy every 2 weeks Oncology consult is placed to Dr. Rogue Bussing  #Sinus tachycardia could be from pain Pain management will be continued with the gabapentin and fentanyl patches which are her home medications Norco as needed IV fluid bolus Patient doesn't seem to be septic  #Hypokalemia Replete potassium and recheck  #Chronic pancreatitis Currently patient is denying any symptoms of abdominal pain. Monitor closely. Pain management as needed  Provide GI and DVT prophylaxis   All the records are reviewed and case discussed with ED provider. Management plans discussed with the patient, family and they are in agreement.  CODE STATUS: fc,aunt Coralyn Mark is HCPOA  TOTAL TIME TAKING CARE OF THIS PATIENT: 45  minutes.   Note: This dictation was prepared with Dragon dictation along with smaller phrase technology. Any transcriptional errors that result from this process are unintentional.  Nicholes Mango M.D on 07/11/2016 at 4:18 PM  Between 7am to 6pm -  Pager - [phone]  After 6pm go to [email or web address] - password [email or web address] [facility]  [facility]  Office  [phone]  CC: Primary care physician; Otilio Miu, MD

## 2016-07-11 NOTE — ED Notes (Signed)
Resumed care from [facility].  Pt alert.  Sinus tach on monitor at 136.  Skin warm and dry.  Iv in place.  siderails up x 2.  Pt watiing on mri.

## 2016-07-11 NOTE — ED Notes (Signed)
Report called to Alethia Berthold floor nurse

## 2016-07-11 NOTE — Progress Notes (Signed)
Anticoagulation monitoring(Lovenox):  47 yo female ordered Lovenox 30 mg Q24h  Filed Weights   07/11/16 1324 07/11/16 1831  Weight: 136 lb (61.7 kg) 143 lb 9.6 oz (65.1 kg)   BMI 24.9   Lab Results  Component Value Date   CREATININE 0.56 07/11/2016   CREATININE 0.43 (L) 07/08/2016   CREATININE 0.59 06/24/2016   Estimated Creatinine Clearance: 79.5 mL/min (by C-G formula based on SCr of 0.56 mg/dL). Hemoglobin & Hematocrit     Component Value Date/Time   HGB 13.3 07/11/2016 1324   HGB 17.1 (H) 09/25/2014 2254   HCT 37.6 07/11/2016 1324   HCT 50.6 (H) 09/25/2014 2254     Per Protocol for Patient with estCrcl > 30 ml/min and BMI < 40, will transition to Lovenox 40 mg Q24h.

## 2016-07-11 NOTE — ED Notes (Signed)
Pt in mri.  Family in room.

## 2016-07-12 ENCOUNTER — Observation Stay: Payer: Medicaid Other

## 2016-07-12 LAB — COMPREHENSIVE METABOLIC PANEL
ALT: 11 U/L — ABNORMAL LOW (ref 14–54)
AST: 16 U/L (ref 15–41)
Albumin: 3.4 g/dL — ABNORMAL LOW (ref 3.5–5.0)
Alkaline Phosphatase: 52 U/L (ref 38–126)
Anion gap: 10 (ref 5–15)
BILIRUBIN TOTAL: 1.2 mg/dL (ref 0.3–1.2)
BUN: 5 mg/dL — ABNORMAL LOW (ref 6–20)
CHLORIDE: 100 mmol/L — AB (ref 101–111)
CO2: 24 mmol/L (ref 22–32)
CREATININE: 0.59 mg/dL (ref 0.44–1.00)
Calcium: 8.9 mg/dL (ref 8.9–10.3)
Glucose, Bld: 95 mg/dL (ref 65–99)
POTASSIUM: 3.3 mmol/L — AB (ref 3.5–5.1)
Sodium: 134 mmol/L — ABNORMAL LOW (ref 135–145)
TOTAL PROTEIN: 6.4 g/dL — AB (ref 6.5–8.1)

## 2016-07-12 LAB — CBC
HEMATOCRIT: 34.4 % — AB (ref 35.0–47.0)
Hemoglobin: 11.8 g/dL — ABNORMAL LOW (ref 12.0–16.0)
MCH: 33 pg (ref 26.0–34.0)
MCHC: 34.3 g/dL (ref 32.0–36.0)
MCV: 96 fL (ref 80.0–100.0)
PLATELETS: 308 10*3/uL (ref 150–440)
RBC: 3.58 MIL/uL — AB (ref 3.80–5.20)
RDW: 17.5 % — AB (ref 11.5–14.5)
WBC: 9.5 10*3/uL (ref 3.6–11.0)

## 2016-07-12 LAB — PHOSPHORUS: PHOSPHORUS: 3.8 mg/dL (ref 2.5–4.6)

## 2016-07-12 LAB — MAGNESIUM: MAGNESIUM: 1.4 mg/dL — AB (ref 1.7–2.4)

## 2016-07-12 LAB — TSH: TSH: 0.524 u[IU]/mL (ref 0.350–4.500)

## 2016-07-12 LAB — VITAMIN B12: Vitamin B-12: 129 pg/mL — ABNORMAL LOW (ref 180–914)

## 2016-07-12 MED ORDER — FOLIC ACID 1 MG PO TABS
1.0000 mg | ORAL_TABLET | Freq: Every day | ORAL | Status: DC
  Administered 2016-07-12 – 2016-07-14 (×3): 1 mg via ORAL
  Filled 2016-07-12 (×4): qty 1

## 2016-07-12 MED ORDER — POTASSIUM CHLORIDE CRYS ER 10 MEQ PO TBCR
20.0000 meq | EXTENDED_RELEASE_TABLET | Freq: Once | ORAL | Status: AC
  Administered 2016-07-12: 20 meq via ORAL
  Filled 2016-07-12: qty 2

## 2016-07-12 MED ORDER — LORAZEPAM 2 MG/ML IJ SOLN: 1.0000 mg | Freq: Four times a day (QID) | INTRAMUSCULAR | Status: DC | PRN

## 2016-07-12 MED ORDER — DEXAMETHASONE SODIUM PHOSPHATE 10 MG/ML IJ SOLN
4.0000 mg | Freq: Four times a day (QID) | INTRAMUSCULAR | Status: DC
  Administered 2016-07-12 – 2016-07-15 (×13): 4 mg via INTRAVENOUS
  Filled 2016-07-12: qty 0.4
  Filled 2016-07-12 (×7): qty 1
  Filled 2016-07-12: qty 0.4
  Filled 2016-07-12 (×4): qty 1

## 2016-07-12 MED ORDER — LORAZEPAM 2 MG PO TABS
0.0000 mg | ORAL_TABLET | Freq: Four times a day (QID) | ORAL | Status: DC
  Administered 2016-07-12: 1 mg via ORAL
  Administered 2016-07-12 – 2016-07-13 (×2): 2 mg via ORAL
  Filled 2016-07-12 (×3): qty 1

## 2016-07-12 MED ORDER — GADOBENATE DIMEGLUMINE 529 MG/ML IV SOLN
10.0000 mL | Freq: Once | INTRAVENOUS | Status: AC | PRN
  Administered 2016-07-12: 09:00:00 10 mL via INTRAVENOUS

## 2016-07-12 MED ORDER — MAGNESIUM SULFATE 2 GM/50ML IV SOLN
2.0000 g | Freq: Once | INTRAVENOUS | Status: AC
  Administered 2016-07-12: 2 g via INTRAVENOUS
  Filled 2016-07-12: qty 50

## 2016-07-12 MED ORDER — VITAMIN B-1 100 MG PO TABS
100.0000 mg | ORAL_TABLET | Freq: Every day | ORAL | Status: DC
  Administered 2016-07-12 – 2016-07-14 (×3): 100 mg via ORAL
  Filled 2016-07-12 (×5): qty 1

## 2016-07-12 MED ORDER — ASPIRIN 325 MG PO TABS: 325.0000 mg | ORAL_TABLET | Freq: Every day | ORAL | Status: DC

## 2016-07-12 MED ORDER — ADULT MULTIVITAMIN W/MINERALS CH
1.0000 | ORAL_TABLET | Freq: Every day | ORAL | Status: DC
  Administered 2016-07-12 – 2016-07-14 (×3): 1 via ORAL
  Filled 2016-07-12 (×4): qty 1

## 2016-07-12 MED ORDER — THIAMINE HCL 100 MG/ML IJ SOLN: 100.0000 mg | Freq: Every day | INTRAMUSCULAR | Status: DC

## 2016-07-12 MED ORDER — ASPIRIN 325 MG PO TABS
325.0000 mg | ORAL_TABLET | Freq: Every day | ORAL | Status: DC
  Administered 2016-07-12 – 2016-07-14 (×3): 325 mg via ORAL
  Filled 2016-07-12 (×4): qty 1

## 2016-07-12 MED ORDER — LORAZEPAM 1 MG PO TABS: 1.0000 mg | ORAL_TABLET | Freq: Four times a day (QID) | ORAL | Status: DC | PRN

## 2016-07-12 MED ORDER — LORAZEPAM 2 MG PO TABS: 0.0000 mg | ORAL_TABLET | Freq: Two times a day (BID) | ORAL | Status: DC

## 2016-07-12 NOTE — Consult Note (Signed)
Hematology/Oncology Consult note [facility] [phone] [phone]  Patient Care Team: Juline Patch, MD as PCP - General (Family Medicine) Christene Lye, MD (General Surgery)   Name of the patient: Tiffany Erickson  [id number]  1970/09/11    Reason for consultation- known diagnosis of Stage IV NSCLC   Physician requesting consult- Dr. 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 Her oncology history is as follows:  Oncology History   # FEB 2017- ADENOCARCINOMA RUL [T2N2; STAGE III Bronch/FNA- Right paratracheal LN;Dr.Ram]. PET- no distant mets; March 3rd START CARBO-TAXOL Weekly +RT [until may 5th]; June 21st 2017- last dose of consolidation carbo-taxol; Jan 12 2016- PET- Improved on RUL mass/LN  # FEB 2017- MRI Brain 57m focus of met [Right frontal]; Jan 12 2016- 3 new brain mets [start WBRT 8/31]; MRI- DEC 2017- New occipital met-~582m RT on HOLD.   # JAN 2018- START OPIDIVO  # left breast abscess; Aug 2017- PET ? Right Breast abcess  # "Chronic pancreatitis"/ smoker [15ppt]   # MOLECULAR STUDIES: ALK-NEG;ROS-1-NEG     Malignant neoplasm of right upper lobe of lung ([facility]  07/30/2015 Initial Diagnosis    Malignant neoplasm of right upper lobe of lung (H[facility]     Patient was noted to have progressive brain mets and was started on opdivo on 06/24/16 as radiation could not be re attempted at a short interval. She has been doing poorly all this while- losing weight, poor appetite, generalized fatigue and weakness. Dr. BrRogue Bussingad also discussed hospice with her in the past.  Patient is currently admitted with worsening b/l LE weakness. States that if she stands longer she feels like her legs are starting to buckle and she will fall over. Denies bladder or bowel incontinence. She was recently being tapered off  steroids   ECOG PS- 2-3  Pain scale- 2   Review of systems- Review of Systems  Constitutional: Positive for malaise/fatigue and weight loss. Negative for chills and fever.  HENT: Negative for congestion, ear discharge and nosebleeds.   Eyes: Negative for blurred vision.  Respiratory: Negative for cough, hemoptysis, sputum production, shortness of breath and wheezing.   Cardiovascular: Negative for chest pain, palpitations, orthopnea and claudication.  Gastrointestinal: Negative for abdominal pain, blood in stool, constipation, diarrhea, heartburn, melena, nausea and vomiting.  Genitourinary: Negative for dysuria, flank pain, frequency, hematuria and urgency.  Musculoskeletal: Negative for back pain, joint pain and myalgias.  Skin: Negative for rash.  Neurological: Positive for weakness. Negative for dizziness, tingling, focal weakness, seizures and headaches.       B/l LE weakness  Endo/Heme/Allergies: Does not bruise/bleed easily.  Psychiatric/Behavioral: Negative for depression and suicidal ideas. The patient does not have insomnia.     Allergies  Allergen Reactions  . Sulfa Antibiotics Other (See Comments)    Unknown-last had as a child    Patient Active Problem List   Diagnosis Date Noted  . Lower extremity weakness 07/11/2016  . GERD (gastroesophageal reflux disease) 06/15/2016  . Delirium 06/12/2016  . Normocytic anemia 06/12/2016  . Counseling regarding goals of care 06/10/2016  . Brain metastasis ([facility]07/10/2015  . Hypokalemia 11/25/2015  . Encounter for antineoplastic chemotherapy 11/24/2015  . Chronic pancreatitis (HC[facility]06/20/2017  . Drug-induced neutropenia (H[facility]06/20/2017  . Malignant neoplasm of right upper lobe of lung (HC[facility]02/23/2017  .  Dyspnea   . Fall 06/02/2015  . Multiple rib fractures 06/01/2015     Past Medical History:  Diagnosis Date  . Anxiety   . Broken ribs 05/2015   5 ribs  . Chronic pancreatitis ([place])   . Concussion 06/01/2015    S/P fall-PT STATES IN PHONE INTERVIEW THAT SHE IS UNSURE IF SHE HAD A CONCUSSION OR NOT  . Frequent UTI    "sometimes" (06/01/2015)  . GERD (gastroesophageal reflux disease)   . Lung cancer ([place]) 07/13/15   METASTATIC ADENOCARCINOMA OF THE LUNG  . Lung mass    right side noted 06/01/2015  . Multiple fractures of ribs of right side 06/01/2015   "5" S/P fall  LEFT SIDE  . Shortness of breath dyspnea    5 rib fx right side     Past Surgical History:  Procedure Laterality Date  . ENDOBRONCHIAL ULTRASOUND N/A 07/13/2015   Procedure: ENDOBRONCHIAL ULTRASOUND;  Surgeon: Laverle Hobby, MD;  Location: [facility] ORS;  Service: Pulmonary;  Laterality: N/A;  . ESOPHAGOGASTRODUODENOSCOPY    . EYE MUSCLE SURGERY Left ~ 1979  . MYRINGOTOMY WITH TUBE PLACEMENT Bilateral   . PORTACATH PLACEMENT Left 07/27/2015   Procedure: INSERTION PORT-A-CATH;  Surgeon: [name], MD;  Location: [facility] ORS;  Service: General;  Laterality: Left;  . TONSILLECTOMY      Social History   Social History  . Marital status: Single    Spouse name: N/A  . Number of children: N/A  . Years of education: N/A   Occupational History  . Not on file.   Social History Main Topics  . Smoking status: Current Every Day Smoker    Packs/day: 0.50    Years: 28.00    Types: Cigarettes  . Smokeless tobacco: Never Used  . Alcohol use 8.4 oz/week    2 Glasses of wine, 12 Cans of beer per week     Comment: 06/01/2015 "drink probably 4 times/wk; either ;4 beers or 2 glasses of wine when I do drink"  . Drug use: No  . Sexual activity: Not Currently   Other Topics Concern  . Not on file   Social History Narrative  . No narrative on file     Family History  Problem Relation Age of Onset  . Colon cancer Maternal Grandfather   . CAD Maternal Grandfather   . Pancreatic cancer Maternal Grandmother      Current Facility-Administered Medications:  .  acetaminophen (TYLENOL) tablet 650 mg, 650 mg, Oral, Q6H PRN **OR**  acetaminophen (TYLENOL) suppository 650 mg, 650 mg, Rectal, Q6H PRN, Aruna Gouru, MD .  albuterol (PROVENTIL) (2.5 MG/3ML) 0.083% nebulizer solution 2.5 mg, 2.5 mg, Inhalation, Q6H PRN, Nicholes Mango, MD .  ALPRAZolam Duanne Moron) tablet 0.5 mg, 0.5 mg, Oral, TID PRN, Nicholes Mango, MD, 0.5 mg at 07/11/16 2205 .  aspirin tablet 325 mg, 325 mg, Oral, Daily, Alexis Hugelmeyer, DO, 325 mg at 07/12/16 0146 .  dexamethasone (DECADRON) injection 10 mg, 10 mg, Intravenous, Once, Daymon Larsen, MD .  docusate sodium (COLACE) capsule 100 mg, 100 mg, Oral, BID, Nicholes Mango, MD, 100 mg at 07/12/16 1057 .  enoxaparin (LOVENOX) injection 40 mg, 40 mg, Subcutaneous, Q24H, Nicholes Mango, MD, 40 mg at 07/11/16 2205 .  fentaNYL (DURAGESIC - dosed mcg/hr) 50 mcg, 50 mcg, Transdermal, Q72H, Nicholes Mango, MD, 50 mcg at 07/12/16 0101 .  folic acid (FOLVITE) tablet 1 mg, 1 mg, Oral, Daily, Sital Mody, MD, 1 mg at 07/12/16 1058 .  gabapentin (NEURONTIN) capsule 300  mg, 300 mg, Oral, BID, Nicholes Mango, MD, 300 mg at 07/12/16 1058 .  HYDROcodone-acetaminophen (NORCO/VICODIN) 5-325 MG per tablet 1 tablet, 1 tablet, Oral, Q6H PRN, Nicholes Mango, MD, 1 tablet at 07/11/16 2040 .  LORazepam (ATIVAN) tablet 1 mg, 1 mg, Oral, Q6H PRN **OR** LORazepam (ATIVAN) injection 1 mg, 1 mg, Intravenous, Q6H PRN, Bettey Costa, MD .  LORazepam (ATIVAN) tablet 0-4 mg, 0-4 mg, Oral, Q6H, 1 mg at 07/12/16 1104 **FOLLOWED BY** [START ON 07/14/2016] LORazepam (ATIVAN) tablet 0-4 mg, 0-4 mg, Oral, Q12H, Sital Mody, MD .  magnesium sulfate IVPB 2 g 50 mL, 2 g, Intravenous, Once, Bettey Costa, MD, 2 g at 07/12/16 1100 .  metoCLOPramide (REGLAN) tablet 10 mg, 10 mg, Oral, Q8H PRN, Nicholes Mango, MD .  multivitamin with minerals tablet 1 tablet, 1 tablet, Oral, Daily, Bettey Costa, MD, 1 tablet at 07/12/16 1057 .  pantoprazole (PROTONIX) EC tablet 40 mg, 40 mg, Oral, BID, Aruna Gouru, MD .  pramipexole (MIRAPEX) tablet 0.5 mg, 0.5 mg, Oral, QHS, Aruna Gouru, MD, 0.5 mg at  07/11/16 2205 .  promethazine (PHENERGAN) tablet 25 mg, 25 mg, Oral, Q8H PRN, Nicholes Mango, MD, 25 mg at 07/11/16 2041 .  thiamine (VITAMIN B-1) tablet 100 mg, 100 mg, Oral, Daily, 100 mg at 07/12/16 1058 **OR** thiamine (B-1) injection 100 mg, 100 mg, Intravenous, Daily, Bettey Costa, MD  Facility-Administered Medications Ordered in Other Encounters:  .  sodium chloride flush (NS) 0.9 % injection 10 mL, 10 mL, Intravenous, PRN, Cammie Sickle, MD, 10 mL at 03/02/16 1107   Physical exam:  Vitals:   07/11/16 2235 07/12/16 0450 07/12/16 0830 07/12/16 0833  BP: 111/62 120/68 120/69 120/69  Pulse: (!) 127 (!) 122 (!) 122 (!) 122  Resp: 19 19 17    Temp: 98.9 F (37.2 C) 100.2 F (37.9 C) 98.9 F (37.2 C) 98.9 F (37.2 C)  TempSrc: Oral Oral Oral Oral  SpO2: 95% 95% 97% 97%  Weight:      Height:       Physical Exam  Constitutional: She is oriented to person, place, and time.  Fatigued, ill appearing emotional  HENT:  Head: Normocephalic and atraumatic.  Eyes: EOM are normal. Pupils are equal, round, and reactive to light.  Neck: Normal range of motion.  Cardiovascular: Normal rate, regular rhythm and normal heart sounds.   Pulmonary/Chest: Effort normal and breath sounds normal.  Abdominal: Soft. Bowel sounds are normal.  Neurological: She is alert and oriented to person, place, and time.  Strength is 4/5 in LLE and 5/5 in RLE. B/l hand tremors  Skin: Skin is warm and dry.       CMP Latest Ref Rng & Units 07/12/2016  Glucose 65 - 99 mg/dL 95  BUN 6 - 20 mg/dL 5(L)  Creatinine 0.44 - 1.00 mg/dL 0.59  Sodium 135 - 145 mmol/L 134(L)  Potassium 3.5 - 5.1 mmol/L 3.3(L)  Chloride 101 - 111 mmol/L 100(L)  CO2 22 - 32 mmol/L 24  Calcium 8.9 - 10.3 mg/dL 8.9  Total Protein 6.5 - 8.1 g/dL 6.4(L)  Total Bilirubin 0.3 - 1.2 mg/dL 1.2  Alkaline Phos 38 - 126 U/L 52  AST 15 - 41 U/L 16  ALT 14 - 54 U/L 11(L)   CBC Latest Ref Rng & Units 07/12/2016  WBC 3.6 - 11.0 K/uL 9.5    Hemoglobin 12.0 - 16.0 g/dL 11.8(L)  Hematocrit 35.0 - 47.0 % 34.4(L)  Platelets 150 - 440 K/uL 308  Ct Head Wo Contrast  Result Date: 07/11/2016 CLINICAL DATA:  Generalized weakness, worse on the left. History of metastatic lung cancer. EXAM: CT HEAD WITHOUT CONTRAST TECHNIQUE: Contiguous axial images were obtained from the base of the skull through the vertex without intravenous contrast. COMPARISON:  Head CT 06/12/2016. FINDINGS: Brain: Right parafalcine lesion is again seen measuring 0.7 cm compared to 1.8 cm on the prior exam. Right parafalcine lesion today measures 1.0 cm compared to 1.4 cm on the prior exams. Right occipital lesion is also again noted Vasogenic edema about both lesions is increased. No hemorrhage, infarct, midline shift or abnormal extra axial collection is seen. Vascular: Negative. Skull: Intact. Sinuses/Orbits: Bilateral mastoid effusions and mucosal thickening left sphenoid sinus are again seen. Other: None. IMPRESSION: No acute abnormality. Improved appearance of intracranial lesions with increased surrounding edema consistent with post treatment change. Electronically Signed   By: Inge Rise M.D.   On: 07/11/2016 14:14   Mr Jeri Cos IZ Contrast  Result Date: 07/12/2016 CLINICAL DATA:  Weakness of the left upper extremity. Whole-brain radiotherapy started 02/04/2016. New occipital metastasis discovered December 2017, radiation therapy reportedly on hold. EXAM: MRI HEAD WITHOUT AND WITH CONTRAST TECHNIQUE: Multiplanar, multiecho pulse sequences of the brain and surrounding structures were obtained without and with intravenous contrast. CONTRAST:  88m MULTIHANCE GADOBENATE DIMEGLUMINE 529 MG/ML IV SOLN COMPARISON:  05/26/2016 FINDINGS: Brain:  4 known brain lesions: 1. 2 cm parasagittal posterior right frontal lobe - increased from 1 cm previously. 2. Up to 14 mm left parasagittal frontal parietal junction, increased from 7 mm previously. 3. Subcentimeter linear lesion  in the high and left frontal cortex, essentially stable. 4. 11 mm right inferior occipital lobe- 5 mm previously. The 3 largest lesions have rim enhancement and central diffusion hyperintensity. Although abscess could also have this appearance, this pattern has been present since at least August 2017 and is likely from necrotic internal debris. Correlation for bacteremia or infectious symptoms is recommended. Significantly progressed cerebral edema that has increased along around each lesion, particularly prominent bilaterally at the vertex. These findings in the setting of prior whole-brain radiotherapy are primarily concerning for progression. Radio necrosis is a consideration, especially if there was boost to the individual lesions. Symmetric cerebral white matter disease without mass effect has developed, likely sequela of patient's whole-brain radiotherapy. No acute hemorrhage. No hydrocephalus or shift. No acute infarct. Vascular: Preserved flow voids Skull and upper cervical spine: No marrow lesion noted. Sinuses/Orbits: Bilateral mastoid effusions. Nasopharyngeal retention cysts without mass. IMPRESSION: 1. History of brain metastases with 4 known masses. Vasogenic edema is increased around all of the lesions and 3 have increased in size 05/17/2016. Patient reportedly treated with whole-brain radiotherapy and findings are primarily concerning for progressive disease. Recommend correlation with treatment dose in the areas of progression. Please see discussion above. 2. New/progressive generalized white matter disease attributed of whole-brain radiotherapy. Electronically Signed   By: JMonte FantasiaM.D.   On: 07/12/2016 10:15   Mr Lumbar Spine Wo Contrast  Result Date: 07/11/2016 CLINICAL DATA:  Generalized weakness, worse on the left side, to the point where she is unable to walk. Pt reports she woke up Saturday morning with the weakness, last time known well was Friday. EXAM: MRI LUMBAR SPINE WITHOUT  CONTRAST TECHNIQUE: Multiplanar, multisequence MR imaging of the lumbar spine was performed. No intravenous contrast was administered. COMPARISON:  None. FINDINGS: Segmentation:  Standard. Alignment:  Physiologic. Vertebrae:  No fracture, evidence of discitis, or bone lesion. Conus medullaris: Extends  to the L1 level and appears normal. Paraspinal and other soft tissues: Negative. Disc levels: Disc spaces: Disc heights are maintained. T12-L1: No significant disc bulge. No evidence of neural foraminal stenosis. No central canal stenosis. L1-L2: No significant disc bulge. No evidence of neural foraminal stenosis. No central canal stenosis. L2-L3: No significant disc bulge. No evidence of neural foraminal stenosis. No central canal stenosis. Mild bilateral facet arthropathy. L3-L4: Mild broad-based disc bulge. Mild bilateral facet arthropathy. No evidence of neural foraminal stenosis. No central canal stenosis. L4-L5: No significant disc bulge. No evidence of neural foraminal stenosis. No central canal stenosis. L5-S1: No significant disc bulge. No evidence of neural foraminal stenosis. No central canal stenosis. Mild left facet arthropathy. IMPRESSION: 1. No acute injury of the lumbar spine. 2. No significant disc protrusion, foraminal stenosis or central canal stenosis of the lumbar spine. Electronically Signed   By: Kathreen Devoid   On: 07/11/2016 16:44    Assessment and plan- Patient is a 46 y.o. female with a h/o Stage IV NSCLC with progressive brain metastases admitted for b/l LE weakness  1. I spoke to Dr. Rogue Bussing who is patients primary oncologist as well as Dr. Baruch Gouty from [place] [place]. Brain MRI from today shows progressive brain mets and surrounding vasogenic edema. Patient has already received 2 rounds of WBRT in the past. In terms of her lung cancer- she has had a good systemic control of disease but unfortunately has progressive brain mets. She was recently started on Opdivo and received 1st dose on  06/24/16. Immunotherapy does have good CNS penetration and sometimes response from immunotherapy could take some time to show response. At this point please increase her decadron dose to 31m IV Q6. We will continue opdivo as an outpatient. If she were to decline symptomatically, Dr. CBaruch Goutyis willing to consider low dose WBRt in the future after she has an adequate trial of immunotherapy. Her overall prognosis is poor and I did discuss talking to palliative care about hospice options should her performance status not improve but she would like to hold off at this point. Encourage OOB PT/OT  2. She may have a component of adrenal insufficiency but opdivo was started only recently. Difficult to interpret cortisol levels in the presence of decadron so would hold off for now.   Dr. BRogue Bussingwill see the patient tomorrow and discuss further.   Thank you for this kind referral and the opportunity to participate in the care of this patient   Visit Diagnosis 1. Left leg weakness   2. Weakness of left upper extremity     Dr. ARanda Evens MD, MPH CNorth Caddo Medical Centerat APeacehealth United General HospitalPager- [phone]/11/2016 12:26 PM

## 2016-07-12 NOTE — Progress Notes (Signed)
Initial Nutrition Assessment  DOCUMENTATION CODES:   Not applicable  INTERVENTION:  Mighty Shake 2 BID, each supplement provides 480 calories and 20-23 grams protein  NUTRITION DIAGNOSIS:   Inadequate oral intake related to poor appetite as evidenced by per patient/family report.  GOAL:   Patient will meet greater than or equal to 90% of their needs  MONITOR:   PO intake, Supplement acceptance, Weight trends, I & O's  REASON FOR ASSESSMENT:   Malnutrition Screening Tool    ASSESSMENT:   46 y.o. female with a known history of Metastatic lung cancer with brain metastases presented to the emergency department with a chief complaint of bilateral lower extremity weakness left greater than right. Pt experiencing N/V.    Pt reports current poor appetite d/t intermittent N/V but stated the last meal she ate was Sunday. No meal completion history recorded. Per nursing student report, pt has not consumed any meals and when offered snacks she declined.  Pt stated PTA she frequently grazed and had small meals throughout the day. Pt reports consuming cheeseburger, steak and potatoes, homemade smoothie and frequently eating out at sit down restaurants. Pt consumes coffee before 11 and consumes 3-4 Twist sodas/d. Pt was consuming a Sprite at time of visit.   Pt reports no trouble chewing but occasional difficultly with swallowing liquids and reports often spewing them out afterwards. Pt reports no current taste changes but reported prior changes for a week or two "everything, even my Dr. Malachi Bonds tasted like salt".   Pt reports no recent weight loss. Pt reports UBW is between 135-145 lb. Per weight history pt's weight has been trending upwards within that range.   Pt said she does not like nutritional supplements including Ensure and refused when offered Boost, Magic cups or snacks. Pt mentioned interest in a [employer] milkshake so will add Mighty Shakes to her trays.   Pt was willing to look at  the menu and contemplated ordering at time of visit.   Per nutrition focused physical exam pt showed no muscle depletion, no fat depletion and no edema.   Labs reviewed; 10 mg Decadron, 100 mg Colace, 1 mg folic acid, multivitamin with minerals, 100 mg thiamine Medications reviewed; Na (134), K (3.3), Mg (1.4)  Diet Order:  Diet regular Room service appropriate? Yes; Fluid consistency: Thin  Skin:  Reviewed, no issues  Last BM:  2/2  Height:   Ht Readings from Last 1 Encounters:  07/11/16 '5\' 2"'$  (1.575 m)   Weight:   Wt Readings from Last 1 Encounters:  07/11/16 143 lb 9.6 oz (65.1 kg)    Ideal Body Weight:  50 kg  BMI:  Body mass index is 26.26 kg/m.  Estimated Nutritional Needs:   Kcal:  1900-2200  Protein:  75-95 grams  Fluid:  >/= 2 L/d  EDUCATION NEEDS:   Education needs no appropriate at this time  [employer] Intern

## 2016-07-12 NOTE — Clinical Social Work Note (Signed)
Clinical Social Work Assessment  Patient Details  Name: Tiffany Erickson MRN: [id number] Date of Birth: 24-Jul-1970  Date of referral:  07/12/16               Reason for consult:  Facility Placement                Permission sought to share information with:    Permission granted to share information::     Name::        Agency::     Relationship::     Contact Information:     Housing/Transportation Living arrangements for the past 2 months:  Single Family Home Source of Information:  Patient, Parent Patient Interpreter Needed:  None Criminal Activity/Legal Involvement Pertinent to Current Situation/Hospitalization:  No - Comment as needed Significant Relationships:  Parents Lives with:  Self Do you feel safe going back to the place where you live?  Yes Need for family participation in patient care:  Yes (Comment)  Care giving concerns:  Patient lives alone in [place].    Social Worker assessment / plan:  Holiday representative (CSW) reviewed chart and noted that PT is recommending SNF. CSW met with patient and her mother [name] was at bedside. Patient was alert and oriented X4 and was laying in the bed. CSW introduced self and explained role of CSW department. Patient reported that she lives alone in a town house in [place] and has several cats. Patient reported that she has cancer and goes to [facility] cancer center for an infusion of a new cancer drug twice a month. Patient reported that [facility] center Lucianne Lei picks her up. Per patient she was considering hospice and when they came to visit her at her house she was not ready for it. Patient reported that she has medicaid only and receives SSI monthly. CSW explained SNF option and that with medicaid patient would have to stay 30 days, sign over her SSI check to the facility and likely go outside of [facility]. Patient reported that she did not want to do that and declined SNF. Patient reported that she will go home. Mother inquired about home  health PT, palliative and hospice care at home. CSW explained that hospice does not provide 24/7 care at home. Patient reported that she is on the fence about hospice and does not know if she wants her. Patient's mother stated that she does want patient to have hospice services in the home. CSW explained that RN case manager will discuss home options with patient and mother. RN case manager aware of above. CSW will continue to follow and assist as needed.   Employment status:  Disabled (Comment on whether or not currently receiving Disability) Insurance information:  [place]icaid [place] PT Recommendations:  [place] / Referral to community resources:  Other (Comment Required) (Patient declined SNF. )  Patient/Family's Response to care:  Patient and mother declined SNF. Patient prefers to go home.   Patient/Family's Understanding of and Emotional Response to Diagnosis, Current Treatment, and Prognosis:  Patient reported that she is on the fence about hospice and does not know if she is ready for that. CSW provided emotional support.   Emotional Assessment Appearance:  Appears stated age Attitude/Demeanor/Rapport:    Affect (typically observed):  Pleasant Orientation:  Oriented to Self, Oriented to Place, Oriented to  Time, Oriented to Situation Alcohol / Substance use:  Not Applicable Psych involvement (Current and /or in the community):  No (Comment)  Discharge Needs  Concerns to be addressed:  Discharge Planning Concerns Readmission within the last 30 days:  Yes Current discharge risk:  Chronically ill, Terminally ill, Dependent with Mobility Barriers to Discharge:  Continued Medical Work up   [employer], Veronia Beets, LCSW 07/12/2016, 4:14 PM

## 2016-07-12 NOTE — Progress Notes (Signed)
Family Meeting Note  Advance Directive:no  Today a meeting took place with the Patient.  The following clinical team members were present during this meeting:MD RN  The following were discussed:Patient's diagnosis: Stage IV adenocarcinoma with metastatic disease to brain, Patient's progosis: < 12 months and Goals for treatment: Limited CODE STATUS  Additional follow-up to be provided: She would like palliative care consultation after discharge  Time spent during discussion:20 minutes  Tiffany Horan, MD

## 2016-07-12 NOTE — Progress Notes (Signed)
Pt noted to have new substantial weakness on left side upper and lowe extremity compared to right side. Pt also noted to have more aphasia then at the start of my shift. Prime doc notified, orders received. Ammie Dalton, RN

## 2016-07-12 NOTE — Progress Notes (Signed)
MEDICATION RELATED CONSULT NOTE - INITIAL   Pharmacy Consult for Electrolyte Replacement  Indication: hypokalemia, hypomagnesia  Allergies  Allergen Reactions  . Sulfa Antibiotics Other (See Comments)    Unknown-last had as a child   Labs:  Recent Labs  07/11/16 1324 07/12/16 0551  WBC 8.4 9.5  HGB 13.3 11.8*  HCT 37.6 34.4*  PLT 338 308  CREATININE 0.56 0.59  MG  --  1.4*  PHOS  --  3.8  ALBUMIN 4.1 3.4*  PROT 7.3 6.4*  AST 19 16  ALT 14 11*  ALKPHOS 60 52  BILITOT 1.1 1.2    Lab Results  Component Value Date   K 3.3 (L) 07/12/2016   Estimated Creatinine Clearance: 79.5 mL/min (by C-G formula based on SCr of 0.59 mg/dL).  Assessment: 46 yo F w/ lung cancer with mets admitted w/ LE weakness. Hx Etoh.  K= 3.3,  Mag= 1.4  Plan:  Will order Magnesium 2 gram IV x 1. Will order KCL PO 97mq x 1. Will recheck with am labs.  Elohim Brune A 07/12/2016,9:59 AM

## 2016-07-12 NOTE — Consult Note (Signed)
Patient with weakness, appears associated with edema related to brain lesions.  No focal ortho deficit noted, quad and patellar tendons intact. Will sign off as no orthopedic specific problem.

## 2016-07-12 NOTE — Evaluation (Signed)
Physical Therapy Evaluation Patient Details Name: Tiffany Erickson MRN: [id number] DOB: 06-17-1970 Today's Date: 07/12/2016   History of Present Illness  Pt is a 46 y.o. female with h/o metastatic lung CA with brain mets presenting to hospital with increasing LE weakness L>R.  06/11/16 AM (in hospital) pt noted with increasing L LE weakness, L UE weakness, and speech difficulty.  MRI showing progressive brain mets and surrounding vasogenic edema.  MRI of L-spine negative for any significant abnormalities.  PMH includes metastatic lung CA with brain mets (symptom control treatment only currently), anxiety, h/o R rib fx's.  Clinical Impression  Prior to hospital admission, pt and pt's mother reports pt was ambulating independently.  Pt lives alone in 2 story home (pt can sleep on main floor on couch with 1/2 bath).  Currently pt is max assist with bed mobility and once set up in sitting position pt able to sit for 20 seconds and then 40 seconds with close SBA but limited d/t fatigue (otherwise requiring min to mod assist d/t posterior lean and pt using R UE on bedrail).  Pt demonstrates L>R LE weakness and also L UE weakness (pt appearing to forget to use L UE intermittently during session requiring cueing for its use and positioning).  Pt would benefit from skilled PT to address noted impairments and functional limitations.  Pt does not appear safe to discharge home alone.  Recommend pt discharge to STR when medically appropriate.  Also recommend OT consult.    Follow Up Recommendations SNF    Equipment Recommendations   (TBD)    Recommendations for Other Services OT consult (CM notified)     Precautions / Restrictions Precautions Precautions: Fall Precaution Comments: Metastatic cancer; L chest port Restrictions Weight Bearing Restrictions: No      Mobility  Bed Mobility Overal bed mobility: Needs Assistance Bed Mobility: Supine to Sit;Sit to Supine     Supine to sit: Max assist;HOB  elevated Sit to supine: Max assist;HOB elevated   General bed mobility comments: assist for trunk and L LE; vc's required for hand positioning and use of side rail  Transfers                 General transfer comment: Deferred d/t pt's poor sitting balance and L sided weakness  Ambulation/Gait             General Gait Details: Deferred d/t pt's poor sitting balance and L sided weakness  Stairs            Wheelchair Mobility    Modified Rankin (Stroke Patients Only)       Balance Overall balance assessment: Needs assistance;History of Falls Sitting-balance support: Bilateral upper extremity supported;Feet supported Sitting balance-Leahy Scale: Poor Sitting balance - Comments: pt requiring min to mod assist to maintain sitting balance but once set up able to hold sitting balance with close SBA x20 seconds and x40 seconds with increased effort by pt (limited d/t fatigue) Postural control: Posterior lean                                   Pertinent Vitals/Pain Pain Assessment: No/denies pain  HR low 120's during session and O2 91% or greater on room air during session.    Home Living Family/patient expects to be discharged to:: Private residence Living Arrangements: Alone Available Help at Discharge: Family (Pt's mother) Type of Home: House Home Access: Stairs to enter  Entrance Stairs-Number of Steps: 1 no rail Home Layout: Two level;1/2 bath on main level;Bed/bath upstairs;Other (Comment) (pt reports she can sleep on couch on main level) Home Equipment: None      Prior Function Level of Independence: Independent         Comments: Pt's mother verifies that pt was independent prior to hospital admission (although last recent PT note during prior hospital stay pt requiring significant assist with bed mobility and no further mobility was assessed during hospital stay); pt's mom reports pt with fall 3 months ago.  Pt currently on disability.      Hand Dominance        Extremity/Trunk Assessment   Upper Extremity Assessment Upper Extremity Assessment: RUE deficits/detail;LUE deficits/detail RUE Deficits / Details: good hand grip; R elbow flexion/extension at least 3/5; R shoulder flexion at least 100 degrees AROM (no MMT d/t metastatic cancer) LUE Deficits / Details: decreased L hand grip compared to R; elbow flexion 3/5; elbow extension 2+/5; shoulder flexion AROM 80 degrees    Lower Extremity Assessment Lower Extremity Assessment: RLE deficits/detail;LLE deficits/detail RLE Deficits / Details: decreased initiation noted in general with R LE movements; hip flexion at least 3/5; knee flexion/extension at least 3/5; DF at least 3/5 (no MMT d/t metastatic disease) LLE Deficits / Details: hip flexion 1/5; knee flexion 2/5; knee extension 1/5; DF 3-/5; L DF to neutral; mild increased tone vs resistance noted L LE       Communication   Communication: Expressive difficulties  Cognition Arousal/Alertness: Awake/alert Behavior During Therapy: Flat affect Overall Cognitive Status: Impaired/Different from baseline Area of Impairment: Orientation;Problem solving Orientation Level: Disoriented to;Time;Situation (Pt reported it was May 13 18; pt reports she is here d/t falling and hitting her head (mother reports this was 3 months ago))           Problem Solving: Requires verbal cues;Decreased initiation General Comments: Pt's mother present during entire session.    General Comments   Nursing cleared pt for participation in physical therapy.  Pt agreeable to PT session.  Nursing reports that pt does not need to be on bedrest (orders for bedrest with bathroom privileges) and cleared PT to work with pt.    Exercises  Sitting balance    Assessment/Plan    PT Assessment Patient needs continued PT services  PT Problem List Decreased strength;Decreased range of motion;Decreased activity tolerance;Decreased balance;Decreased  mobility;Decreased cognition;Decreased knowledge of use of DME;Decreased safety awareness;Decreased knowledge of precautions          PT Treatment Interventions DME instruction;Gait training;Functional mobility training;Therapeutic activities;Therapeutic exercise;Balance training;Neuromuscular re-education;Patient/family education;Wheelchair mobility training    PT Goals (Current goals can be found in the Care Plan section)  Acute Rehab PT Goals Patient Stated Goal: to get stronger PT Goal Formulation: With patient Time For Goal Achievement: 07/26/16 Potential to Achieve Goals: Fair    Frequency Min 2X/week   Barriers to discharge Decreased caregiver support      Co-evaluation               End of Session   Activity Tolerance: Patient limited by fatigue Patient left: in bed;with call bell/phone within reach;with bed alarm set;with family/visitor present (B heels elevated via pillow) Nurse Communication: Mobility status;Precautions    Functional Assessment Tool Used: AM-PAC without stairs Functional Limitation: Mobility: Walking and moving around Mobility: Walking and Moving Around Current Status (N9892): At least 80 percent but less than 100 percent impaired, limited or restricted Mobility: Walking and Moving Around Goal Status (  G8979): At least 40 percent but less than 60 percent impaired, limited or restricted    Time: 1400-1436 PT Time Calculation (min) (ACUTE ONLY): 36 min   Charges:   PT Evaluation $PT Eval Low Complexity: 1 Procedure PT Treatments $Therapeutic Activity: 8-22 mins   PT G Codes:   PT G-Codes **NOT FOR INPATIENT CLASS** Functional Assessment Tool Used: AM-PAC without stairs Functional Limitation: Mobility: Walking and moving around Mobility: Walking and Moving Around Current Status (W1100): At least 80 percent but less than 100 percent impaired, limited or restricted Mobility: Walking and Moving Around Goal Status [id number]): At least 40 percent  but less than 60 percent impaired, limited or restricted    Leitha Bleak 07/12/2016, 3:22 PM Leitha Bleak, [place]

## 2016-07-12 NOTE — Progress Notes (Addendum)
[facility] NAME: Tiffany Erickson    MR#:  [id number]  DATE OF BIRTH:  [birth year]  SUBJECTIVE:   Patient feels that her left lower extremity weakness has slightly improved  REVIEW OF SYSTEMS:    Review of Systems  Constitutional: Negative.  Negative for chills, fever and malaise/fatigue.  HENT: Negative.  Negative for ear discharge, ear pain, hearing loss, nosebleeds and sore throat.   Eyes: Negative.  Negative for blurred vision and pain.  Respiratory: Negative.  Negative for cough, hemoptysis, shortness of breath and wheezing.   Cardiovascular: Negative.  Negative for chest pain, palpitations and leg swelling.  Gastrointestinal: Negative.  Negative for abdominal pain, blood in stool, diarrhea, nausea and vomiting.  Genitourinary: Negative.  Negative for dysuria.  Musculoskeletal: Positive for falls. Negative for back pain.  Skin: Negative.   Neurological: Positive for focal weakness. Negative for dizziness, tremors, speech change, seizures and headaches.  Endo/Heme/Allergies: Negative.  Does not bruise/bleed easily.  Psychiatric/Behavioral: Negative.  Negative for depression, hallucinations and suicidal ideas.    Tolerating Diet:yes      DRUG ALLERGIES:   Allergies  Allergen Reactions  . Sulfa Antibiotics Other (See Comments)    Unknown-last had as a child    VITALS:  Blood pressure 120/69, pulse (!) 122, temperature 98.9 F (37.2 C), temperature source Oral, resp. rate 17, height '5\' 2"'$  (1.575 m), weight 65.1 kg (143 lb 9.6 oz), SpO2 97 %.  PHYSICAL EXAMINATION:   Physical Exam  Constitutional: She is oriented to person, place, and time and well-developed, well-nourished, and in no distress. No distress.  HENT:  Head: Normocephalic.  Eyes: No scleral icterus.  Neck: Normal range of motion. Neck supple. No JVD present. No tracheal deviation present.  Cardiovascular: Normal rate, regular rhythm and normal heart sounds.   Exam reveals no gallop and no friction rub.   No murmur heard. Pulmonary/Chest: Effort normal and breath sounds normal. No respiratory distress. She has no wheezes. She has no rales. She exhibits no tenderness.  Abdominal: Soft. Bowel sounds are normal. She exhibits no distension and no mass. There is no tenderness. There is no rebound and no guarding.  Musculoskeletal: Normal range of motion. She exhibits no edema.  Neurological: She is alert and oriented to person, place, and time.  2/5 LLE and 4/5 LUE RLE 4/5 RUE 5/5  decreased sensation right LE  Skin: Skin is warm. No rash noted. No erythema.  Psychiatric: Affect and judgment normal.      LABORATORY PANEL:   CBC  Recent Labs Lab 07/12/16 0551  WBC 9.5  HGB 11.8*  HCT 34.4*  PLT 308   ------------------------------------------------------------------------------------------------------------------  Chemistries   Recent Labs Lab 07/12/16 0551  NA 134*  K 3.3*  CL 100*  CO2 24  GLUCOSE 95  BUN 5*  CREATININE 0.59  CALCIUM 8.9  AST 16  ALT 11*  ALKPHOS 52  BILITOT 1.2   ------------------------------------------------------------------------------------------------------------------  Cardiac Enzymes No results for input(s): TROPONINI in the last 168 hours. ------------------------------------------------------------------------------------------------------------------  RADIOLOGY:  Ct Head Wo Contrast  Result Date: 07/11/2016 CLINICAL DATA:  Generalized weakness, worse on the left. History of metastatic lung cancer. EXAM: CT HEAD WITHOUT CONTRAST TECHNIQUE: Contiguous axial images were obtained from the base of the skull through the vertex without intravenous contrast. COMPARISON:  Head CT 06/12/2016. FINDINGS: Brain: Right parafalcine lesion is again seen measuring 0.7 cm compared to 1.8 cm on the prior exam. Right parafalcine lesion today measures 1.0  cm compared to 1.4 cm on the prior exams. Right occipital  lesion is also again noted Vasogenic edema about both lesions is increased. No hemorrhage, infarct, midline shift or abnormal extra axial collection is seen. Vascular: Negative. Skull: Intact. Sinuses/Orbits: Bilateral mastoid effusions and mucosal thickening left sphenoid sinus are again seen. Other: None. IMPRESSION: No acute abnormality. Improved appearance of intracranial lesions with increased surrounding edema consistent with post treatment change. Electronically Signed   By: Inge Rise M.D.   On: 07/11/2016 14:14   Mr Lumbar Spine Wo Contrast  Result Date: 07/11/2016 CLINICAL DATA:  Generalized weakness, worse on the left side, to the point where she is unable to walk. Pt reports she woke up Saturday morning with the weakness, last time known well was Friday. EXAM: MRI LUMBAR SPINE WITHOUT CONTRAST TECHNIQUE: Multiplanar, multisequence MR imaging of the lumbar spine was performed. No intravenous contrast was administered. COMPARISON:  None. FINDINGS: Segmentation:  Standard. Alignment:  Physiologic. Vertebrae:  No fracture, evidence of discitis, or bone lesion. Conus medullaris: Extends to the L1 level and appears normal. Paraspinal and other soft tissues: Negative. Disc levels: Disc spaces: Disc heights are maintained. T12-L1: No significant disc bulge. No evidence of neural foraminal stenosis. No central canal stenosis. L1-L2: No significant disc bulge. No evidence of neural foraminal stenosis. No central canal stenosis. L2-L3: No significant disc bulge. No evidence of neural foraminal stenosis. No central canal stenosis. Mild bilateral facet arthropathy. L3-L4: Mild broad-based disc bulge. Mild bilateral facet arthropathy. No evidence of neural foraminal stenosis. No central canal stenosis. L4-L5: No significant disc bulge. No evidence of neural foraminal stenosis. No central canal stenosis. L5-S1: No significant disc bulge. No evidence of neural foraminal stenosis. No central canal stenosis. Mild  left facet arthropathy. IMPRESSION: 1. No acute injury of the lumbar spine. 2. No significant disc protrusion, foraminal stenosis or central canal stenosis of the lumbar spine. 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  1. Lower extremity weakness left greater than right: MRI lumbar spine shows no metastatic disease I'm suspecting this is related to brain metastases/edema Follow-up on brain MRI Consider starting Decadron Follow-up in oncology consultation Follow-up on physical therapy consultation and recommendations Check B12, magnesium and phosphorus level  2. EtOH abuse: Start CIWA protocol  3. stage 4 adenocarcinoma with metastatic disease to brain: Follow-up on oncology consultation Continue pain medications  4. Sinus tachycardia continue telemetry  5. Hypokalemia: Replete and check magnesium  I spoke with patient about palliative care. She wants to defer this until she leaves the hospital.   Management plans discussed with the patient and she is in agreement.  CODE STATUS: LIMITED NO MV  TOTAL TIME TAKING CARE OF THIS PATIENT: 33 minutes.     POSSIBLE D/C tomorrow, DEPENDING ON CLINICAL CONDITION.   Braeleigh Pyper M.D on 07/12/2016 at 9:07 AM  Between 7am to 6pm - Pager - [phone] After 6pm go to [email or web address] - password [email or web address] [facility]  Office  [phone]  CC: Primary care physician; Otilio Miu, MD  Note: This dictation was prepared with Dragon dictation along with smaller phrase technology. Any transcriptional errors that result from this process are unintentional.

## 2016-07-12 NOTE — Progress Notes (Addendum)
Called by RN taking care of patient, that patient has increased left lower extremity weakness and has left upper extremity weakness with speech difficulty. Patient has lung cancer with brain mets. MRI spine yesterday revealed no spinal stenosis or cord compression.CT head showed metastatic lesions with edema.Patiet seen and evaluated.Speech is coherent and comprehensive.Has left upper and bilateral lower extremity weakness.Weakness in left lower extremity is more than right.Left upper extremity weakness is pronounced. Will check mri brain to assess for new onset cva.Pt received aspirin already.Discussed with patient and RN taking care of patient.

## 2016-07-13 DIAGNOSIS — C3411 Malignant neoplasm of upper lobe, right bronchus or lung: Secondary | ICD-10-CM | POA: Diagnosis not present

## 2016-07-13 DIAGNOSIS — C7931 Secondary malignant neoplasm of brain: Secondary | ICD-10-CM | POA: Diagnosis not present

## 2016-07-13 DIAGNOSIS — R51 Headache: Secondary | ICD-10-CM | POA: Diagnosis not present

## 2016-07-13 DIAGNOSIS — Z515 Encounter for palliative care: Secondary | ICD-10-CM | POA: Diagnosis not present

## 2016-07-13 DIAGNOSIS — C799 Secondary malignant neoplasm of unspecified site: Secondary | ICD-10-CM | POA: Diagnosis not present

## 2016-07-13 DIAGNOSIS — R531 Weakness: Secondary | ICD-10-CM | POA: Diagnosis not present

## 2016-07-13 DIAGNOSIS — R29898 Other symptoms and signs involving the musculoskeletal system: Secondary | ICD-10-CM | POA: Diagnosis not present

## 2016-07-13 DIAGNOSIS — Z7189 Other specified counseling: Secondary | ICD-10-CM

## 2016-07-13 LAB — BASIC METABOLIC PANEL
Anion gap: 13 (ref 5–15)
BUN: 11 mg/dL (ref 6–20)
CHLORIDE: 98 mmol/L — AB (ref 101–111)
CO2: 22 mmol/L (ref 22–32)
CREATININE: 0.52 mg/dL (ref 0.44–1.00)
Calcium: 9.4 mg/dL (ref 8.9–10.3)
GFR calc Af Amer: >60 mL/min (ref >60)
GFR calc non Af Amer: >60 mL/min (ref >60)
Glucose, Bld: 123 mg/dL — ABNORMAL HIGH (ref 65–99)
POTASSIUM: 3.6 mmol/L (ref 3.5–5.1)
Sodium: 133 mmol/L — ABNORMAL LOW (ref 135–145)

## 2016-07-13 LAB — MAGNESIUM: MAGNESIUM: 1.7 mg/dL (ref 1.7–2.4)

## 2016-07-13 MED ORDER — MAGNESIUM SULFATE 2 GM/50ML IV SOLN
2.0000 g | Freq: Once | INTRAVENOUS | Status: AC
  Administered 2016-07-13: 10:00:00 2 g via INTRAVENOUS
  Filled 2016-07-13: qty 50

## 2016-07-13 NOTE — Consult Note (Signed)
Consultation Note Date: 07/13/2016   Patient Name: Tiffany Erickson  DOB: 09/28/1970  MRN: [id number]  Age / Sex: 46 y.o., female  PCP: Tiffany Patch, MD Referring Physician: Demetrios Loll, MD  Reason for Consultation: Establishing goals of care with regard to metastatic lung cancer and resulting weakness, hallucinations  HPI/Patient Profile: 46 y.o. female  with past medical history of lung cancer with brain metastases, chronic pancreatitis, ETOH use, and broken ribs who was admitted on 07/11/2016 with bilateral lower extremity weakness, left side > right.  Tiffany Erickson sees oncologist Dr. Rogue Erickson at [facility] for chemo infusions every 2 weeks.  MRI brain showed metastatic lesions with edema.  She has had some improvement on Decadron,and is now able to lift left leg and arm off bed.  Her systemic cancer treatment has been held during stay.  Clinical Assessment and Goals of Care: Tiffany Erickson is sitting in bed eating a banana with the help of her Tiffany Erickson.  Her arms noticeably tremble as she eats.  She is alert and friendly.  She denies being in any pain or discomfort; her only concern right now is having a plan for discharge home.  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  She lives on her own with two cats in a townhouse in [place].  Her mother lives nearby.    We speak with her aunt Tiffany Erickson outside the room.  She is visiting from her home in [facility], and says her sister, Tiffany Erickson), is on her way from [facility].  She tells [place] Areil's mother, Tiffany Erickson, is a known meth addict and unable to reliably care for Tiffany Erickson at home; she tears up as she reflects on the difficult life Tiffany Erickson has had.   Tiffany Erickson describes Tiffany Erickson's visual hallucinations she witnessed last night, but believes they are resolved at the moment.  She does think Tiffany Erickson remains fairly confused.  Tiffany Erickson is worried about the  discharge plan in light of Tiffany Erickson's current inability to walk and care for herself.  We discuss SNF as a likely plan  and possibly hospice house in the future.  We will continue to support Tiffany Erickson, Tiffany Erickson, and the family throughout this hospital course.    Dr. Rogue Erickson met with Ms. Tiffany Erickson this morning.  We spoke with him on the phone, and he gave [place] a clearer picture of Ms. Tiffany Erickson's condition.  He believes that her prognosis is limited to months.  He agrees that SNF is her best discharge disposition at this point.    After our visit, we spoke with Tiffany Erickson on the phone.  We discussed Tiffany Erickson's prognosis and her likely discharge to SNF.  The difficulty is that Tiffany Erickson will likely not want to d/c to SNF, but at this point she is unable to make appropriate decisions about her care.    We conveyed our commitment to Tiffany Erickson's well-being, and our involvement in gathering information to best serve her and the family.  She understands Tiffany Erickson's  condition and outlook.  We will keep in contact.   Primary Decision Maker:  Tiffany Erickson's two aunts Tiffany Erickson and Tiffany Erickson.      SUMMARY OF RECOMMENDATIONS    Code Status/Advance Care Planning:  Limited code  DNI  Symptom Management:   Continue per Primary Care team  Insomnia:  Per patient she is sleeping every other night, but prefers to avoid rx sleep aids  Additional Recommendations (Limitations, Scope, Preferences):  Full Scope Treatment  Psycho-social/Spiritual:   Desire for further Chaplaincy support: per Tiffany Erickson, patient isn't vocal about her spirituality, but thought a chaplain may be appreciated for companionship  Prognosis:   < 3 months this is an estimate based on the patient's enlarging brain mets and edema as well has her limited PO intake, decreased mobility and increasing periods of confusion and hallucinations.  Discharge Planning: Tiffany Erickson for rehab with Palliative care service follow-up  I would like to determine if the patient is eligible for  hospice at a SNF facility.      Primary Diagnoses: Present on Admission: . Left leg weakness metastatic lung cancer to the brain.  I have reviewed the medical record, interviewed the patient and family, and examined the patient. The following aspects are pertinent.  Past Medical History:  Diagnosis Date  . Anxiety   . Broken ribs 05/2015   5 ribs  . Chronic pancreatitis ([place])   . Concussion 06/01/2015   S/P fall-PT STATES IN PHONE INTERVIEW THAT SHE IS UNSURE IF SHE HAD A CONCUSSION OR NOT  . Frequent UTI    "sometimes" (06/01/2015)  . GERD (gastroesophageal reflux disease)   . Lung cancer ([place]) 07/13/15   METASTATIC ADENOCARCINOMA OF THE LUNG  . Lung mass    right side noted 06/01/2015  . Multiple fractures of ribs of right side 06/01/2015   "5" S/P fall  LEFT SIDE  . Shortness of breath dyspnea    5 rib fx right side   Social History   Social History  . Marital status: Single    Spouse name: N/A  . Number of children: N/A  . Years of education: N/A   Social History Main Topics  . Smoking status: Current Every Day Smoker    Packs/day: 0.50    Years: 28.00    Types: Cigarettes  . Smokeless tobacco: Never Used  . Alcohol use 8.4 oz/week    2 Glasses of wine, 12 Cans of beer per week     Comment: 06/01/2015 "drink probably 4 times/wk; either ;4 beers or 2 glasses of wine when I do drink"  . Drug use: No  . Sexual activity: Not Currently   Other Topics Concern  . None   Social History Narrative  . None   Family History  Problem Relation Age of Onset  . Colon cancer Maternal Grandfather   . CAD Maternal Grandfather   . Pancreatic cancer Maternal Grandmother    Scheduled Meds: . aspirin  325 mg Oral Daily  . dexamethasone  4 mg Intravenous Q6H  . docusate sodium  100 mg Oral BID  . enoxaparin (LOVENOX) injection  40 mg Subcutaneous Q24H  . fentaNYL  50 mcg Transdermal Q72H  . folic acid  1 mg Oral Daily  . gabapentin  300 mg Oral BID  . LORazepam  0-4  mg Oral Q6H   Followed by  . [START ON 07/14/2016] LORazepam  0-4 mg Oral Q12H  . multivitamin with minerals  1 tablet Oral Daily  . pantoprazole  40 mg Oral BID  . pramipexole  0.5 mg Oral QHS  . thiamine  100 mg Oral Daily   Or  . thiamine  100 mg Intravenous Daily   Continuous Infusions: PRN Meds:.acetaminophen **OR** acetaminophen, albuterol, ALPRAZolam, HYDROcodone-acetaminophen, LORazepam **OR** LORazepam, metoCLOPramide, promethazine Allergies  Allergen Reactions  . Sulfa Antibiotics Other (See Comments)    Unknown-last had as a child   Review of Systems denies sore throat, CP, SOB, AP, changes in bowel habits, dysuria Endorses tremor, insomnia, anorexia, extremity weakness.  Physical Exam  Wd, Wn female, A&O, pleasant Moon facies, flushed complexion CV tachycardic Resp no distress Abdomen soft  Vital Signs: BP 100/66   Pulse (!) 116   Temp 98.4 F (36.9 C) (Oral)   Resp 17   Ht 5' 2"  (1.575 m)   Wt 65.1 kg (143 lb 9.6 oz)   SpO2 98%   BMI 26.26 kg/m  Pain Assessment: No/denies pain   Pain Score: Asleep   SpO2: SpO2: 98 % O2 Device:SpO2: 98 % O2 Flow Rate: .   IO: Intake/output summary:   Intake/Output Summary (Last 24 hours) at 07/13/16 1437 Last data filed at 07/13/16 0800  Gross per 24 hour  Intake                0 ml  Output                0 ml  Net                0 ml    LBM: Last BM Date: 07/08/16 Baseline Weight: Weight: 61.7 kg (136 lb) Most recent weight: Weight: 65.1 kg (143 lb 9.6 oz)     Palliative Assessment/Data:   Flowsheet Rows   Flowsheet Row Most Recent Value  Intake Tab  Referral Department  Hospitalist  Unit at Time of Referral  Med/Surg Unit  Palliative Care Primary Diagnosis  Other (Comment)  Date Notified  07/13/16  Palliative Care Type  New Palliative care  Reason for referral  Clarify Goals of Care  Date of Admission  07/11/16  Date first seen by Palliative Care  07/13/16  # of days Palliative referral response  time  0 Day(s)  # of days IP prior to Palliative referral  2  Clinical Assessment  Palliative Performance Scale Score  30%  Psychosocial & Spiritual Assessment  Palliative Care Outcomes  Patient/Family meeting held?  Yes  Who was at the meeting?  patient and aunt Tiffany Erickson  Palliative Care Outcomes  Clarified goals of care      Time In: 9:50 Time Out: 11:00 Time Total: 70 minutes Greater than 50%  of this time was spent counseling and coordinating care related to the above assessment and plan.  Signed by: Olene Floss, PA-S Imogene Burn, PA-C Palliative Medicine Pager: [phone]  Please contact Palliative Medicine Team phone at [phone] for questions and concerns.  For individual provider: See Shea Evans

## 2016-07-13 NOTE — Progress Notes (Signed)
[facility] [place] NAME: Tiffany Erickson    MR#:  [id number]  DATE OF BIRTH:  11/13/1970  SUBJECTIVE:   Patient still has left lower extremity weakness. But she denies any headache, dizziness, numbness or tingling or incontinence.  REVIEW OF SYSTEMS:    Review of Systems  Constitutional: Negative.  Negative for chills, fever and malaise/fatigue.  HENT: Negative.  Negative for ear discharge, ear pain, hearing loss, nosebleeds and sore throat.   Eyes: Negative.  Negative for blurred vision and pain.  Respiratory: Negative.  Negative for cough, hemoptysis, shortness of breath and wheezing.   Cardiovascular: Negative.  Negative for chest pain, palpitations and leg swelling.  Gastrointestinal: Negative.  Negative for abdominal pain, blood in stool, diarrhea, nausea and vomiting.  Genitourinary: Negative.  Negative for dysuria.  Musculoskeletal: Positive for falls. Negative for back pain.  Skin: Negative.   Neurological: Positive for focal weakness. Negative for dizziness, tremors, speech change, seizures and headaches.  Endo/Heme/Allergies: Negative.  Does not bruise/bleed easily.  Psychiatric/Behavioral: Negative.  Negative for depression, hallucinations and suicidal ideas.    Tolerating Diet:yes      DRUG ALLERGIES:   Allergies  Allergen Reactions  . Sulfa Antibiotics Other (See Comments)    Unknown-last had as a child    VITALS:  Blood pressure 100/66, pulse (!) 116, temperature 98.4 F (36.9 C), temperature source Oral, resp. rate 17, height '5\' 2"'$  (1.575 m), weight 143 lb 9.6 oz (65.1 kg), SpO2 98 %.  PHYSICAL EXAMINATION:   Physical Exam  Constitutional: She is oriented to person, place, and time and well-developed, well-nourished, and in no distress. No distress.  HENT:  Head: Normocephalic.  Eyes: No scleral icterus.  Neck: Normal range of motion. Neck supple. No JVD present. No tracheal deviation present.  Cardiovascular:  Normal rate, regular rhythm and normal heart sounds.  Exam reveals no gallop and no friction rub.   No murmur heard. Pulmonary/Chest: Effort normal and breath sounds normal. No respiratory distress. She has no wheezes. She has no rales. She exhibits no tenderness.  Abdominal: Soft. Bowel sounds are normal. She exhibits no distension and no mass. There is no tenderness. There is no rebound and no guarding.  Musculoskeletal: Normal range of motion. She exhibits no edema.  Neurological: She is alert and oriented to person, place, and time.  2/5 LLE and 4/5 LUE RLE 4/5 RUE 5/5  decreased sensation right LE  Skin: Skin is warm. No rash noted. No erythema.  Psychiatric: Affect and judgment normal.      LABORATORY PANEL:   CBC  Recent Labs Lab 07/12/16 0551  WBC 9.5  HGB 11.8*  HCT 34.4*  PLT 308   ------------------------------------------------------------------------------------------------------------------  Chemistries   Recent Labs Lab 07/12/16 0551 07/13/16 0352  NA 134* 133*  K 3.3* 3.6  CL 100* 98*  CO2 24 22  GLUCOSE 95 123*  BUN 5* 11  CREATININE 0.59 0.52  CALCIUM 8.9 9.4  MG 1.4* 1.7  AST 16  --   ALT 11*  --   ALKPHOS 52  --   BILITOT 1.2  --    ------------------------------------------------------------------------------------------------------------------  Cardiac Enzymes No results for input(s): TROPONINI in the last 168 hours. ------------------------------------------------------------------------------------------------------------------  RADIOLOGY:  Mr Jeri Cos Wo Contrast  Result Date: 07/12/2016 CLINICAL DATA:  Weakness of the left upper extremity. Whole-brain radiotherapy started 02/04/2016. New occipital metastasis discovered December 2017, radiation therapy reportedly on hold. EXAM: MRI HEAD WITHOUT AND WITH CONTRAST TECHNIQUE: Multiplanar,  multiecho pulse sequences of the brain and surrounding structures were obtained without and with  intravenous contrast. CONTRAST:  68m MULTIHANCE GADOBENATE DIMEGLUMINE 529 MG/ML IV SOLN COMPARISON:  05/26/2016 FINDINGS: Brain:  4 known brain lesions: 1. 2 cm parasagittal posterior right frontal lobe - increased from 1 cm previously. 2. Up to 14 mm left parasagittal frontal parietal junction, increased from 7 mm previously. 3. Subcentimeter linear lesion in the high and left frontal cortex, essentially stable. 4. 11 mm right inferior occipital lobe- 5 mm previously. The 3 largest lesions have rim enhancement and central diffusion hyperintensity. Although abscess could also have this appearance, this pattern has been present since at least August 2017 and is likely from necrotic internal debris. Correlation for bacteremia or infectious symptoms is recommended. Significantly progressed cerebral edema that has increased along around each lesion, particularly prominent bilaterally at the vertex. These findings in the setting of prior whole-brain radiotherapy are primarily concerning for progression. Radio necrosis is a consideration, especially if there was boost to the individual lesions. Symmetric cerebral white matter disease without mass effect has developed, likely sequela of patient's whole-brain radiotherapy. No acute hemorrhage. No hydrocephalus or shift. No acute infarct. Vascular: Preserved flow voids Skull and upper cervical spine: No marrow lesion noted. Sinuses/Orbits: Bilateral mastoid effusions. Nasopharyngeal retention cysts without mass. IMPRESSION: 1. History of brain metastases with 4 known masses. Vasogenic edema is increased around all of the lesions and 3 have increased in size 05/17/2016. Patient reportedly treated with whole-brain radiotherapy and findings are primarily concerning for progressive disease. Recommend correlation with treatment dose in the areas of progression. Please see discussion above. 2. New/progressive generalized white matter disease attributed of whole-brain  radiotherapy. Electronically Signed   By: JMonte FantasiaM.D.   On: 07/12/2016 10:15   Mr Lumbar Spine Wo Contrast  Result Date: 07/11/2016 CLINICAL DATA:  Generalized weakness, worse on the left side, to the point where she is unable to walk. Pt reports she woke up Saturday morning with the weakness, last time known well was Friday. EXAM: MRI LUMBAR SPINE WITHOUT CONTRAST TECHNIQUE: Multiplanar, multisequence MR imaging of the lumbar spine was performed. No intravenous contrast was administered. COMPARISON:  None. FINDINGS: Segmentation:  Standard. Alignment:  Physiologic. Vertebrae:  No fracture, evidence of discitis, or bone lesion. Conus medullaris: Extends to the L1 level and appears normal. Paraspinal and other soft tissues: Negative. Disc levels: Disc spaces: Disc heights are maintained. T12-L1: No significant disc bulge. No evidence of neural foraminal stenosis. No central canal stenosis. L1-L2: No significant disc bulge. No evidence of neural foraminal stenosis. No central canal stenosis. L2-L3: No significant disc bulge. No evidence of neural foraminal stenosis. No central canal stenosis. Mild bilateral facet arthropathy. L3-L4: Mild broad-based disc bulge. Mild bilateral facet arthropathy. No evidence of neural foraminal stenosis. No central canal stenosis. L4-L5: No significant disc bulge. No evidence of neural foraminal stenosis. No central canal stenosis. L5-S1: No significant disc bulge. No evidence of neural foraminal stenosis. No central canal stenosis. Mild left facet arthropathy. IMPRESSION: 1. No acute injury of the lumbar spine. 2. No significant disc protrusion, foraminal stenosis or central canal stenosis of the lumbar spine. 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  1. Lower extremity weakness  left greater than right due to brain metastases/edema MRI lumbar  spine shows no metastatic disease Continue Decadron. PT: SNF. Dr. Rogue Bussing believes that her prognosis is limited to months.  He agrees that SNF is her best discharge disposition at this point.    2. EtOH abuse: on CIWA protocol  3. 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 Continue pain medications, need palliative care in SNF.  4. Sinus tachycardia continue telemetry  5. Hypokalemia: Improved with supplement.  Management plans discussed with the patient and she is in agreement.  CODE STATUS: LIMITED NO MV  TOTAL TIME TAKING CARE OF THIS PATIENT: 33 minutes.   POSSIBLE D/C tomorrow, DEPENDING ON CLINICAL CONDITION.   Demetrios Loll M.D on 07/13/2016 at 4:13 PM  Between 7am to 6pm - Pager - [phone] After 6pm go to [email or web address] - password [email or web address] [facility]  Office  [phone]  CC: Primary care physician; Otilio Miu, MD  Note: This dictation was prepared with Dragon dictation along with smaller phrase technology. Any transcriptional errors that result from this process are unintentional.

## 2016-07-13 NOTE — Progress Notes (Signed)
[place] responded to an OR for Pt in room 124 for "uplifting conversation). CH sought RN for consultation. RN stated the Pt was in denial about her limited prognosis. (medical history of lung cancer with brain metastases) RN feels the Pt will be discharged to skilled nursing, which the Pt does not want.  Pt was awake and alert. Mother was bedside. Pt seemed evasive concerning her admittance to [facility]. Pt would admit to a weakness in her leg and arm that was in her opinion better today. Pt wants to "practice walking" so that she can be discharged. Pt stated that she has not seen an MD since admittance. (RN confirmed an MD came this morning to see her) RN stated that MD wants to meet with them tomorrow at 1:30 PM. Pt and Mother asked if [facility] could attend as well. [place] agreed. CH provided the ministry of empathetic listening and prayer. [place] will follow up with this Pt at 1:30 on 07/14/16.    07/13/16 1500  Clinical Encounter Type  Visited With Patient;Patient and family together;Health care provider  Visit Type Initial;Spiritual support  Referral From Nurse  Consult/Referral To Chaplain  Spiritual Encounters  Spiritual Needs Prayer;Emotional  Stress Factors  Patient Stress Factors Health changes;Loss of control

## 2016-07-13 NOTE — Progress Notes (Signed)
Tiffany Erickson   DOB:[birth year]   FM#:[id number]    Subjective: Patient states to have slept well last night. Denies any significant pain. States to have able to move her left upper extremity more than yesterday.  ROS: No nausea no vomiting. Headaches improved. However continues to feel weak all over.  Objective:  Vitals:   07/12/16 2034 07/13/16 0539  BP: 114/66 114/74  Pulse: 95 (!) 118  Resp: 17 17  Temp: 98.7 F (37.1 C) 98.6 F (37 C)    No intake or output data in the 24 hours ending 07/13/16 0750  GENERAL Alert, no distress and comfortable. She is alone.  EYES: no pallor or icterus OROPHARYNX: no thrush or ulceration. NECK: supple, no masses felt LYMPH:  no palpable lymphadenopathy in the cervical, axillary or inguinal regions LUNGS: decreased breath sounds to auscultation at bases and  No wheeze or crackles HEART/CVS: regular rate & rhythm and no murmurs; No lower extremity edema ABDOMEN: abdomen soft, tender  on deep palpation. and normal bowel sounds Musculoskeletal:no cyanosis of digits and no clubbing  PSYCH: alert & oriented x 3 with fluent speech NEURO: 3-4/ 5 strength on the left upper lower extremity. SKIN:  no rashes or significant lesions   Labs:  Lab Results  Component Value Date   WBC 9.5 07/12/2016   HGB 11.8 (L) 07/12/2016   HCT 34.4 (L) 07/12/2016   MCV 96.0 07/12/2016   PLT 308 07/12/2016   NEUTROABS 6.9 (H) 07/11/2016    Lab Results  Component Value Date   NA 133 (L) 07/13/2016   K 3.6 07/13/2016   CL 98 (L) 07/13/2016   CO2 22 07/13/2016    Studies:  Ct Head Wo Contrast  Result Date: 07/11/2016 CLINICAL DATA:  Generalized weakness, worse on the left. History of metastatic lung cancer. EXAM: CT HEAD WITHOUT CONTRAST TECHNIQUE: Contiguous axial images were obtained from the base of the skull through the vertex without intravenous contrast. COMPARISON:  Head CT 06/12/2016. FINDINGS: Brain: Right parafalcine lesion is again seen measuring 0.7 cm  compared to 1.8 cm on the prior exam. Right parafalcine lesion today measures 1.0 cm compared to 1.4 cm on the prior exams. Right occipital lesion is also again noted Vasogenic edema about both lesions is increased. No hemorrhage, infarct, midline shift or abnormal extra axial collection is seen. Vascular: Negative. Skull: Intact. Sinuses/Orbits: Bilateral mastoid effusions and mucosal thickening left sphenoid sinus are again seen. Other: None. IMPRESSION: No acute abnormality. Improved appearance of intracranial lesions with increased surrounding edema consistent with post treatment change. Electronically Signed   By: Inge Rise M.D.   On: 07/11/2016 14:14   Mr Jeri Cos TT Contrast  Result Date: 07/12/2016 CLINICAL DATA:  Weakness of the left upper extremity. Whole-brain radiotherapy started 02/04/2016. New occipital metastasis discovered December 2017, radiation therapy reportedly on hold. EXAM: MRI HEAD WITHOUT AND WITH CONTRAST TECHNIQUE: Multiplanar, multiecho pulse sequences of the brain and surrounding structures were obtained without and with intravenous contrast. CONTRAST:  34m MULTIHANCE GADOBENATE DIMEGLUMINE 529 MG/ML IV SOLN COMPARISON:  05/26/2016 FINDINGS: Brain:  4 known brain lesions: 1. 2 cm parasagittal posterior right frontal lobe - increased from 1 cm previously. 2. Up to 14 mm left parasagittal frontal parietal junction, increased from 7 mm previously. 3. Subcentimeter linear lesion in the high and left frontal cortex, essentially stable. 4. 11 mm right inferior occipital lobe- 5 mm previously. The 3 largest lesions have rim enhancement and central diffusion hyperintensity. Although abscess could also have  this appearance, this pattern has been present since at least August 2017 and is likely from necrotic internal debris. Correlation for bacteremia or infectious symptoms is recommended. Significantly progressed cerebral edema that has increased along around each lesion, particularly  prominent bilaterally at the vertex. These findings in the setting of prior whole-brain radiotherapy are primarily concerning for progression. Radio necrosis is a consideration, especially if there was boost to the individual lesions. Symmetric cerebral white matter disease without mass effect has developed, likely sequela of patient's whole-brain radiotherapy. No acute hemorrhage. No hydrocephalus or shift. No acute infarct. Vascular: Preserved flow voids Skull and upper cervical spine: No marrow lesion noted. Sinuses/Orbits: Bilateral mastoid effusions. Nasopharyngeal retention cysts without mass. IMPRESSION: 1. History of brain metastases with 4 known masses. Vasogenic edema is increased around all of the lesions and 3 have increased in size 05/17/2016. Patient reportedly treated with whole-brain radiotherapy and findings are primarily concerning for progressive disease. Recommend correlation with treatment dose in the areas of progression. Please see discussion above. 2. New/progressive generalized white matter disease attributed of whole-brain radiotherapy. Electronically Signed   By: Monte Fantasia M.D.   On: 07/12/2016 10:15   Mr Lumbar Spine Wo Contrast  Result Date: 07/11/2016 CLINICAL DATA:  Generalized weakness, worse on the left side, to the point where she is unable to walk. Pt reports she woke up Saturday morning with the weakness, last time known well was Friday. EXAM: MRI LUMBAR SPINE WITHOUT CONTRAST TECHNIQUE: Multiplanar, multisequence MR imaging of the lumbar spine was performed. No intravenous contrast was administered. COMPARISON:  None. FINDINGS: Segmentation:  Standard. Alignment:  Physiologic. Vertebrae:  No fracture, evidence of discitis, or bone lesion. Conus medullaris: Extends to the L1 level and appears normal. Paraspinal and other soft tissues: Negative. Disc levels: Disc spaces: Disc heights are maintained. T12-L1: No significant disc bulge. No evidence of neural foraminal  stenosis. No central canal stenosis. L1-L2: No significant disc bulge. No evidence of neural foraminal stenosis. No central canal stenosis. L2-L3: No significant disc bulge. No evidence of neural foraminal stenosis. No central canal stenosis. Mild bilateral facet arthropathy. L3-L4: Mild broad-based disc bulge. Mild bilateral facet arthropathy. No evidence of neural foraminal stenosis. No central canal stenosis. L4-L5: No significant disc bulge. No evidence of neural foraminal stenosis. No central canal stenosis. L5-S1: No significant disc bulge. No evidence of neural foraminal stenosis. No central canal stenosis. Mild left facet arthropathy. IMPRESSION: 1. No acute injury of the lumbar spine. 2. No significant disc protrusion, foraminal stenosis or central canal stenosis of the lumbar spine. Electronically Signed   By: Kathreen Devoid   On: 07/11/2016 16:44    Assessment & Plan:   # 46 year old female patient with metastatic lung cancer to the brain is currently admitted to the hospital for worsening left-sided weakness.  # Worsening left-sided weakness- likely secondary to increasing metastatic disease in the brain. Slight improvement noted on Decadron. Discussed with Dr. Donella Stade.   # Metastatic lung cancer- currently on immunotherapy with Opdivo  status post cycle #2.   # Discussed with palliative care; if patient's physical status continues to decline- hospice is reasonable. We'll visit with family/ patient tomorrow at 1:30.   Cammie Sickle, MD 07/13/2016  7:50 AM

## 2016-07-13 NOTE — Progress Notes (Signed)
Advanced Care Plan.  Purpose of Encounter: palliative care  Parties in Attendance: The patient, her aunt and me. Patient's Decisional Capacity: Note sure. Subjective/Patient Story: Bilateral lower extending to weakness Objective/Medical Story: Tiffany Erickson  is a 46 y.o. female with a known history of Metastatic lung cancer with brain metastases is presenting to the emergency department with a chief complaint of bilateral lower extremity weakness left greater than right due to brain metastases/edema. Dr. Rogue Bussing believes that her prognosis is limited to months. CT evaluation suggested skilled nursing facility placement. The patient has very poor prognosis and needs to palliative care/hospice care in SNF.  Goals of Care Determinations: palliative care/hospice care in SNF.  Plan:  Code Status: Limited code (DNI) palliative care/hospice care in SNF. Time spent discussing advance care planning: 17-18 minutes.

## 2016-07-13 NOTE — NC FL2 (Signed)
[place] LEVEL OF CARE SCREENING TOOL     IDENTIFICATION  Patient Name: Tiffany Erickson Birthdate: 03/26/1971 Sex: female Admission Date (Current Location): 07/11/2016  [place] and [place] Number:  Engineering geologist and Address:  [facility], [address], [place], [place] [address]      Provider Number: [id number]  Attending Physician Name and Address:  Demetrios Loll, MD  Relative Name and Phone Number:       Current Level of Care: Hospital Recommended Level of Care: [place] Prior Approval Number:    Date Approved/Denied:   PASRR Number:  [id number])  Discharge Plan: SNF    Current Diagnoses: Patient Active Problem List   Diagnosis Date Noted  . Palliative care encounter   . Left leg weakness 07/11/2016  . GERD (gastroesophageal reflux disease) 06/15/2016  . Delirium 06/12/2016  . Normocytic anemia 06/12/2016  . Goals of care, counseling/discussion 06/10/2016  . Brain metastases ([place]) 12/09/2015  . Hypokalemia 11/25/2015  . Encounter for antineoplastic chemotherapy 11/24/2015  . Chronic pancreatitis ([place]) 11/24/2015  . Drug-induced neutropenia ([place]) 11/24/2015  . Malignant neoplasm of right upper lobe of lung ([place]) 07/30/2015  . Dyspnea   . Fall 06/02/2015  . Multiple rib fractures 06/01/2015    Orientation RESPIRATION BLADDER Height & Weight     Self, Time, Situation, Place  Normal Continent Weight: 143 lb 9.6 oz (65.1 kg) Height:  '5\' 2"'$  (157.5 cm)  BEHAVIORAL SYMPTOMS/MOOD NEUROLOGICAL BOWEL NUTRITION STATUS   (none)  ([place] mets) Continent Diet (Diet: Regular )  AMBULATORY STATUS COMMUNICATION OF NEEDS Skin   Extensive Assist Verbally Normal                       Personal Care Assistance Level of Assistance  Bathing, Feeding, Dressing Bathing Assistance: Maximum assistance Feeding assistance: Maximum assistance Dressing Assistance: Maximum assistance     Functional Limitations Info   Sight, Hearing, Speech Sight Info: Adequate Hearing Info: Adequate Speech Info: Adequate    SPECIAL CARE FACTORS FREQUENCY  PT (By licensed PT)     PT Frequency:  (3-5 )              Contractures      Additional Factors Info  Code Status, Allergies Code Status Info:  (Partial Code) Allergies Info:  (Sulfa Antibiotics )           Current Medications (07/13/2016):  This is the current hospital active medication list Current Facility-Administered Medications  Medication Dose Route Frequency Provider Last Rate Last Dose  . acetaminophen (TYLENOL) tablet 650 mg  650 mg Oral Q6H PRN Nicholes Mango, MD       Or  . acetaminophen (TYLENOL) suppository 650 mg  650 mg Rectal Q6H PRN Aruna Gouru, MD      . albuterol (PROVENTIL) (2.5 MG/3ML) 0.083% nebulizer solution 2.5 mg  2.5 mg Inhalation Q6H PRN Nicholes Mango, MD      . ALPRAZolam Duanne Moron) tablet 0.5 mg  0.5 mg Oral TID PRN Nicholes Mango, MD   0.5 mg at 07/13/16 1019  . aspirin tablet 325 mg  325 mg Oral Daily Alexis Hugelmeyer, DO   325 mg at 07/13/16 1019  . dexamethasone (DECADRON) injection 4 mg  4 mg Intravenous Q6H Sital Mody, MD   4 mg at 07/13/16 1214  . docusate sodium (COLACE) capsule 100 mg  100 mg Oral BID Nicholes Mango, MD   100 mg at 07/13/16 1019  . enoxaparin (LOVENOX)  injection 40 mg  40 mg Subcutaneous Q24H Nicholes Mango, MD   40 mg at 07/12/16 2205  . fentaNYL (DURAGESIC - dosed mcg/hr) 50 mcg  50 mcg Transdermal Q72H Nicholes Mango, MD   50 mcg at 07/12/16 0101  . folic acid (FOLVITE) tablet 1 mg  1 mg Oral Daily Sital Mody, MD   1 mg at 07/13/16 1020  . gabapentin (NEURONTIN) capsule 300 mg  300 mg Oral BID Nicholes Mango, MD   300 mg at 07/13/16 1019  . HYDROcodone-acetaminophen (NORCO/VICODIN) 5-325 MG per tablet 1 tablet  1 tablet Oral Q6H PRN Nicholes Mango, MD   1 tablet at 07/11/16 2040  . LORazepam (ATIVAN) tablet 1 mg  1 mg Oral Q6H PRN Bettey Costa, MD       Or  . LORazepam (ATIVAN) injection 1 mg  1 mg Intravenous Q6H PRN  Sital Mody, MD      . LORazepam (ATIVAN) tablet 0-4 mg  0-4 mg Oral Q6H Sital Mody, MD   2 mg at 07/13/16 0003   Followed by  . [START ON 07/14/2016] LORazepam (ATIVAN) tablet 0-4 mg  0-4 mg Oral Q12H Sital Mody, MD      . metoCLOPramide (REGLAN) tablet 10 mg  10 mg Oral Q8H PRN Nicholes Mango, MD      . multivitamin with minerals tablet 1 tablet  1 tablet Oral Daily Bettey Costa, MD   1 tablet at 07/13/16 1019  . pantoprazole (PROTONIX) EC tablet 40 mg  40 mg Oral BID Nicholes Mango, MD   40 mg at 07/13/16 1019  . pramipexole (MIRAPEX) tablet 0.5 mg  0.5 mg Oral QHS Nicholes Mango, MD   0.5 mg at 07/12/16 2205  . promethazine (PHENERGAN) tablet 25 mg  25 mg Oral Q8H PRN Nicholes Mango, MD   25 mg at 07/11/16 2041  . thiamine (VITAMIN B-1) tablet 100 mg  100 mg Oral Daily Sital Mody, MD   100 mg at 07/13/16 1019   Or  . thiamine (B-1) injection 100 mg  100 mg Intravenous Daily Bettey Costa, MD       Facility-Administered Medications Ordered in Other Encounters  Medication Dose Route Frequency Provider Last Rate Last Dose  . sodium chloride flush (NS) 0.9 % injection 10 mL  10 mL Intravenous PRN Cammie Sickle, MD   10 mL at 03/02/16 1107     Discharge Medications: Please see discharge summary for a list of discharge medications.  Relevant Imaging Results:  Relevant Lab Results:   Additional Information  (SSN: [id number])  Sample, Veronia Beets, LCSW

## 2016-07-13 NOTE — Progress Notes (Signed)
MEDICATION RELATED CONSULT NOTE -follow  up  Pharmacy Consult for Electrolyte Replacement  Indication: hypokalemia, hypomagnesia  Allergies  Allergen Reactions  . Sulfa Antibiotics Other (See Comments)    Unknown-last had as a child   Labs:  Recent Labs  07/11/16 1324 07/12/16 0551 07/13/16 0352  WBC 8.4 9.5  --   HGB 13.3 11.8*  --   HCT 37.6 34.4*  --   PLT 338 308  --   CREATININE 0.56 0.59 0.52  MG  --  1.4* 1.7  PHOS  --  3.8  --   ALBUMIN 4.1 3.4*  --   PROT 7.3 6.4*  --   AST 19 16  --   ALT 14 11*  --   ALKPHOS 60 52  --   BILITOT 1.1 1.2  --     Lab Results  Component Value Date   K 3.6 07/13/2016   Estimated Creatinine Clearance: 79.5 mL/min (by C-G formula based on SCr of 0.52 mg/dL).  Assessment: 46 yo F w/ lung cancer with mets admitted w/ LE weakness. Hx Etoh.  K= 3.6,  Mag= 1.7  Plan:  Will order Magnesium 2 gram IV x 1 as Mag level is borderline. Will recheck with am labs.  Kenn Rekowski A 07/13/2016,7:25 AM

## 2016-07-14 DIAGNOSIS — C3490 Malignant neoplasm of unspecified part of unspecified bronchus or lung: Secondary | ICD-10-CM

## 2016-07-14 DIAGNOSIS — Z66 Do not resuscitate: Secondary | ICD-10-CM | POA: Diagnosis not present

## 2016-07-14 DIAGNOSIS — C7931 Secondary malignant neoplasm of brain: Secondary | ICD-10-CM | POA: Diagnosis not present

## 2016-07-14 DIAGNOSIS — C799 Secondary malignant neoplasm of unspecified site: Secondary | ICD-10-CM

## 2016-07-14 DIAGNOSIS — Z923 Personal history of irradiation: Secondary | ICD-10-CM

## 2016-07-14 DIAGNOSIS — Z7189 Other specified counseling: Secondary | ICD-10-CM | POA: Diagnosis not present

## 2016-07-14 DIAGNOSIS — Z7952 Long term (current) use of systemic steroids: Secondary | ICD-10-CM | POA: Diagnosis not present

## 2016-07-14 DIAGNOSIS — R531 Weakness: Secondary | ICD-10-CM | POA: Diagnosis not present

## 2016-07-14 DIAGNOSIS — Z515 Encounter for palliative care: Secondary | ICD-10-CM | POA: Diagnosis not present

## 2016-07-14 LAB — BASIC METABOLIC PANEL
Anion gap: 9 (ref 5–15)
BUN: 19 mg/dL (ref 6–20)
CHLORIDE: 101 mmol/L (ref 101–111)
CO2: 24 mmol/L (ref 22–32)
CREATININE: 0.56 mg/dL (ref 0.44–1.00)
Calcium: 9.3 mg/dL (ref 8.9–10.3)
GFR calc Af Amer: >60 mL/min (ref >60)
GLUCOSE: 155 mg/dL — AB (ref 65–99)
POTASSIUM: 3.4 mmol/L — AB (ref 3.5–5.1)
SODIUM: 134 mmol/L — AB (ref 135–145)

## 2016-07-14 LAB — MAGNESIUM: MAGNESIUM: 1.9 mg/dL (ref 1.7–2.4)

## 2016-07-14 MED ORDER — PROCHLORPERAZINE EDISYLATE 5 MG/ML IJ SOLN: 10.0000 mg | Freq: Four times a day (QID) | INTRAMUSCULAR | Status: DC | PRN

## 2016-07-14 MED ORDER — POTASSIUM CHLORIDE CRYS ER 20 MEQ PO TBCR: 40.0000 meq | EXTENDED_RELEASE_TABLET | Freq: Once | ORAL | Status: DC

## 2016-07-14 NOTE — Progress Notes (Addendum)
Daily Progress Note   Patient Name: Tiffany Erickson       Date: 07/14/2016 DOB: 1970-09-30  Age: 46 y.o. MRN#: [id number] Attending Physician: Demetrios Loll, MD Primary Care Physician: Otilio Miu, MD Admit Date: 07/11/2016  Reason for Consultation/Follow-up: Establishing goals of care  Subjective: We attended Dr. Aletha Halim meeting with the patient and her mother.  He explained that chemo and radiation treatments are no longer helpful to her and it was time to focus on things that enhanced her quality of life. Code status was discussed and clarified.  Tiffany Erickson opted for DNR.  Hospice was discussed with regards to medications and assistance they would provide.  Disposition from the hospital was discussed.  Tiffany Erickson was clearly upset that she may not be independent enough to live at home again.  Dr. B explained that in a week or two she may not be even as good as she is now and that she will need help.  It was decided that PT/OT evaluations should take place and then final decisions can be made regarding disposition with the help of Tiffany Erickson's Aunts.  After the meeting I spoke with Tiffany Erickson on the phone and reviewed the meeting with her.  Tiffany Erickson and Tiffany Erickson will make themselves available either by phone or in person to discuss disposition with Tiffany Erickson after the PT and OT evaluations are complete.   Assessment: Extremity weakness improved compared to admission.  Patient understands she will no longer receive chemo / rad treatment for her lung cancer or brain mets.  She will receive medications related to her comfort including antibiotics, gabapentin, steroids etc... For as long as those things are helpful to her.   Patient Profile/HPI: 46 y.o. female  with past medical history of lung cancer with brain metastases, chronic  pancreatitis, ETOH use, and broken ribs who was admitted on 07/11/2016 with bilateral lower extremity weakness, left side > right.  Tiffany Erickson sees oncologist Dr. Rogue Bussing at [facility] chemo infusions every 2 weeks. MRI brain showed metastatic lesions with edema.  She has had some improvement on Decadron,and is now able to lift left leg and arm off bed.  Her systemic cancer treatment has been held during stay.   Length of Stay: 0  Current Medications: Scheduled Meds:  . aspirin  325 mg Oral Daily  . dexamethasone  4  mg Intravenous Q6H  . docusate sodium  100 mg Oral BID  . enoxaparin (LOVENOX) injection  40 mg Subcutaneous Q24H  . fentaNYL  50 mcg Transdermal Q72H  . folic acid  1 mg Oral Daily  . gabapentin  300 mg Oral BID  . multivitamin with minerals  1 tablet Oral Daily  . pantoprazole  40 mg Oral BID  . potassium chloride  40 mEq Oral Once  . pramipexole  0.5 mg Oral QHS  . thiamine  100 mg Oral Daily   Or  . thiamine  100 mg Intravenous Daily    Continuous Infusions:   PRN Meds: acetaminophen **OR** acetaminophen, albuterol, ALPRAZolam, HYDROcodone-acetaminophen, LORazepam **OR** LORazepam, metoCLOPramide, prochlorperazine, promethazine  Physical Exam     Wd, Wn female.  A&O x 3 Able to move all 4 extremities with purpose. NAD  Vital Signs: BP 103/73 (BP Location: Left Arm)   Pulse (!) 117   Temp 98.5 F (36.9 C) (Oral)   Resp 16   Ht '5\' 2"'$  (1.575 m)   Wt 65.1 kg (143 lb 9.6 oz)   SpO2 100%   BMI 26.26 kg/m  SpO2: SpO2: 100 % O2 Device: O2 Device: Not Delivered O2 Flow Rate:    Intake/output summary: No intake or output data in the 24 hours ending 07/14/16 1442 LBM: Last BM Date: 07/13/16 Baseline Weight: Weight: 61.7 kg (136 lb) Most recent weight: Weight: 65.1 kg (143 lb 9.6 oz)       Palliative Assessment/Data:    Flowsheet Rows   Flowsheet Row Most Recent Value  Intake Tab  Referral Department  Hospitalist  Unit at Time of Referral  Med/Surg  Unit  Palliative Care Primary Diagnosis  Other (Comment)  Date Notified  07/13/16  Palliative Care Type  New Palliative care  Reason for referral  Clarify Goals of Care  Date of Admission  07/11/16  Date first seen by Palliative Care  07/13/16  # of days Palliative referral response time  0 Day(s)  # of days IP prior to Palliative referral  2  Clinical Assessment  Palliative Performance Scale Score  30%  Psychosocial & Spiritual Assessment  Palliative Care Outcomes  Patient/Family meeting held?  Yes  Who was at the meeting?  patient, mother, Chaplain, Oncologist  Palliative Care Outcomes  Changed to focus on comfort, Counseled regarding hospice, Clarified goals of care      Patient Active Problem List   Diagnosis Date Noted  . Palliative care encounter   . Left leg weakness 07/11/2016  . GERD (gastroesophageal reflux disease) 06/15/2016  . Delirium 06/12/2016  . Normocytic anemia 06/12/2016  . Goals of care, counseling/discussion 06/10/2016  . Brain metastases ([place]) 12/09/2015  . Hypokalemia 11/25/2015  . Encounter for antineoplastic chemotherapy 11/24/2015  . Chronic pancreatitis ([place]) 11/24/2015  . Drug-induced neutropenia ([place]) 11/24/2015  . Malignant neoplasm of right upper lobe of lung ([place]) 07/30/2015  . Dyspnea   . Fall 06/02/2015  . Multiple rib fractures 06/01/2015    Palliative Care Plan    Recommendations/Plan:  No further chemo therapy or whole brain radiation therapy treatment  Transition to comfort medications and therapy.  Hospice services were recommended - but not hospice house yet.  PT / OT evaluations were ordered to help with discharge planning.   Tiffany Erickson and Tiffany Erickson will make themselves available either by phone or in person to discuss disposition with [name] after the PT and OT evaluations are complete   CSW, Mel Almond has kindly offered to coordinate a  disposition discussion on 2/9 with patient and her Aunts  DNR status was discussed and  confirmed.   Goals of Care and Additional Recommendations:  Limitations on Scope of Treatment: Full Comfort Care including IVF, Antibiotics, steroids and other medications that enhance comfort.  Code Status:  DNR  Prognosis:   < 6 months - given lung cancer with mets to the brain.  Patient with such severe side effects from chemo and radiation that these are no longer appropriate.   Discharge Planning:  To Be Determined  Care plan was discussed with patient and mother, aunts, bedside RN, chaplain.  Thank you for allowing the Palliative Medicine Team to assist in the care of this patient.  Total time spent:  60 min.     Greater than 50%  of this time was spent counseling and coordinating care related to the above assessment and plan.  Imogene Burn, PA-C Palliative Medicine  Please contact Palliative MedicineTeam phone at [phone] for questions and concerns between 7 am - 7 pm.   Please see AMION for individual provider pager numbers.

## 2016-07-14 NOTE — Progress Notes (Signed)
Physical Therapy Treatment Patient Details Name: Tiffany Erickson MRN: [id number] DOB: [birth year] Today's Date: 07/14/2016    History of Present Illness Pt is a 46 y.o. female with h/o metastatic lung CA with brain mets presenting to hospital with increasing LE weakness L>R.  06/11/16 AM (in hospital) pt noted with increasing L LE weakness, L UE weakness, and speech difficulty.  MRI showing progressive brain mets and surrounding vasogenic edema.  MRI of L-spine negative for any significant abnormalities.  PMH includes metastatic lung CA with brain mets (symptom control treatment only currently), anxiety, h/o R rib fx's.    PT Comments    Pt agreeable to PT; pt eager to go home. Discussion throughout session regarding importance of being at home especially with recent prognosis. Pt maintaining as much independence as possible is of upmost importance to pt. Pt demonstrating significant improvement from initial evaluation. Pt able to perform bed mobility with increased time only. Sitting balance normal with ability to tolerate dynamic activity without issue. Pt reluctant to use rolling walker for ambulation initially, due to poor experience previously. Discussion revealed pt had likely not been properly trained on use, or forgot safe use and was using a standard walker. Pt educated on proper use with transfers and ambulation and how to adjust for proper fit. Pt able to demonstrate safe use with education. Pt does feel this will be helpful in the home and provide greater safety and independence. Pt provided with supine and seated home exercise program with education. Post discharge pt is interested in outpatient therapy, but would not have a ride available. Pt will look into insurance provided transportation. This therapist recommending home health PT to ensure safety in the home with mobility and strengthening as able. Time in room greater than billed time due to minutes spent talking with pt empathizing with  additional stressors pt faces and encouraging pt. Continue PT to progress strength and ambulation quality for optimal safety with return home.   Follow Up Recommendations  Home health PT;Supervision - Intermittent     Equipment Recommendations  Rolling walker with 5" wheels [facility])    Recommendations for Other Services       Precautions / Restrictions Precautions Precautions: Fall Precaution Comments: Metastatic cancer; L chest port Restrictions Weight Bearing Restrictions: No    Mobility  Bed Mobility Overal bed mobility: Modified Independent Bed Mobility: Supine to Sit     Supine to sit: Modified independent (Device/Increase time) Sit to supine: Modified independent (Device/Increase time)   General bed mobility comments: Increased time overall with increased effort to scoot to edge of bed, but does so without physical assist  Transfers Overall transfer level: Needs assistance Equipment used: Rolling walker (2 wheeled) Transfers: Sit to/from Stand Sit to Stand: Min guard Stand pivot transfers: Min guard;Min assist       General transfer comment: cues for safe hand placement and proper use of rw with transfer  Ambulation/Gait Ambulation/Gait assistance: Min guard Ambulation Distance (Feet): 65 Feet Assistive device: Rolling walker (2 wheeled) Gait Pattern/deviations: Step-to pattern Gait velocity: slow Gait velocity interpretation: <1.8 ft/sec, indicative of risk for recurrent falls General Gait Details: Mild difficulty with LLE swing phase due to weakness; instruction on maintining LLE inside base of rw. Pt demonstrates awareness once cued and improved clearing. No LOB/toe catching. Discussed safety regarding ambuation on carpet at home.    Stairs            Wheelchair Mobility    Modified Rankin (Stroke Patients Only)  Balance Overall balance assessment: Needs assistance Sitting-balance support: Feet supported Sitting balance-Leahy Scale:  Good Sitting balance - Comments: Pt able to sit edge of bed without UE support independently and perform activities ie. tieing shoes reaching out of BOS without assist/LOB Postural control: Posterior lean Standing balance support: Bilateral upper extremity supported;During functional activity Standing balance-Leahy Scale: Fair                      Cognition Arousal/Alertness: Awake/alert Behavior During Therapy: WFL for tasks assessed/performed Overall Cognitive Status: Within Functional Limits for tasks assessed                 General Comments: Pt alert and orientedx4, good problem solving and safety awareness during session    Exercises Other Exercises Other Exercises: Pt provided seated and supine HEP with education to Pt and mother whom pt lives with Other Exercises: Education on safety in home with use of BSC/rw especially when home alone and on days with noted increased weakness. Problems solved situations at home to prevent falls.  Other Exercises: Education on measuring/adjusting rw to appropriate height as well as BSC    General Comments        Pertinent Vitals/Pain Pain Assessment: No/denies pain    Home Living Family/patient expects to be discharged to:: Private residence Living Arrangements: Alone Available Help at Discharge: Family (pt's mother (per chart review is meth addict, unreliable)) Type of Home: House (townhouse) Home Access: Stairs to enter Entrance Stairs-Rails: None Home Layout: Two level;1/2 bath on main level;Bed/bath upstairs;Other (Comment) (pt reports having lived on 1st floor recently, slept on couch, sponge bath in bathroom) Home Equipment: None      Prior Function Level of Independence: Independent      Comments: Pt reports independent PTA, recent hospitalization (recent PT note during prior hospital stay pt requiring significant assist with bed mobility and no further mobility was assessed during hospital stay); pt reports  fall 3 months ago.  Pt currently on disability.   PT Goals (current goals can now be found in the care plan section) Acute Rehab PT Goals Patient Stated Goal: go home Progress towards PT goals: Progressing toward goals    Frequency    Min 2X/week      PT Plan Current plan remains appropriate    Co-evaluation             End of Session Equipment Utilized During Treatment: Gait belt Activity Tolerance: Patient tolerated treatment well Patient left: in chair;with call bell/phone within reach;with family/visitor present;Other (comment) (refuses alarm; will call for assist)     Time: 3335-4562 PT Time Calculation (min) (ACUTE ONLY): 68 min  Charges:  $Gait Training: 8-22 mins $Therapeutic Exercise: 23-37 mins $Therapeutic Activity: 8-22 mins                    G Codes:      Larae Grooms, PTA 07/14/2016, 5:17 PM

## 2016-07-14 NOTE — Progress Notes (Signed)
MEDICATION RELATED CONSULT NOTE -follow  up  Pharmacy Consult for Electrolyte Replacement  Indication: hypokalemia, hypomagnesia  Allergies  Allergen Reactions  . Sulfa Antibiotics Other (See Comments)    Unknown-last had as a child   Labs:  Recent Labs  07/11/16 1324 07/12/16 0551 07/13/16 0352 07/14/16 0433  WBC 8.4 9.5  --   --   HGB 13.3 11.8*  --   --   HCT 37.6 34.4*  --   --   PLT 338 308  --   --   CREATININE 0.56 0.59 0.52 0.56  MG  --  1.4* 1.7 1.9  PHOS  --  3.8  --   --   ALBUMIN 4.1 3.4*  --   --   PROT 7.3 6.4*  --   --   AST 19 16  --   --   ALT 14 11*  --   --   ALKPHOS 60 52  --   --   BILITOT 1.1 1.2  --   --     Lab Results  Component Value Date   K 3.4 (L) 07/14/2016   Estimated Creatinine Clearance: 79.5 mL/min (by C-G formula based on SCr of 0.56 mg/dL).  Assessment: 46 yo F w/ lung cancer with mets admitted w/ LE weakness. Hx Etoh.  K= 3.4,  Mag= 1.9  Plan:  MD has already ordered KCl 100mq PO x1 for today Will recheck with am labs.  WRocky Morel2/01/2017,11:57 AM

## 2016-07-14 NOTE — Progress Notes (Signed)
Clinical Education officer, museum (CSW) met with patient alone at bedside to discuss D/C plan. Patient reported that she is now considering SNF. Per patient she is not set on SNF and may go home. CSW reminded patient that going to SNF under medicaid patient would have to stay for 30 days and sign over her SSI check. CSW made patient aware that [facility] is the only bed offer at this time. Patient reported that she would consider it. CSW will continue to follow and assist as needed.   [employer], LCSW [phone]

## 2016-07-14 NOTE — Progress Notes (Signed)
Tiffany Erickson   DOB:[birth year]   WP#:[id number]    Subjective:  Denies any significant pain. States to have able to move her left upper extremity more than yesterday.  ROS: No nausea no vomiting. Headaches improved. However continues to feel weak all over. Patient has been able to use the bathroom with some help.  Objective:  Vitals:   07/14/16 0848 07/14/16 1245  BP: 109/73 103/73  Pulse: (!) 106 (!) 117  Resp: (!) 23 16  Temp: 98 F (36.7 C) 98.5 F (36.9 C)    No intake or output data in the 24 hours ending 07/14/16 1514  GENERAL Alert, no distress and comfortable. She is alone.  EYES: no pallor or icterus OROPHARYNX: no thrush or ulceration. NECK: supple, no masses felt LYMPH:  no palpable lymphadenopathy in the cervical, axillary or inguinal regions LUNGS: decreased breath sounds to auscultation at bases and  No wheeze or crackles HEART/CVS: regular rate & rhythm and no murmurs; No lower extremity edema ABDOMEN: abdomen soft, tender  on deep palpation. and normal bowel sounds Musculoskeletal:no cyanosis of digits and no clubbing  PSYCH: alert & oriented x 3 with fluent speech NEURO: 3-4/ 5 strength on the left upper lower extremity. SKIN:  no rashes or significant lesions   Labs:  Lab Results  Component Value Date   WBC 9.5 07/12/2016   HGB 11.8 (L) 07/12/2016   HCT 34.4 (L) 07/12/2016   MCV 96.0 07/12/2016   PLT 308 07/12/2016   NEUTROABS 6.9 (H) 07/11/2016    Lab Results  Component Value Date   NA 134 (L) 07/14/2016   K 3.4 (L) 07/14/2016   CL 101 07/14/2016   CO2 24 07/14/2016    Studies:  No results found.  Assessment & Plan:   # 46 year old female patient with metastatic lung cancer to the brain is currently admitted to the hospital for worsening left-sided weakness.  # Worsening left-sided weakness- likely secondary to increasing metastatic disease in the brain. Slight improvement noted on Decadron. Discussed with Dr. Donella Stade. Do not think patient is a  further candidate for radiation given the progressive metastasis to the brain; with a previous history of brain radiation twice.  # Metastatic lung cancer- currently on immunotherapy with Opdivo  status post cycle #2.  With worsening brain metastases.  # I had a long discussion the patient and her family mother- regarding the overall poor prognosis of metastatic lung cancer to the brain. Discussed the medial left expectancy is around 6 months; however she has been a year since diagnosis.   # Discussed DNR/DNI; patient agrees to no code.  # Recommend hospice; long discussion regarding pros and cons of aggressive therapies. Discussions held in presence of palliative care RN; Haynes Dage and also chaplain. Also discussed with RN.    # 40 minutes face-to-face with the patient discussing the above plan of care; more than 50% of time spent on prognosis/ natural history; counseling and coordination.   Cammie Sickle, MD 07/14/2016  3:14 PM

## 2016-07-14 NOTE — Progress Notes (Signed)
CH made a follow up visit per Pt/Mother request to be present when the MD had consultation. Mother was bedside. PA and MD were in attendance.  Pt did not receive a good report. Pt felt sad for her family and herself at the news. Pt felt terrified at the prospect of not being able to care for herself as she is very independent. A suggestion was made that significant family members be gathered to discuss and plan for moving forward. Pt agreed. Pt asked to be educated on an AD. [place] educated the Pt on the AD. Completion may take place after the family meeting. [place] provided the ministry of spiritual support and  presence, empathetic listening, and prayer. CH is available for follow up as needed.    07/14/16 1400  Clinical Encounter Type  Visited With Patient;Patient and family together;Health care provider;Other (Comment) (MD, PA)  Visit Type Follow-up;Spiritual support  Spiritual Encounters  Spiritual Needs Prayer;Emotional;Grief support  Stress Factors  Patient Stress Factors Major life changes

## 2016-07-14 NOTE — Progress Notes (Signed)
Tiffany Erickson NAME: Tiffany Erickson    MR#:  [id number]  DATE OF BIRTH:  Dec 24, 1970  SUBJECTIVE:   Patient has better left lower extremity weakness. But she denies any headache, dizziness, numbness or tingling or incontinence.  REVIEW OF SYSTEMS:    Review of Systems  Constitutional: Negative.  Negative for chills, fever and malaise/fatigue.  HENT: Negative.  Negative for ear discharge, ear pain, hearing loss, nosebleeds and sore throat.   Eyes: Negative.  Negative for blurred vision and pain.  Respiratory: Negative.  Negative for cough, hemoptysis, shortness of breath and wheezing.   Cardiovascular: Negative.  Negative for chest pain, palpitations and leg swelling.  Gastrointestinal: Negative.  Negative for abdominal pain, blood in stool, diarrhea, nausea and vomiting.  Genitourinary: Negative.  Negative for dysuria.  Musculoskeletal: Negative for back pain and falls.  Skin: Negative.   Neurological: Positive for focal weakness. Negative for dizziness, tremors, speech change, seizures and headaches.  Endo/Heme/Allergies: Negative.  Does not bruise/bleed easily.  Psychiatric/Behavioral: Negative.  Negative for depression, hallucinations and suicidal ideas.    Tolerating Diet:yes      DRUG ALLERGIES:   Allergies  Allergen Reactions  . Sulfa Antibiotics Other (See Comments)    Unknown-last had as a child    VITALS:  Blood pressure 103/73, pulse (!) 117, temperature 98.5 F (36.9 C), temperature source Oral, resp. rate 16, height '5\' 2"'$  (1.575 m), weight 143 lb 9.6 oz (65.1 kg), SpO2 100 %.  PHYSICAL EXAMINATION:   Physical Exam  Constitutional: She is oriented to person, place, and time and well-developed, well-nourished, and in no distress. No distress.  HENT:  Head: Normocephalic.  Eyes: No scleral icterus.  Neck: Normal range of motion. Neck supple. No JVD present. No tracheal deviation present.  Cardiovascular: Normal  rate, regular rhythm and normal heart sounds.  Exam reveals no gallop and no friction rub.   No murmur heard. Pulmonary/Chest: Effort normal and breath sounds normal. No respiratory distress. She has no wheezes. She has no rales. She exhibits no tenderness.  Abdominal: Soft. Bowel sounds are normal. She exhibits no distension and no mass. There is no tenderness. There is no rebound and no guarding.  Musculoskeletal: Normal range of motion. She exhibits no edema.  Neurological: She is alert and oriented to person, place, and time.  4/5 LLE and 4/5 LUE RLE 5/5 RUE 5/5  Skin: Skin is warm. No rash noted. No erythema.  Psychiatric: Affect and judgment normal.      LABORATORY PANEL:   CBC  Recent Labs Lab 07/12/16 0551  WBC 9.5  HGB 11.8*  HCT 34.4*  PLT 308   ------------------------------------------------------------------------------------------------------------------  Chemistries   Recent Labs Lab 07/12/16 0551  07/14/16 0433  NA 134*  < > 134*  K 3.3*  < > 3.4*  CL 100*  < > 101  CO2 24  < > 24  GLUCOSE 95  < > 155*  BUN 5*  < > 19  CREATININE 0.59  < > 0.56  CALCIUM 8.9  < > 9.3  MG 1.4*  < > 1.9  AST 16  --   --   ALT 11*  --   --   ALKPHOS 52  --   --   BILITOT 1.2  --   --   < > = values in this interval not displayed. ------------------------------------------------------------------------------------------------------------------  Cardiac Enzymes No results for input(s): TROPONINI in the last 168 hours. ------------------------------------------------------------------------------------------------------------------  RADIOLOGY:  No results found.   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  1. Lower extremity weakness left greater than right due to brain metastases/edema MRI lumbar spine shows no metastatic disease Continue  Decadron. PT: SNF. Dr. Rogue Bussing believes that her prognosis is limited to months.  He Recommend hospice. PT and OT reevaluation since the patient's left side weakness is better. She wants to be discharged to home.  2. EtOH abuse: no sign of withdrawal, d/c CIWA protocol  3. 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 Continue pain medications, need hospice care in SNF/home.  4. Sinus tachycardia continue telemetry  5. Hypokalemia: Improved with supplement.  After family meeting, CODE STATUS changed to DO NOT RESUSCITATE. Management plans discussed with the patient and she is in agreement.  CODE STATUS: DNR.  TOTAL TIME TAKING CARE OF THIS PATIENT: 33 minutes.   POSSIBLE D/C tomorrow, DEPENDING ON CLINICAL CONDITION.   Tiffany Erickson M.D on 07/14/2016 at 3:37 PM  Between 7am to 6pm - Pager - [phone] After 6pm go to [email or web address] - password [email or web address]  Office  [phone]  CC: Primary care physician; Otilio Miu, MD  Note: This dictation was prepared with Dragon dictation along with smaller phrase technology. Any transcriptional errors that result from this process are unintentional.

## 2016-07-14 NOTE — Evaluation (Addendum)
Occupational Therapy Evaluation Patient Details Name: Tiffany Erickson MRN: [id number] DOB: 10/16/1970 Today's Date: 07/14/2016    History of Present Illness Pt is a 46 y.o. female with h/o metastatic lung CA with brain mets presenting to hospital with increasing LE weakness L>R.  06/11/16 AM (in hospital) pt noted with increasing L LE weakness, L UE weakness, and speech difficulty.  MRI showing progressive brain mets and surrounding vasogenic edema.  MRI of L-spine negative for any significant abnormalities.  PMH includes metastatic lung CA with brain mets (symptom control treatment only currently), anxiety, h/o R rib fx's.   Clinical Impression   Pt seen for OT evaluation this date. Pt reports being independent with most ADL at home prior to hospitalization and was driving up until this week. Pt reports living on main level of townhouse recently (sleeping on couch, sponge bath in powder room) and had moved clothing and supplies downstairs so that she did not have to negotiate the stairs. Pt presents with decreased strength, impaired cognition (although oriented and appropriate during OT evaluation this date which is a change since initial PT evaluation), impaired balance, decreased activity tolerance/knowledge of DME/AE/energy conservation strategies for ADL, and hx of falls. Pt educated in falls prevention strategies and home modifications to make the home safer. Pt reports being "hell bent" on returning home. Of note, CSW came briefly during OT session to discuss SNF options. After CSW left, pt became tearful and stated to OT, "I want to die at home. My home is my whole world." Pt has 2 cats at home which she cares for deeply. Given pt's current functional status (noting that her functional mobility seems to be improving but unsure for how long) and unclear availability of consistent support at home, OT recommending SNF. However, given pt's prognosis, strong desire to return home, and for quality of life  based on discussion with pt, home health/hospice services seems appropriate to support home modifications/falls prevention, energy conservation strategies, and maintaining functional independence to the extent possible. OT notes that oncology recommends hospice.     Follow Up Recommendations  SNF    Equipment Recommendations  3 in 1 bedside commode;Toilet rise with handles;Other (comment) (grab bars in 1/2 bath downstairs)    Recommendations for Other Services       Precautions / Restrictions Precautions Precautions: Fall Precaution Comments: Metastatic cancer; L chest port Restrictions Weight Bearing Restrictions: No      Mobility Bed Mobility Overal bed mobility: Needs Assistance Bed Mobility: Supine to Sit     Supine to sit: Min assist     General bed mobility comments: sup>sit EOB with min assist to complete so feet were flat on the floor, but pt was able to do majority by herself just took extra time to complete  Transfers Overall transfer level: Needs assistance Equipment used: 1 person hand held assist Transfers: Stand Pivot Transfers;Sit to/from Stand Sit to Stand: Min guard;Min assist Stand pivot transfers: Min guard;Min assist       General transfer comment: from EOB (HOB flat) to [facility]    Balance Overall balance assessment: Needs assistance;History of Falls Sitting-balance support: Feet supported;Single extremity supported Sitting balance-Leahy Scale: Fair Sitting balance - Comments: pt able to maintain sitting balance without physical assist but reported fatigue after approx 1-2 minutes sitting EOB Postural control: Posterior lean Standing balance support: Bilateral upper extremity supported;During functional activity Standing balance-Leahy Scale: Poor  ADL Overall ADL's : Needs assistance/impaired Eating/Feeding: Sitting;Set up   Grooming: Sitting;Set up   Upper Body Bathing: Minimal assistance;Bed level    Lower Body Bathing: Minimal assistance;Bed level   Upper Body Dressing : Sitting;Set up;Supervision/safety   Lower Body Dressing: Sitting/lateral leans;Sit to/from stand;Minimal assistance.  Pt donned socks with set up and supervision at bed level.   Toilet Transfer: BSC;Min Designer, jewellery Details (indicate cue type and reason): min guard for toilet transfer to BSC EOB with verbal cues for hand placement on BSC Toileting- Clothing Manipulation and Hygiene: Set up;Sitting/lateral lean Toileting - Clothing Manipulation Details (indicate cue type and reason): pt performed hygiene with set up and lateral lean from [facility] with no LOB; pt doffed disposable undergarment with min guard while in standing with no LOB     Functional mobility during ADLs: Min guard;Minimal assistance (min guard- single hand held assist during stand and pivot tiolet transfer to [facility] from EOB ) General ADL Comments: generally requiring min assist for LB ADL due to fatigue and decreased strength     Vision Vision Assessment?: No apparent visual deficits   Perception     Praxis Praxis Praxis tested?: Within functional limits Praxis-Other Comments: mild small hand tremors    Pertinent Vitals/Pain Pain Assessment: No/denies pain ("just in my heart")     Hand Dominance     Extremity/Trunk Assessment Upper Extremity Assessment Upper Extremity Assessment: Generalized weakness (good hand grip, no MMT d/t metastatic cancer, sufficient strength to support sit>stand transfers with min guard)   Lower Extremity Assessment Lower Extremity Assessment: Defer to PT evaluation       Communication Communication Communication: No difficulties   Cognition Arousal/Alertness: Awake/alert Behavior During Therapy: WFL for tasks assessed/performed Overall Cognitive Status: Within Functional Limits for tasks assessed                 General Comments: Pt alert and orientedx4, good problem solving and  safety awareness during session   General Comments       Exercises   Other Exercises Other Exercises: pt educated in home and routines modifications to support functional independence and falls prevention   Shoulder Instructions      Home Living Family/patient expects to be discharged to:: Private residence Living Arrangements: Alone Available Help at Discharge: Family (pt's mother (per chart review is meth addict, unreliable)) Type of Home: House (townhouse) Home Access: Stairs to enter [employer] of Steps: 1 no rail Entrance Stairs-Rails: None Home Layout: Two level;1/2 bath on main level;Bed/bath upstairs;Other (Comment) (pt reports having lived on 1st floor recently, slept on couch, sponge bath in bathroom)     Bathroom Shower/Tub: Tub/shower unit (upstairs, has not been using)   Biochemist, clinical: Standard     Home Equipment: None          Prior Functioning/Environment Level of Independence: Independent        Comments: Pt reports independent PTA, recent hospitalization (recent PT note during prior hospital stay pt requiring significant assist with bed mobility and no further mobility was assessed during hospital stay); pt reports fall 3 months ago.  Pt currently on disability.        OT Problem List: Decreased strength;Decreased cognition;Decreased activity tolerance;Decreased safety awareness;Impaired balance (sitting and/or standing)   OT Treatment/Interventions: Self-care/ADL training;Energy conservation;Therapeutic activities;Patient/family education;DME and/or AE instruction    OT Goals(Current goals can be found in the care plan section) Acute Rehab OT Goals Patient Stated Goal: go home OT Goal Formulation: With patient Time For Goal  Achievement: 07/28/16 Potential to Achieve Goals: Fair  OT Frequency: Min 2X/week   Barriers to D/C: Decreased caregiver support;Inaccessible home environment  1 STE w/ no rail, able to live on main floor,  unsure of availability/reliability of support from family, however pt very adament about returning home       Co-evaluation              End of Session Equipment Utilized During Treatment: Gait belt  Activity Tolerance: Patient limited by fatigue Patient left: in bed;with call bell/phone within reach;Other (comment) (pt left sitting EOB with PT at end of session)   Time: 3832-9191 OT Time Calculation (min): 63 min Charges:  OT General Charges $OT Visit: 1 Procedure OT Evaluation $OT Eval Moderate Complexity: 1 Procedure OT Treatments $Self Care/Home Management : 38-52 mins G-Codes: OT G-codes **NOT FOR INPATIENT CLASS** Functional Assessment Tool Used: clinical judgment Functional Limitation: Self care Self Care Current Status (Y6060): At least 20 percent but less than 40 percent impaired, limited or restricted Self Care Goal Status (O4599): At least 20 percent but less than 40 percent impaired, limited or restricted  Corky Sox, OTR/L 07/14/2016, 4:11 PM

## 2016-07-15 DIAGNOSIS — Z7952 Long term (current) use of systemic steroids: Secondary | ICD-10-CM | POA: Diagnosis not present

## 2016-07-15 DIAGNOSIS — Z79899 Other long term (current) drug therapy: Secondary | ICD-10-CM

## 2016-07-15 DIAGNOSIS — R531 Weakness: Secondary | ICD-10-CM | POA: Diagnosis not present

## 2016-07-15 DIAGNOSIS — C3490 Malignant neoplasm of unspecified part of unspecified bronchus or lung: Secondary | ICD-10-CM | POA: Diagnosis not present

## 2016-07-15 DIAGNOSIS — C7931 Secondary malignant neoplasm of brain: Secondary | ICD-10-CM | POA: Diagnosis not present

## 2016-07-15 LAB — BASIC METABOLIC PANEL
Anion gap: 10 (ref 5–15)
BUN: 15 mg/dL (ref 6–20)
CALCIUM: 9.4 mg/dL (ref 8.9–10.3)
CO2: 24 mmol/L (ref 22–32)
CREATININE: 0.67 mg/dL (ref 0.44–1.00)
Chloride: 101 mmol/L (ref 101–111)
Glucose, Bld: 116 mg/dL — ABNORMAL HIGH (ref 65–99)
Potassium: 3.7 mmol/L (ref 3.5–5.1)
SODIUM: 135 mmol/L (ref 135–145)

## 2016-07-15 LAB — MAGNESIUM: MAGNESIUM: 1.8 mg/dL (ref 1.7–2.4)

## 2016-07-15 MED ORDER — DEXAMETHASONE 4 MG PO TABS: 4.0000 mg | ORAL_TABLET | Freq: Four times a day (QID) | ORAL | 0 refills | Status: AC

## 2016-07-15 NOTE — Discharge Summary (Signed)
[facility] [place] NAME: Tiffany Erickson    MR#:  [id number]  DATE OF BIRTH:  08/10/1970  DATE OF ADMISSION:  07/11/2016   ADMITTING PHYSICIAN: Nicholes Mango, MD  DATE OF DISCHARGE: 07/15/2016  PRIMARY CARE PHYSICIAN: Otilio Miu, MD   ADMISSION DIAGNOSIS:  Left leg weakness [R29.898] DISCHARGE DIAGNOSIS:  Active Problems:   Left leg weakness   Palliative care encounter   Metastatic cancer ([place])   DNR (do not resuscitate)  SECONDARY DIAGNOSIS:   Past Medical History:  Diagnosis Date  . Anxiety   . Broken ribs 05/2015   5 ribs  . Chronic pancreatitis ([place])   . Concussion 06/01/2015   S/P fall-PT STATES IN PHONE INTERVIEW THAT SHE IS UNSURE IF SHE HAD A CONCUSSION OR NOT  . Frequent UTI    "sometimes" (06/01/2015)  . GERD (gastroesophageal reflux disease)   . Lung cancer ([place]) 07/13/15   METASTATIC ADENOCARCINOMA OF THE LUNG  . Lung mass    right side noted 06/01/2015  . Multiple fractures of ribs of right side 06/01/2015   "5" S/P fall  LEFT SIDE  . 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  1. Lower extremity weakness left greater than right due to brain metastases/edema MRI lumbar spine shows no metastatic disease Continue Decadron. PT: SNF. Dr. Rogue Bussing believes that her prognosis is limited to months. He Recommend hospice. PT and OT reevaluation suggested home health and PE.  2. EtOH abuse: no sign of withdrawal, d/c CIWA protocol  3. 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 Continue pain medications, need palliative care at home.  4. Sinus tachycardia. Improved. 5. Hypokalemia: Improved with supplement.  DISCHARGE CONDITIONS:  Stable, discharge to home with HHPT and palliative care. CONSULTS OBTAINED:    Treatment Team:  Cammie Sickle, MD Lloyd Huger, MD Hessie Knows, MD Sindy Guadeloupe, MD DRUG ALLERGIES:   Allergies  Allergen Reactions  . Sulfa Antibiotics Other (See Comments)    Unknown-last had as a child   DISCHARGE MEDICATIONS:   Allergies as of 07/15/2016      Reactions   Sulfa Antibiotics Other (See Comments)   Unknown-last had as a child      Medication List    TAKE these medications   albuterol 108 (90 Base) MCG/ACT inhaler Commonly known as:  PROVENTIL HFA;VENTOLIN HFA Inhale 2 puffs into the lungs every 6 (six) hours as needed for wheezing or shortness of breath.   ALPRAZolam 0.5 MG tablet Commonly known as:  XANAX Take 1 tablet (0.5 mg total) by mouth 3 (three) times daily as needed for anxiety.   dexamethasone 4 MG tablet Commonly known as:  DECADRON Take 1 tablet (4 mg total) by mouth 4 (four) times daily.   fentaNYL 50 MCG/HR Commonly known as:  DURAGESIC - dosed mcg/hr Place 1 patch (50 mcg total) onto the skin every 3 (three) days.   gabapentin 300 MG capsule Commonly known as:  NEURONTIN Take 1 capsule (300 mg total) by mouth 2 (two) times daily.   HYDROcodone-acetaminophen 5-325 MG tablet Commonly known as:  NORCO/VICODIN Take 1 tablet by mouth every 8 (eight) hours as needed for moderate pain.   metoCLOPramide 10 MG tablet Commonly known as:  REGLAN Take 1 tablet (10 mg  total) by mouth every 8 (eight) hours as needed for nausea or vomiting.   ondansetron 8 MG tablet Commonly known as:  ZOFRAN Take 1 tablet by mouth every 8 (eight) hours as needed.   pantoprazole 20 MG tablet Commonly known as:  PROTONIX Take 1 tablet (20 mg total) by mouth 2 (two) times daily.   pramipexole 0.5 MG tablet Commonly known as:  MIRAPEX TAKE 1 TABLET BY MOUTH AT BEDTIME   pregabalin 25 MG capsule Commonly known as:  LYRICA Take 25 mg by mouth 2 (two) times daily.   promethazine 25 MG tablet Commonly known as:  PHENERGAN Take 25 mg by  mouth every 8 (eight) hours as needed for nausea or vomiting.            Durable Medical Equipment        Start     Ordered   07/15/16 1158  For home use only DME Walker rolling  Once    Question:  Patient needs a walker to treat with the following condition  Answer:  Weakness   07/15/16 1157   07/15/16 1158  For home use only DME Bedside commode  Once    Question:  Patient needs a bedside commode to treat with the following condition  Answer:  Weakness   07/15/16 1157       DISCHARGE INSTRUCTIONS:  See AVS.  If you experience worsening of your admission symptoms, develop shortness of breath, life threatening emergency, suicidal or homicidal thoughts you must seek medical attention immediately by calling 911 or calling your MD immediately  if symptoms less severe.  You Must read complete instructions/literature along with all the possible adverse reactions/side effects for all the Medicines you take and that have been prescribed to you. Take any new Medicines after you have completely understood and accpet all the possible adverse reactions/side effects.   Please note  You were cared for by a hospitalist during your hospital stay. If you have any questions about your discharge medications or the care you received while you were in the hospital after you are discharged, you can call the unit and asked to speak with the hospitalist on call if the hospitalist that took care of you is not available. Once you are discharged, your primary care physician will handle any further medical issues. Please note that NO REFILLS for any discharge medications will be authorized once you are discharged, as it is imperative that you return to your primary care physician (or establish a relationship with a primary care physician if you do not have one) for your aftercare needs so that they can reassess your need for medications and monitor your lab values.    On the day of Discharge:  VITAL SIGNS:    Blood pressure 120/72, pulse 96, temperature 97.7 F (36.5 C), temperature source Oral, resp. rate 20, height '5\' 2"'$  (1.575 m), weight 143 lb 9.6 oz (65.1 kg), SpO2 98 %. PHYSICAL EXAMINATION:  GENERAL:  46 y.o.-year-old patient lying in the bed with no acute distress.  EYES: Pupils equal, round, reactive to light and accommodation. No scleral icterus. Extraocular muscles intact.  HEENT: Head atraumatic, normocephalic. Oropharynx and nasopharynx clear.  NECK:  Supple, no jugular venous distention. No thyroid enlargement, no tenderness.  LUNGS: Normal breath sounds bilaterally, no wheezing, rales,rhonchi or crepitation. No use of accessory muscles of respiration.  CARDIOVASCULAR: S1, S2 normal. No murmurs, rubs, or gallops.  ABDOMEN: Soft, non-tender, non-distended. Bowel sounds present. No organomegaly or mass.  EXTREMITIES:  No pedal edema, cyanosis, or clubbing.  NEUROLOGIC: Cranial nerves II through XII are intact. Muscle strength 5/5 in all extremities. Sensation intact. Gait not checked.  PSYCHIATRIC: The patient is alert and oriented x 3.  SKIN: No obvious rash, lesion, or ulcer.  DATA REVIEW:   CBC  Recent Labs Lab 07/12/16 0551  WBC 9.5  HGB 11.8*  HCT 34.4*  PLT 308    Chemistries   Recent Labs Lab 07/12/16 0551  07/15/16 0655  NA 134*  < > 135  K 3.3*  < > 3.7  CL 100*  < > 101  CO2 24  < > 24  GLUCOSE 95  < > 116*  BUN 5*  < > 15  CREATININE 0.59  < > 0.67  CALCIUM 8.9  < > 9.4  MG 1.4*  < > 1.8  AST 16  --   --   ALT 11*  --   --   ALKPHOS 52  --   --   BILITOT 1.2  --   --   < > = values in this interval not displayed.   Microbiology Results  Results for orders placed or performed during the hospital encounter of 06/11/16  Culture, blood (single)     Status: None   Collection Time: 06/12/16  8:31 AM  Result Value Ref Range Status   Specimen Description BLOOD RIGHT ARM  Final   Special Requests IN PEDIATRIC BOTTLE [facility]  Final   Culture   Final    NO  GROWTH 5 DAYS Performed at [facility]    Report Status 06/18/2016 FINAL  Final    RADIOLOGY:  No results found.   Management plans discussed with the patient, family and they are in agreement.  CODE STATUS:     Code Status Orders        Start     Ordered   07/14/16 1441  Do not attempt resuscitation (DNR)  Continuous    Question Answer Comment  In the event of cardiac or respiratory ARREST Do not call a "code blue"   In the event of cardiac or respiratory ARREST Do not perform Intubation, CPR, defibrillation or ACLS   In the event of cardiac or respiratory ARREST Use medication by any route, position, wound care, and other measures to relive pain and suffering. May use oxygen, suction and manual treatment of airway obstruction as needed for comfort.      07/14/16 1440    Code Status History    Date Active Date Inactive Code Status Order ID Comments User Context   07/12/2016  9:09 AM 07/14/2016  2:40 PM Partial Code [id number]  Bettey Costa, MD Inpatient   07/11/2016  6:43 PM 07/12/2016  9:09 AM Full Code [id number]  Nicholes Mango, MD Inpatient   07/11/2016  4:14 PM 07/11/2016  6:43 PM Full Code [id number]  Nicholes Mango, MD ED   06/14/2016  1:48 PM 06/16/2016  8:32 PM DNR [id number]  Loistine Chance, MD Inpatient   06/12/2016  6:31 AM 06/14/2016  1:48 PM Full Code [id number]  Edwin Dada, MD Inpatient   06/01/2015  1:00 PM 06/04/2015  8:16 PM Full Code [id number]  Georganna Skeans, MD Inpatient      TOTAL TIME TAKING CARE OF THIS PATIENT: 37 minutes.    Demetrios Loll M.D on 07/15/2016 at 1:54 PM  Between 7am to 6pm - Pager - [phone]  After 6pm go to [email or web address] - [employer] [facility]  Office  [phone]  CC: Primary care physician; Otilio Miu, MD   Note: This dictation was prepared with Dragon dictation along with smaller phrase technology. Any transcriptional errors that result from this process are unintentional.

## 2016-07-15 NOTE — Progress Notes (Signed)
Tiffany Erickson   DOB:[birth year]   NA#:[id number]    Subjective:  She continues to notice improvement of the strength in the left upper and lower extremities. Patient has been able to use the bathroom with some help.  ROS: Appetite is improved. Patient was to go home today. No nausea no vomiting. No difficulty swallowing.  Objective:  Vitals:   07/15/16 0502 07/15/16 0945  BP: 121/77 120/72  Pulse: 92 96  Resp: 20 20  Temp: 98.2 F (36.8 C) 97.7 F (36.5 C)     Intake/Output Summary (Last 24 hours) at 07/15/16 1306 Last data filed at 07/15/16 1005  Gross per 24 hour  Intake              120 ml  Output                0 ml  Net              120 ml    GENERAL Alert, no distress and comfortable. She is alone. Moon facies.  EYES: no pallor or icterus OROPHARYNX: no thrush or ulceration. NECK: supple, no masses felt LYMPH:  no palpable lymphadenopathy in the cervical, axillary or inguinal regions LUNGS: decreased breath sounds to auscultation at bases and  No wheeze or crackles HEART/CVS: regular rate & rhythm and no murmurs; No lower extremity edema ABDOMEN: abdomen soft, tender  on deep palpation. and normal bowel sounds Musculoskeletal:no cyanosis of digits and no clubbing  PSYCH: alert & oriented x 3 with fluent speech NEURO: 4/ 5 strength on the left upper lower extremity.Improvement of the strands noted. SKIN:  no rashes or significant lesions   Labs:  Lab Results  Component Value Date   WBC 9.5 07/12/2016   HGB 11.8 (L) 07/12/2016   HCT 34.4 (L) 07/12/2016   MCV 96.0 07/12/2016   PLT 308 07/12/2016   NEUTROABS 6.9 (H) 07/11/2016    Lab Results  Component Value Date   NA 135 07/15/2016   K 3.7 07/15/2016   CL 101 07/15/2016   CO2 24 07/15/2016    Studies:  No results found.  Assessment & Plan:   # 46 year old female patient with metastatic lung cancer to the brain is currently admitted to the hospital for worsening left-sided weakness.  # Worsening brain  metastases leading to left-sided weakness. Improved on steroids. Continue dexamethasone 4 mg every 8 hours. We'll taper steroids in approximately 1 week/next visit. Hold off radiation follow-up.  # Metastatic lung cancer- currently on immunotherapy with Opdivo  status post cycle #2.  With worsening brain metastases.  # Long discussion yesterday regarding palliative care/hospice. No decisions made yet.  # Patient to be discharged today; home with home health and physical therapy.  # Discussed DNR/DNI; patient agrees to no code.  # Overall poor prognosis. Follow-up in approximately 1 week in the clinic; patient has appointment. Instructed to keep that appointment.   Cammie Sickle, MD 07/15/2016  1:06 PM

## 2016-07-15 NOTE — Progress Notes (Signed)
MEDICATION RELATED CONSULT NOTE -follow  up  Pharmacy Consult for Electrolyte Replacement  Indication: hypokalemia, hypomagnesia  Allergies  Allergen Reactions  . Sulfa Antibiotics Other (See Comments)    Unknown-last had as a child   Labs:  Recent Labs  07/13/16 0352 07/14/16 0433 07/15/16 0655  CREATININE 0.52 0.56 0.67  MG 1.7 1.9 1.8    Lab Results  Component Value Date   K 3.7 07/15/2016   Estimated Creatinine Clearance: 79.5 mL/min (by C-G formula based on SCr of 0.67 mg/dL).  Assessment: 46 yo F w/ lung cancer with mets admitted w/ LE weakness. Hx Etoh.  K= 3.7,  Mag= 1.8  Plan:  No supplementation needed at this time.  Will recheck with am labs.  Rocky Morel 07/15/2016,9:06 AM

## 2016-07-15 NOTE — Progress Notes (Signed)
[place] referral for SN, PT and home Palliative services received from [facility]. 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 She has been receiving Opidivo at the [facility]. She lives alone, with her mother checking in on her and providing meal assistance. She will discharge with a rolling walker and bedside commode to be provided by [facility].  Patient seen sitting up in bed, alert and interactive, forgetful at times. She expressed her feelings of denial regarding her cancer and terminal diagnosis. She again expressed her feelings about not being ready for hospice. Plan is for her to discharge today via [place] with her mother providing transportation. [facility] services outlined, [facility] brochure/information given. Patient information faxed to referral. Thank you. Flo Shanks RN, BSN, [place], hospital Liaison [phone] c

## 2016-07-15 NOTE — Progress Notes (Signed)
PT is recommending home health. Per RN case manager she will make arrangements. Please reconsult if future social work needs arise. CSW signing off.   [employer], [place]) [phone]

## 2016-07-15 NOTE — Discharge Instructions (Signed)
Fall precaution. HHPT. Palliative care.

## 2016-07-15 NOTE — Care Management (Addendum)
Spoke with Tiffany Erickson at the bedside. States she lives alone, but her mother is in the home most of the time. Mother is Tiffany Erickson [phone]). Last seen Dr, [name] 2.5 weeks ago. No home health, skilled nursing or home oxygen. Fall prior to this admission. Fair appetite. Takes care of all basic activities of daily living herself, drives. No equipment in the home. Prescriptions are filled at [facility]. Mother will transport. Physical therapy evaluation completed. Recommended Home Heath and therapy in the home. Discussed agencies. Chose [facility]. Requested bedside commode and rolling walker. [facility] will provide equipment Discharge to home today per Dr, Bridgett Larsson [name] RN MSN CCM Care Management .

## 2016-07-20 ENCOUNTER — Other Ambulatory Visit: Payer: Medicaid Other

## 2016-07-21 ENCOUNTER — Telehealth: Payer: Self-pay | Admitting: *Deleted

## 2016-07-21 NOTE — Telephone Encounter (Signed)
These orders were signed and faxed yesterday

## 2016-07-21 NOTE — Telephone Encounter (Signed)
Asking to have Palliative Care orders signed and faxed back to them

## 2016-07-22 ENCOUNTER — Other Ambulatory Visit: Payer: Medicaid Other

## 2016-07-22 ENCOUNTER — Inpatient Hospital Stay (HOSPITAL_BASED_OUTPATIENT_CLINIC_OR_DEPARTMENT_OTHER): Payer: Medicaid Other | Admitting: Internal Medicine

## 2016-07-22 ENCOUNTER — Telehealth: Payer: Self-pay | Admitting: Internal Medicine

## 2016-07-22 ENCOUNTER — Inpatient Hospital Stay: Payer: Medicaid Other

## 2016-07-22 VITALS — BP 111/75 | HR 118 | Temp 97.5°F | Resp 20 | Ht 62.0 in | Wt 142.0 lb

## 2016-07-22 DIAGNOSIS — R600 Localized edema: Secondary | ICD-10-CM

## 2016-07-22 DIAGNOSIS — F17210 Nicotine dependence, cigarettes, uncomplicated: Secondary | ICD-10-CM | POA: Diagnosis not present

## 2016-07-22 DIAGNOSIS — Z5112 Encounter for antineoplastic immunotherapy: Secondary | ICD-10-CM | POA: Diagnosis not present

## 2016-07-22 DIAGNOSIS — C771 Secondary and unspecified malignant neoplasm of intrathoracic lymph nodes: Secondary | ICD-10-CM

## 2016-07-22 DIAGNOSIS — Z79899 Other long term (current) drug therapy: Secondary | ICD-10-CM | POA: Diagnosis not present

## 2016-07-22 DIAGNOSIS — C7931 Secondary malignant neoplasm of brain: Secondary | ICD-10-CM | POA: Diagnosis not present

## 2016-07-22 DIAGNOSIS — Z7952 Long term (current) use of systemic steroids: Secondary | ICD-10-CM

## 2016-07-22 DIAGNOSIS — G4700 Insomnia, unspecified: Secondary | ICD-10-CM

## 2016-07-22 DIAGNOSIS — C3411 Malignant neoplasm of upper lobe, right bronchus or lung: Secondary | ICD-10-CM

## 2016-07-22 DIAGNOSIS — Z800 Family history of malignant neoplasm of digestive organs: Secondary | ICD-10-CM | POA: Diagnosis not present

## 2016-07-22 DIAGNOSIS — F419 Anxiety disorder, unspecified: Secondary | ICD-10-CM

## 2016-07-22 DIAGNOSIS — Z923 Personal history of irradiation: Secondary | ICD-10-CM | POA: Diagnosis not present

## 2016-07-22 DIAGNOSIS — Z9221 Personal history of antineoplastic chemotherapy: Secondary | ICD-10-CM

## 2016-07-22 LAB — COMPREHENSIVE METABOLIC PANEL
ALT: 13 U/L — ABNORMAL LOW (ref 14–54)
AST: 24 U/L (ref 15–41)
Albumin: 3.9 g/dL (ref 3.5–5.0)
Alkaline Phosphatase: 50 U/L (ref 38–126)
Anion gap: 10 (ref 5–15)
BILIRUBIN TOTAL: 0.5 mg/dL (ref 0.3–1.2)
BUN: 12 mg/dL (ref 6–20)
CO2: 19 mmol/L — ABNORMAL LOW (ref 22–32)
Calcium: 9.1 mg/dL (ref 8.9–10.3)
Chloride: 105 mmol/L (ref 101–111)
Creatinine, Ser: 0.55 mg/dL (ref 0.44–1.00)
Glucose, Bld: 116 mg/dL — ABNORMAL HIGH (ref 65–99)
POTASSIUM: 3.1 mmol/L — AB (ref 3.5–5.1)
Sodium: 134 mmol/L — ABNORMAL LOW (ref 135–145)
Total Protein: 6.9 g/dL (ref 6.5–8.1)

## 2016-07-22 LAB — CBC WITH DIFFERENTIAL/PLATELET
Basophils Absolute: 0.1 10*3/uL (ref 0–0.1)
Basophils Relative: 1 %
EOS PCT: 0 %
Eosinophils Absolute: 0.1 10*3/uL (ref 0–0.7)
HEMATOCRIT: 40 % (ref 35.0–47.0)
Hemoglobin: 13.7 g/dL (ref 12.0–16.0)
LYMPHS PCT: 7 %
Lymphs Abs: 1.3 10*3/uL (ref 1.0–3.6)
MCH: 32.9 pg (ref 26.0–34.0)
MCHC: 34.3 g/dL (ref 32.0–36.0)
MCV: 95.9 fL (ref 80.0–100.0)
MONO ABS: 1.2 10*3/uL — AB (ref 0.2–0.9)
MONOS PCT: 7 %
NEUTROS ABS: 14.8 10*3/uL — AB (ref 1.4–6.5)
Neutrophils Relative %: 85 %
PLATELETS: 464 10*3/uL — AB (ref 150–440)
RBC: 4.17 MIL/uL (ref 3.80–5.20)
RDW: 16.3 % — AB (ref 11.5–14.5)
WBC: 17.4 10*3/uL — ABNORMAL HIGH (ref 3.6–11.0)

## 2016-07-22 MED ORDER — FENTANYL 50 MCG/HR TD PT72: 50.0000 ug | MEDICATED_PATCH | TRANSDERMAL | 0 refills | Status: AC

## 2016-07-22 MED ORDER — FENTANYL 50 MCG/HR TD PT72: 50.0000 ug | MEDICATED_PATCH | TRANSDERMAL | 0 refills | Status: DC

## 2016-07-22 NOTE — Telephone Encounter (Signed)
Please make a referral to [facility] exercise care program. Dr.B

## 2016-07-22 NOTE — Progress Notes (Signed)
[place] NOTE  Patient Care Team: Juline Patch, MD as PCP - General (Family Medicine) Seeplaputhur Robinette Haines, MD (General Surgery)  CHIEF COMPLAINTS/PURPOSE OF CONSULTATION:   Oncology History   # FEB 2017- ADENOCARCINOMA RUL [T2N2; STAGE III Bronch/FNA- Right paratracheal LN;Dr.Ram]. PET- no distant mets; March 3rd START CARBO-TAXOL Weekly +RT [until may 5th]; June 21st 2017- last dose of consolidation carbo-taxol; Jan 12 2016- PET- Improved on RUL mass/LN  # FEB 2017- MRI Brain 56m focus of met [Right frontal]; Jan 12 2016- 3 new brain mets [start WBRT 8/31]; MRI- DEC 2017- New occipital met-~566m RT on HOLD.   # JAN 2018- START OPIDIVO  # left breast abscess; Aug 2017- PET ? Right Breast abcess  # "Chronic pancreatitis"/ smoker [15ppt]   # MOLECULAR STUDIES: ALK-NEG;ROS-1-NEG     Malignant ne[place] of right upper lobe of lung ([place]  07/30/2015 Initial Diagnosis    Malignant neoplasm of right upper lobe of lung (HCC)        HISTORY OF PRESENTING ILLNESS:  Tiffany Erickson [age].o.  female  history of stage IV lung cancer adenocarcinoma- Currently on surveillance is here for follow-up.  In the interim patient was admitted to the hospital for generalized weakness; noted to have progressive brain metastases. Patient is currently on steroids dexamethasone 4 mg every 6 hours. Patient complains of difficulty sleeping at night. Denies any difficulty swallowing no nausea or vomiting. No headaches. She does complains of generalized weakness. Patient was evaluated by PaSaralyn Pilarare in the hospital. Declined hospice. Patient is interested in physical therapy at the hospital.   ROS: A complete 10 point review of system is done which is negative except mentioned above in history of present illness  MEDICAL HISTORY:  Past Medical History:  Diagnosis Date  . Anxiety   . Broken ribs 05/2015   5 ribs  . Chronic pancreatitis ([facility]  . Concussion 06/01/2015   S/P  fall-PT STATES IN PHONE INTERVIEW THAT SHE IS UNSURE IF SHE HAD A CONCUSSION OR NOT  . Frequent UTI    "sometimes" (06/01/2015)  . GERD (gastroesophageal reflux disease)   . [place]   METASTATIC ADENOCARCINOMA OF THE LUNG  . Lung mass    right side noted 06/01/2015  . Multiple fractures of ribs of right side 06/01/2015   "5" S/P fall  LEFT SIDE  . Shortness of breath dyspnea    5 rib fx right side    SURGICAL HISTORY: Past Surgical History:  Procedure Laterality Date  . ENDOBRONCHIAL ULTRASOUND N/A 07/13/2015   Procedure: ENDOBRONCHIAL ULTRASOUND;  Surgeon: PrLaverle HobbyMD;  Location: [facility] ORS;  Service: Pulmonary;  Laterality: N/A;  . ESOPHAGOGASTRODUODENOSCOPY    . EYE MUSCLE SURGERY Left ~ 1979  . MYRINGOTOMY WITH TUBE PLACEMENT Bilateral   . PORTACATH PLACEMENT Left 07/27/2015   Procedure: INSERTION PORT-A-CATH;  Surgeon: TiNestor LewandowskyMD;  Location: [facility] ORS;  Service: General;  Laterality: Left;  . TONSILLECTOMY      SOCIAL HISTORY: Social History   Social History  . Marital status: Single    Spouse name: N/A  . Number of children: N/A  . Years of education: N/A   Occupational History  . Not on file.   Social History Main Topics  . Smoking status: Current Every Day Smoker    Packs/day: 0.50    Years: 28.00    Types: Cigarettes  . Smokeless tobacco: Never Used  . Alcohol use 8.4 oz/week  2 Glasses of wine, 12 Cans of beer per week     Comment: 06/01/2015 "drink probably 4 times/wk; either ;4 beers or 2 glasses of wine when I do drink"  . Drug use: No  . Sexual activity: Not Currently   Other Topics Concern  . Not on file   Social History Narrative  . No narrative on file    FAMILY HISTORY: Family History  Problem Relation Age of Onset  . Colon cancer Maternal Grandfather   . CAD Maternal Grandfather   . Pancreatic cancer Maternal Grandmother     ALLERGIES:  is allergic to sulfa antibiotics.  MEDICATIONS:  Current  Outpatient Prescriptions  Medication Sig Dispense Refill  . albuterol (PROVENTIL HFA;VENTOLIN HFA) 108 (90 Base) MCG/ACT inhaler Inhale 2 puffs into the lungs every 6 (six) hours as needed for wheezing or shortness of breath. 1 Inhaler 2  . ALPRAZolam (XANAX) 0.5 MG tablet Take 1 tablet (0.5 mg total) by mouth 3 (three) times daily as needed for anxiety. 15 tablet 0  . alprazolam (XANAX) 2 MG tablet Take 1 tablet by mouth every 6 (six) hours as needed. Alternates with 0.5 mg  2  . dexamethasone (DECADRON) 4 MG tablet Take 1 tablet (4 mg total) by mouth 4 (four) times daily. 120 tablet 0  . fentaNYL (DURAGESIC - DOSED MCG/HR) 50 MCG/HR Place 1 patch (50 mcg total) onto the skin every 3 (three) days. 10 patch 0  . gabapentin (NEURONTIN) 300 MG capsule Take 1 capsule (300 mg total) by mouth 2 (two) times daily. 60 capsule 2  . HYDROcodone-acetaminophen (NORCO/VICODIN) 5-325 MG tablet Take 1 tablet by mouth every 8 (eight) hours as needed for moderate pain. 90 tablet 0  . metoCLOPramide (REGLAN) 10 MG tablet Take 1 tablet (10 mg total) by mouth every 8 (eight) hours as needed for nausea or vomiting. 90 tablet 5  . ondansetron (ZOFRAN) 8 MG tablet Take 1 tablet by mouth every 8 (eight) hours as needed.  2  . pantoprazole (PROTONIX) 20 MG tablet Take 1 tablet (20 mg total) by mouth 2 (two) times daily. 60 tablet 6  . pramipexole (MIRAPEX) 0.5 MG tablet TAKE 1 TABLET BY MOUTH AT BEDTIME  2  . promethazine (PHENERGAN) 25 MG tablet Take 25 mg by mouth every 8 (eight) hours as needed for nausea or vomiting.    . nystatin (MYCOSTATIN) 100000 UNIT/ML suspension Take 5 mLs by mouth 4 (four) times daily.     No current facility-administered medications for this visit.    Facility-Administered Medications Ordered in Other Visits  Medication Dose Route Frequency Provider Last Rate Last Dose  . sodium chloride flush (NS) 0.9 % injection 10 mL  10 mL Intravenous PRN Cammie Sickle, MD   10 mL at 03/02/16  1107      .  PHYSICAL EXAMINATION: ECOG PERFORMANCE STATUS: 0 - Asymptomatic  Vitals:   07/22/16 0931  BP: 111/75  Pulse: (!) 118  Resp: 20  Temp: 97.5 F (36.4 C)   Filed Weights   07/22/16 0931  Weight: 142 lb (64.4 kg)    GENERAL: Thin build; cachectic-appearing female patient Alert, no distress and comfortable.She Is alone. She is walking herself. EYES: no pallor or icterus OROPHARYNX: No thrush significantly improved. no ulceration;  NECK: supple, no masses felt LYMPH:  no palpable lymphadenopathy in the cervical, axillary or inguinal regions LUNGS: clear to auscultation and  No wheeze or crackles HEART/CVS: regular rate & rhythm and no murmurs; No lower extremity  edema ABDOMEN: abdomen soft, non-tender and normal bowel sounds Musculoskeletal:no cyanosis of digits and no clubbing  PSYCH: alert & oriented x 3 with fluent speech; anxious NEURO: no focal motor/sensory deficits SKIN: No skin changes.  LABORATORY DATA:  I have reviewed the data as listed Lab Results  Component Value Date   WBC 17.4 (H) 07/22/2016   HGB 13.7 07/22/2016   HCT 40.0 07/22/2016   MCV 95.9 07/22/2016   PLT 464 (H) 07/22/2016    Recent Labs  07/11/16 1324 07/12/16 0551  07/14/16 0433 07/15/16 0655 07/22/16 0902  NA 134* 134*  < > 134* 135 134*  K 3.2* 3.3*  < > 3.4* 3.7 3.1*  CL 97* 100*  < > 101 101 105  CO2 27 24  < > 24 24 19*  GLUCOSE 105* 95  < > 155* 116* 116*  BUN 5* 5*  < > '19 15 12  '$ CREATININE 0.56 0.59  < > 0.56 0.67 0.55  CALCIUM 9.3 8.9  < > 9.3 9.4 9.1  GFRNONAA >60 >60  < > >60 >60 >60  GFRAA >60 >60  < > >60 >60 >60  PROT 7.3 6.4*  --   --   --  6.9  ALBUMIN 4.1 3.4*  --   --   --  3.9  AST 19 16  --   --   --  24  ALT 14 11*  --   --   --  13*  ALKPHOS 60 52  --   --   --  50  BILITOT 1.1 1.2  --   --   --  0.5  < > = values in this interval not displayed.   ASSESSMENT & PLAN:   Malignant neoplasm of right upper lobe of lung ([place])  # STAGE IV [tiny  focus of brain met] Adenocarcinoma T2N2; right upper lobe; s/p chemo-RT.  DEC 2017- Chest CT- Partial response. ; MRI DEC 2017- Brain metastasis- small new brain mets- evaluated by Dr.Crystal. No RT now; MRI Jan 2018 showed increasing size of metswith worsening edema. Continue steroids.   # HOLD treatment- sec to Poor/ declining performance status.  # worsening brain mets- on steroids- recommend taper 4 mg TID; given difficulty with sleeping.  # Pain control- Fentanyl patch; controlled; new script given.   # Oral thrush- Resolved.   # PN- Better. Continue PRN medications. Switch to Gabapentin- given   # PT/care program.  # Also discussed regarding CODE STATUS. No decisions made.  # follow up in appx 3 weeks/labs.      Cammie Sickle, MD 07/22/2016 4:57 PM

## 2016-07-22 NOTE — Assessment & Plan Note (Addendum)
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  DEC 2017- Chest CT- Partial response. ; MRI DEC 2017- Brain metastasis- small new brain mets- evaluated by Dr.Crystal. No RT now; MRI Jan 2018 showed increasing size of metswith worsening edema. Continue steroids.   # HOLD treatment- sec to Poor/ declining performance status.  # worsening brain mets- on steroids- recommend taper 4 mg TID; given difficulty with sleeping.  # Pain control- Fentanyl patch; controlled; new script given.   # Oral thrush- Resolved.   # PN- Better. Continue PRN medications. Switch to Gabapentin- given   # PT/care program.  # Also discussed regarding CODE STATUS. No decisions made.  # follow up in appx 3 weeks/labs.

## 2016-07-22 NOTE — Progress Notes (Signed)
Patient here for follow-up. She c/o white patches on tongue.  She declines treatment today.

## 2016-07-25 NOTE — Addendum Note (Signed)
Addended by: Renita Papa R on: 07/25/2016 09:00 AM   Modules accepted: Orders

## 2016-07-25 NOTE — Telephone Encounter (Signed)
[facility] care program ref submitted. Exercise with restrictions.

## 2016-07-26 ENCOUNTER — Telehealth: Payer: Self-pay | Admitting: *Deleted

## 2016-07-26 NOTE — Telephone Encounter (Signed)
Asking for orders for Social Work. [place] advise

## 2016-07-26 NOTE — Telephone Encounter (Signed)
Dr. Rogue Bussing would be willing to provide orders for social worker-could we give them a verbal and they fax over an order for him to sign?

## 2016-07-26 NOTE — Telephone Encounter (Signed)
I am calling the verbal order, they stated an order has been faxed already

## 2016-07-27 ENCOUNTER — Encounter: Payer: Self-pay | Admitting: Family Medicine

## 2016-07-27 ENCOUNTER — Ambulatory Visit (INDEPENDENT_AMBULATORY_CARE_PROVIDER_SITE_OTHER): Payer: Medicaid Other | Admitting: Family Medicine

## 2016-07-27 ENCOUNTER — Ambulatory Visit: Payer: Medicaid Other

## 2016-07-27 ENCOUNTER — Ambulatory Visit: Payer: Medicaid Other | Admitting: Internal Medicine

## 2016-07-27 VITALS — BP 118/70 | HR 80 | Ht 62.0 in | Wt 135.0 lb

## 2016-07-27 DIAGNOSIS — Z09 Encounter for follow-up examination after completed treatment for conditions other than malignant neoplasm: Secondary | ICD-10-CM

## 2016-07-27 DIAGNOSIS — B370 Candidal stomatitis: Secondary | ICD-10-CM | POA: Diagnosis not present

## 2016-07-27 DIAGNOSIS — T380X5A Adverse effect of glucocorticoids and synthetic analogues, initial encounter: Secondary | ICD-10-CM

## 2016-07-27 LAB — GLUCOSE, POCT (MANUAL RESULT ENTRY): POC GLUCOSE: 134 mg/dL — AB (ref 70–99)

## 2016-07-27 MED ORDER — NYSTATIN 100000 UNIT/ML MT SUSP: 5.0000 mL | Freq: Four times a day (QID) | OROMUCOSAL | 2 refills | Status: AC

## 2016-07-27 NOTE — Progress Notes (Signed)
Name: Tiffany Erickson   MRN: [id number]    DOB: 07/17/1970   Date:07/27/2016       Progress Note  Subjective  Chief Complaint  Chief Complaint  Patient presents with  . Follow-up    hospital d/c on 2/9- was admitted due to weakness- has home health coming in for vital checks    Patient presents for followup from hospitalization.    No problem-specific Assessment & Plan notes found for this encounter.   Past Medical History:  Diagnosis Date  . Anxiety   . Broken ribs 05/2015   5 ribs  . Chronic pancreatitis ([place])   . Concussion 06/01/2015   S/P fall-PT STATES IN PHONE INTERVIEW THAT SHE IS UNSURE IF SHE HAD A CONCUSSION OR NOT  . Frequent UTI    "sometimes" (06/01/2015)  . GERD (gastroesophageal reflux disease)   . Lung cancer ([place]) 07/13/15   METASTATIC ADENOCARCINOMA OF THE LUNG  . Lung mass    right side noted 06/01/2015  . Multiple fractures of ribs of right side 06/01/2015   "5" S/P fall  LEFT SIDE  . Shortness of breath dyspnea    5 rib fx right side    Past Surgical History:  Procedure Laterality Date  . ENDOBRONCHIAL ULTRASOUND N/A 07/13/2015   Procedure: ENDOBRONCHIAL ULTRASOUND;  Surgeon: Laverle Hobby, MD;  Location: [facility] ORS;  Service: Pulmonary;  Laterality: N/A;  . ESOPHAGOGASTRODUODENOSCOPY    . EYE MUSCLE SURGERY Left ~ 1979  . MYRINGOTOMY WITH TUBE PLACEMENT Bilateral   . PORTACATH PLACEMENT Left 07/27/2015   Procedure: INSERTION PORT-A-CATH;  Surgeon: [name], MD;  Location: [facility] ORS;  Service: General;  Laterality: Left;  . TONSILLECTOMY      Family History  Problem Relation Age of Onset  . Colon cancer Maternal Grandfather   . CAD Maternal Grandfather   . Pancreatic cancer Maternal Grandmother     Social History   Social History  . Marital status: Single    Spouse name: N/A  . Number of children: N/A  . Years of education: N/A   Occupational History  . Not on file.   Social History Main Topics  . Smoking status: Current Every  Day Smoker    Packs/day: 0.50    Years: 28.00    Types: Cigarettes  . Smokeless tobacco: Never Used  . Alcohol use 8.4 oz/week    2 Glasses of wine, 12 Cans of beer per week     Comment: 06/01/2015 "drink probably 4 times/wk; either ;4 beers or 2 glasses of wine when I do drink"  . Drug use: No  . Sexual activity: Not Currently   Other Topics Concern  . Not on file   Social History Narrative  . No narrative on file    Allergies  Allergen Reactions  . Sulfa Antibiotics Other (See Comments)    Unknown-last had as a child    Outpatient Medications Prior to Visit  Medication Sig Dispense Refill  . albuterol (PROVENTIL HFA;VENTOLIN HFA) 108 (90 Base) MCG/ACT inhaler Inhale 2 puffs into the lungs every 6 (six) hours as needed for wheezing or shortness of breath. 1 Inhaler 2  . ALPRAZolam (XANAX) 0.5 MG tablet Take 1 tablet (0.5 mg total) by mouth 3 (three) times daily as needed for anxiety. 15 tablet 0  . alprazolam (XANAX) 2 MG tablet Take 1 tablet by mouth every 6 (six) hours as needed. Alternates with 0.5 mg  2  . dexamethasone (DECADRON) 4 MG tablet Take 1 tablet (4  mg total) by mouth 4 (four) times daily. 120 tablet 0  . fentaNYL (DURAGESIC - DOSED MCG/HR) 50 MCG/HR Place 1 patch (50 mcg total) onto the skin every 3 (three) days. 10 patch 0  . gabapentin (NEURONTIN) 300 MG capsule Take 1 capsule (300 mg total) by mouth 2 (two) times daily. 60 capsule 2  . HYDROcodone-acetaminophen (NORCO/VICODIN) 5-325 MG tablet Take 1 tablet by mouth every 8 (eight) hours as needed for moderate pain. 90 tablet 0  . metoCLOPramide (REGLAN) 10 MG tablet Take 1 tablet (10 mg total) by mouth every 8 (eight) hours as needed for nausea or vomiting. 90 tablet 5  . ondansetron (ZOFRAN) 8 MG tablet Take 1 tablet by mouth every 8 (eight) hours as needed.  2  . pantoprazole (PROTONIX) 20 MG tablet Take 1 tablet (20 mg total) by mouth 2 (two) times daily. 60 tablet 6  . pramipexole (MIRAPEX) 0.5 MG tablet  TAKE 1 TABLET BY MOUTH AT BEDTIME  2  . promethazine (PHENERGAN) 25 MG tablet Take 25 mg by mouth every 8 (eight) hours as needed for nausea or vomiting.    . nystatin (MYCOSTATIN) 100000 UNIT/ML suspension Take 5 mLs by mouth 4 (four) times daily.     Facility-Administered Medications Prior to Visit  Medication Dose Route Frequency Provider Last Rate Last Dose  . sodium chloride flush (NS) 0.9 % injection 10 mL  10 mL Intravenous PRN Cammie Sickle, MD   10 mL at 03/02/16 1107    Review of Systems  Constitutional: Negative for chills, fever, malaise/fatigue and weight loss.  HENT: Negative for ear discharge, ear pain and sore throat.   Eyes: Negative for blurred vision.  Respiratory: Negative for cough, sputum production, shortness of breath and wheezing.   Cardiovascular: Negative for chest pain, palpitations and leg swelling.  Gastrointestinal: Negative for abdominal pain, blood in stool, constipation, diarrhea, heartburn, melena and nausea.  Genitourinary: Negative for dysuria, frequency, hematuria and urgency.  Musculoskeletal: Negative for back pain, joint pain, myalgias and neck pain.  Skin: Negative for rash.  Neurological: Negative for dizziness, tingling, sensory change, focal weakness and headaches.  Endo/Heme/Allergies: Negative for environmental allergies and polydipsia. Does not bruise/bleed easily.  Psychiatric/Behavioral: Negative for depression and suicidal ideas. The patient is not nervous/anxious and does not have insomnia.      Objective  Vitals:   07/27/16 1107  BP: 118/70  Pulse: 80  Weight: 135 lb (61.2 kg)  Height: '5\' 2"'$  (1.575 m)    Physical Exam  Constitutional: She is well-developed, well-nourished, and in no distress. No distress.  HENT:  Head: Normocephalic and atraumatic.  Right Ear: External ear normal.  Left Ear: External ear normal.  Nose: Nose normal.  Mouth/Throat: Oropharynx is clear and moist.  Eyes: Conjunctivae and EOM are  normal. Pupils are equal, round, and reactive to light. Right eye exhibits no discharge. Left eye exhibits no discharge.  Neck: Normal range of motion. Neck supple. No JVD present. No thyromegaly present.  Cardiovascular: Normal rate, regular rhythm, normal heart sounds and intact distal pulses.  Exam reveals no gallop and no friction rub.   No murmur heard. Pulmonary/Chest: Effort normal and breath sounds normal. She has no wheezes. She has no rales.  Abdominal: Soft. Bowel sounds are normal. She exhibits no mass. There is no tenderness. There is no guarding.  Musculoskeletal: Normal range of motion. She exhibits no edema.  Lymphadenopathy:    She has no cervical adenopathy.  Neurological: She is alert.  Skin:  Skin is warm and dry. She is not diaphoretic.  Psychiatric: Mood and affect normal.      Assessment & Plan  Problem List Items Addressed This Visit    None    Visit Diagnoses    Hospital discharge follow-up    -  Primary   Oral thrush       Relevant Medications   nystatin (MYCOSTATIN) 100000 UNIT/ML suspension   Adverse effect of corticosteroids, initial encounter       visual distortion   Relevant Orders   POCT Glucose (CBG) (Completed)      Meds ordered this encounter  Medications  . nystatin (MYCOSTATIN) 100000 UNIT/ML suspension    Sig: Take 5 mLs (500,000 Units total) by mouth 4 (four) times daily.    Dispense:  60 mL    Refill:  2      Dr. Mac[facility]  07/27/16

## 2016-08-08 ENCOUNTER — Telehealth: Payer: Self-pay | Admitting: *Deleted

## 2016-08-08 ENCOUNTER — Other Ambulatory Visit: Payer: Self-pay

## 2016-08-08 NOTE — Telephone Encounter (Signed)
Ok to add pt for iv fluids. If patient comes on Thursday 3/8, she would be a lab/possible 2 hrs of IV fluids only. She would not see the provider on this day. Labs for 3/12 can be used on 3/8.  Or, you may give the patient the option to be added to infusion schedule next Monday 08/15/16 (if infusion chairs are available).

## 2016-08-08 NOTE — Telephone Encounter (Signed)
Patient would like to get IVF on Monday 3/12 after lab/md. Message sent to schedulers to add appt for 2 hours of IVF

## 2016-08-08 NOTE — Telephone Encounter (Signed)
Asking if she can get IVF when she comes in for labs on Friday. She is not down until Monday for Lab / md. Please advise

## 2016-08-09 ENCOUNTER — Other Ambulatory Visit: Payer: Self-pay

## 2016-08-09 MED ORDER — PRAMIPEXOLE DIHYDROCHLORIDE 0.5 MG PO TABS: 0.5000 mg | ORAL_TABLET | Freq: Every day | ORAL | 2 refills | Status: AC

## 2016-08-15 ENCOUNTER — Inpatient Hospital Stay: Payer: Medicaid Other

## 2016-08-15 ENCOUNTER — Inpatient Hospital Stay: Payer: Medicaid Other | Admitting: Internal Medicine

## 2016-08-15 NOTE — Assessment & Plan Note (Deleted)
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  DEC 2017- Chest CT- Partial response. ; MRI DEC 2017- Brain metastasis- small new brain mets- evaluated by Dr.Crystal. No RT now; MRI Jan 2018 showed increasing size of metswith worsening edema. Continue steroids.   # HOLD treatment- sec to Poor/ declining performance status.  # worsening brain mets- on steroids- recommend taper 4 mg TID; given difficulty with sleeping.  # Pain control- Fentanyl patch; controlled; new script given.   # Oral thrush- Resolved.   # PN- Better. Continue PRN medications. Switch to Gabapentin- given   # PT/care program.  # Also discussed regarding CODE STATUS. No decisions made.  # follow up in appx 3 weeks/labs.

## 2016-08-15 NOTE — Progress Notes (Deleted)
[facility] NOTE  Patient Care Team: Juline Patch, MD as PCP - General (Family Medicine) Seeplaputhur Robinette Haines, MD (General Surgery)  CHIEF COMPLAINTS/PURPOSE OF CONSULTATION:   Oncology History   # FEB 2017- ADENOCARCINOMA RUL [T2N2; STAGE III Bronch/FNA- Right paratracheal LN;Dr.Ram]. PET- no distant mets; March 3rd START CARBO-TAXOL Weekly +RT [until may 5th]; June 21st 2017- last dose of consolidation carbo-taxol; Jan 12 2016- PET- Improved on RUL mass/LN  # FEB 2017- MRI Brain 56m focus of met [Right frontal]; Jan 12 2016- 3 new brain mets [start WBRT 8/31]; MRI- DEC 2017- New occipital met-~566m RT on HOLD.   # JAN 2018- START OPIDIVO  # left breast abscess; Aug 2017- PET ? Right Breast abcess  # "Chronic pancreatitis"/ smoker [15ppt]   # MOLECULAR STUDIES: ALK-NEG;ROS-1-NEG     Malignant neoplasm of right upper lobe of lung ([facility]  07/30/2015 Initial Diagnosis    Malignant neoplasm of right upper lobe of lung (HCC)        HISTORY OF PRESENTING ILLNESS:  Tiffany Erickson [age].o.  female  history of stage IV lung cancer adenocarcinoma- Currently on surveillance is here for follow-up.  In the interim patient was admitted to the hospital for generalized weakness; noted to have progressive brain metastases. Patient is currently on steroids dexamethasone 4 mg every 6 hours. Patient complains of difficulty sleeping at night. Denies any difficulty swallowing no nausea or vomiting. No headaches. She does complains of generalized weakness. Patient was evaluated by PaSaralyn Pilarare in the hospital. Declined hospice. Patient is interested in physical therapy at the hospital.   ROS: A complete 10 point review of system is done which is negative except mentioned above in history of present illness  MEDICAL HISTORY:  Past Medical History:  Diagnosis Date  . Anxiety   . Broken ribs 05/2015   5 ribs  . Chronic pancreatitis ([facility]  . Concussion 06/01/2015   S/P  fall-PT STATES IN PHONE INTERVIEW THAT SHE IS UNSURE IF SHE HAD A CONCUSSION OR NOT  . Frequent UTI    "sometimes" (06/01/2015)  . GERD (gastroesophageal reflux disease)   . [place]   METASTATIC ADENOCARCINOMA OF THE LUNG  . Lung mass    right side noted 06/01/2015  . Multiple fractures of ribs of right side 06/01/2015   "5" S/P fall  LEFT SIDE  . Shortness of breath dyspnea    5 rib fx right side    SURGICAL HISTORY: Past Surgical History:  Procedure Laterality Date  . ENDOBRONCHIAL ULTRASOUND N/A 07/13/2015   Procedure: ENDOBRONCHIAL ULTRASOUND;  Surgeon: PrLaverle HobbyMD;  Location: [facility] ORS;  Service: Pulmonary;  Laterality: N/A;  . ESOPHAGOGASTRODUODENOSCOPY    . EYE MUSCLE SURGERY Left ~ 1979  . MYRINGOTOMY WITH TUBE PLACEMENT Bilateral   . PORTACATH PLACEMENT Left 07/27/2015   Procedure: INSERTION PORT-A-CATH;  Surgeon: TiNestor LewandowskyMD;  Location: [facility] ORS;  Service: General;  Laterality: Left;  . TONSILLECTOMY      SOCIAL HISTORY: Social History   Social History  . Marital status: Single    Spouse name: N/A  . Number of children: N/A  . Years of education: N/A   Occupational History  . Not on file.   Social History Main Topics  . Smoking status: Current Every Day Smoker    Packs/day: 0.50    Years: 28.00    Types: Cigarettes  . Smokeless tobacco: Never Used  . Alcohol use 8.4 oz/week  2 Glasses of wine, 12 Cans of beer per week     Comment: 06/01/2015 "drink probably 4 times/wk; either ;4 beers or 2 glasses of wine when I do drink"  . Drug use: No  . Sexual activity: Not Currently   Other Topics Concern  . Not on file   Social History Narrative  . No narrative on file    FAMILY HISTORY: Family History  Problem Relation Age of Onset  . Colon cancer Maternal Grandfather   . CAD Maternal Grandfather   . Pancreatic cancer Maternal Grandmother     ALLERGIES:  is allergic to sulfa antibiotics.  MEDICATIONS:  Current  Outpatient Prescriptions  Medication Sig Dispense Refill  . albuterol (PROVENTIL HFA;VENTOLIN HFA) 108 (90 Base) MCG/ACT inhaler Inhale 2 puffs into the lungs every 6 (six) hours as needed for wheezing or shortness of breath. 1 Inhaler 2  . ALPRAZolam (XANAX) 0.5 MG tablet Take 1 tablet (0.5 mg total) by mouth 3 (three) times daily as needed for anxiety. 15 tablet 0  . alprazolam (XANAX) 2 MG tablet Take 1 tablet by mouth every 6 (six) hours as needed. Alternates with 0.5 mg  2  . dexamethasone (DECADRON) 4 MG tablet Take 1 tablet (4 mg total) by mouth 4 (four) times daily. 120 tablet 0  . fentaNYL (DURAGESIC - DOSED MCG/HR) 50 MCG/HR Place 1 patch (50 mcg total) onto the skin every 3 (three) days. 10 patch 0  . gabapentin (NEURONTIN) 300 MG capsule Take 1 capsule (300 mg total) by mouth 2 (two) times daily. 60 capsule 2  . HYDROcodone-acetaminophen (NORCO/VICODIN) 5-325 MG tablet Take 1 tablet by mouth every 8 (eight) hours as needed for moderate pain. 90 tablet 0  . metoCLOPramide (REGLAN) 10 MG tablet Take 1 tablet (10 mg total) by mouth every 8 (eight) hours as needed for nausea or vomiting. 90 tablet 5  . nystatin (MYCOSTATIN) 100000 UNIT/ML suspension Take 5 mLs (500,000 Units total) by mouth 4 (four) times daily. 60 mL 2  . ondansetron (ZOFRAN) 8 MG tablet Take 1 tablet by mouth every 8 (eight) hours as needed.  2  . pantoprazole (PROTONIX) 20 MG tablet Take 1 tablet (20 mg total) by mouth 2 (two) times daily. 60 tablet 6  . pramipexole (MIRAPEX) 0.5 MG tablet Take 1 tablet (0.5 mg total) by mouth at bedtime. 30 tablet 2  . promethazine (PHENERGAN) 25 MG tablet Take 25 mg by mouth every 8 (eight) hours as needed for nausea or vomiting.     No current facility-administered medications for this visit.    Facility-Administered Medications Ordered in Other Visits  Medication Dose Route Frequency Provider Last Rate Last Dose  . sodium chloride flush (NS) 0.9 % injection 10 mL  10 mL Intravenous  PRN Cammie Sickle, MD   10 mL at 03/02/16 1107      .  PHYSICAL EXAMINATION: ECOG PERFORMANCE STATUS: 0 - Asymptomatic  There were no vitals filed for this visit. There were no vitals filed for this visit.  GENERAL: Thin build; cachectic-appearing female patient Alert, no distress and comfortable.She Is alone. She is walking herself. EYES: no pallor or icterus OROPHARYNX: No thrush significantly improved. no ulceration;  NECK: supple, no masses felt LYMPH:  no palpable lymphadenopathy in the cervical, axillary or inguinal regions LUNGS: clear to auscultation and  No wheeze or crackles HEART/CVS: regular rate & rhythm and no murmurs; No lower extremity edema ABDOMEN: abdomen soft, non-tender and normal bowel sounds Musculoskeletal:no cyanosis of digits  and no clubbing  PSYCH: alert & oriented x 3 with fluent speech; anxious NEURO: no focal motor/sensory deficits SKIN: No skin changes.  LABORATORY DATA:  I have reviewed the data as listed Lab Results  Component Value Date   WBC 17.4 (H) 07/22/2016   HGB 13.7 07/22/2016   HCT 40.0 07/22/2016   MCV 95.9 07/22/2016   PLT 464 (H) 07/22/2016    Recent Labs  07/11/16 1324 07/12/16 0551  07/14/16 0433 07/15/16 0655 07/22/16 0902  NA 134* 134*  < > 134* 135 134*  K 3.2* 3.3*  < > 3.4* 3.7 3.1*  CL 97* 100*  < > 101 101 105  CO2 27 24  < > 24 24 19*  GLUCOSE 105* 95  < > 155* 116* 116*  BUN 5* 5*  < > _0 CREATININE 0.56 0.59  < > 0.56 0.67 0.55  CALCIUM 9.3 8.9  < > 9.3 9.4 9.1  GFRNONAA >60 >60  < > >60 >60 >60  GFRAA >60 >60  < > >60 >60 >60  PROT 7.3 6.4*  --   --   --  6.9  ALBUMIN 4.1 3.4*  --   --   --  3.9  AST 19 16  --   --   --  24  ALT 14 11*  --   --   --  13*  ALKPHOS 60 52  --   --   --  50  BILITOT 1.1 1.2  --   --   --  0.5  < > = values in this interval not displayed.   ASSESSMENT & PLAN:   No problem-specific Assessment & Plan notes found for this encounter.     Cammie Sickle, MD 08/15/2016 8:37 AM

## 2016-08-16 ENCOUNTER — Telehealth: Payer: Self-pay | Admitting: *Deleted

## 2016-08-16 NOTE — Telephone Encounter (Signed)
[name] notified of Heather[place] conversation

## 2016-08-16 NOTE — Telephone Encounter (Signed)
Called to request that we call patient to see if she will agree to Hospice services now since she missed her appt yesterday. She had called [facility] demanding DME be delivered to her and they could not comply with her demands because she is not hospice. She stated she was having a panic attack and was able to calm her down. She reports that she faxed over an order for requesting hospice. The plan is to see her Wed and open her to Hospice on Thursday if she will agree to hospice. [name] requests a call back with the outcome of hospice discussion [phone]

## 2016-08-16 NOTE — Telephone Encounter (Signed)
Spoke with patient. She is agreeable to hospice services. Will send referral to hospice this morning. Patient states that she missed her apt yesterday due to the inclement weather and "not feeling well" stated that she "just wants to be set up for lab and iv fluids on Friday."  She declines an MD visit as she only wanted to tell md that she "now wants hospice care." She states that "it's not necessary to see Dr. Jacinto Reap."

## 2016-08-19 ENCOUNTER — Inpatient Hospital Stay: Payer: Medicaid Other | Attending: Internal Medicine

## 2016-08-19 ENCOUNTER — Inpatient Hospital Stay: Payer: Medicaid Other | Admitting: *Deleted

## 2016-08-19 VITALS — BP 134/89 | HR 128 | Resp 16

## 2016-08-19 DIAGNOSIS — C7931 Secondary malignant neoplasm of brain: Secondary | ICD-10-CM | POA: Diagnosis not present

## 2016-08-19 DIAGNOSIS — C3411 Malignant neoplasm of upper lobe, right bronchus or lung: Secondary | ICD-10-CM

## 2016-08-19 DIAGNOSIS — Z9221 Personal history of antineoplastic chemotherapy: Secondary | ICD-10-CM | POA: Diagnosis not present

## 2016-08-19 DIAGNOSIS — Z923 Personal history of irradiation: Secondary | ICD-10-CM | POA: Diagnosis not present

## 2016-08-19 DIAGNOSIS — E876 Hypokalemia: Secondary | ICD-10-CM

## 2016-08-19 DIAGNOSIS — C771 Secondary and unspecified malignant neoplasm of intrathoracic lymph nodes: Secondary | ICD-10-CM | POA: Diagnosis not present

## 2016-08-19 MED ORDER — SODIUM CHLORIDE 0.9 % IV SOLN
Freq: Once | INTRAVENOUS | Status: AC
  Administered 2016-08-19: 14:00:00 via INTRAVENOUS
  Filled 2016-08-19: qty 1000

## 2016-08-19 MED ORDER — SODIUM CHLORIDE 0.9 % IJ SOLN
10.0000 mL | INTRAMUSCULAR | Status: DC | PRN
  Administered 2016-08-19: 10 mL
  Filled 2016-08-19: qty 10

## 2016-08-19 MED ORDER — HEPARIN SOD (PORK) LOCK FLUSH 100 UNIT/ML IV SOLN
500.0000 [IU] | Freq: Once | INTRAVENOUS | Status: AC | PRN
  Administered 2016-08-19: 500 [IU]
  Filled 2016-08-19: qty 5

## 2016-09-12 ENCOUNTER — Ambulatory Visit: Payer: Self-pay | Admitting: Family Medicine

## 2016-10-21 ENCOUNTER — Telehealth: Payer: Self-pay | Admitting: *Deleted

## 2016-11-04 NOTE — Telephone Encounter (Signed)
Called to report that patient expired this morning at 2:05 AM at the hospice home

## 2016-11-04 DEATH — deceased

## 2016-12-10 ENCOUNTER — Other Ambulatory Visit: Payer: Self-pay | Admitting: Nurse Practitioner

## 2017-08-15 IMAGING — DX DG CHEST 1V PORT
1 series · 1 of 1 positions shown · non-contrast
Comparison: 07/13/2015 and earlier.

CLINICAL DATA: Left subclavian Port-A-Cath insertion. Right upper
lobe lung cancer.

EXAM:
PORTABLE CHEST 1 VIEW

[chest ap]
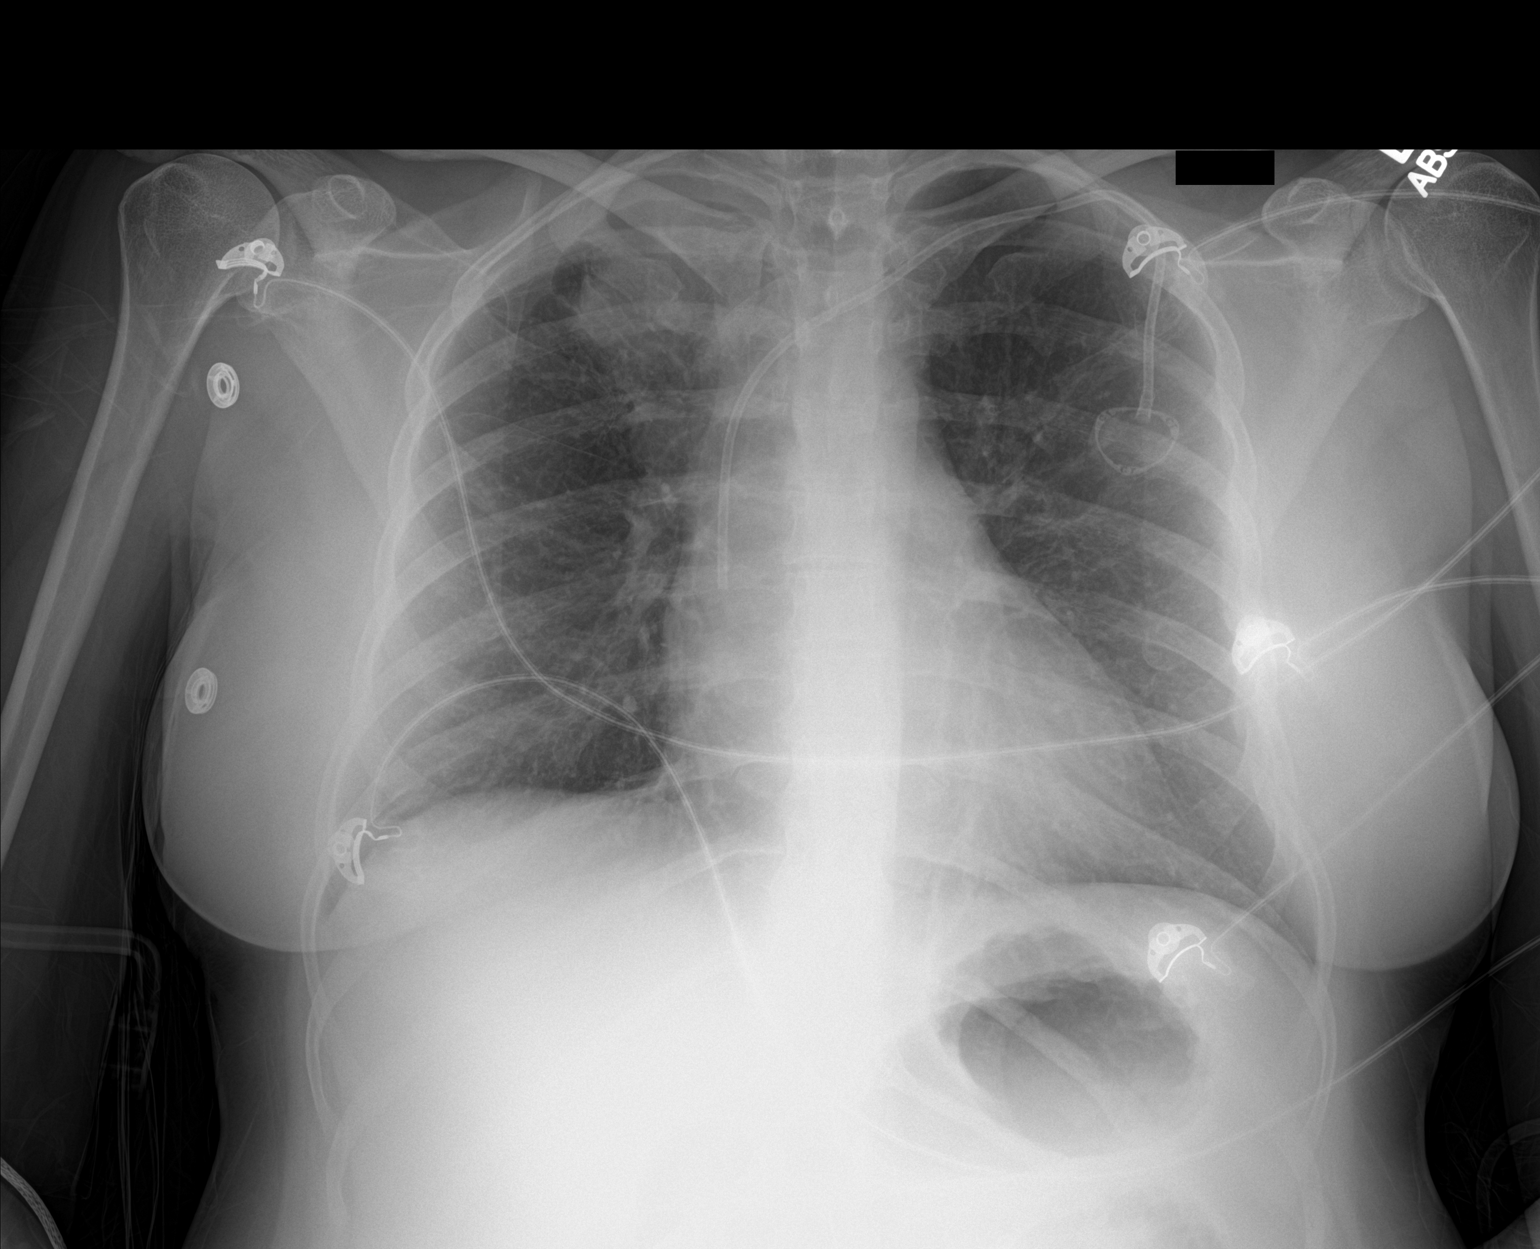

[1 of 1 positions shown; findings below may reference images not displayed]

FINDINGS: Left subclavian Port-A-Cath tip projects over the lower SVC near the
cavoatrial junction. No evidence of pneumothorax or mediastinal
hematoma. Right upper lobe lung mass as on prior examinations. No
new pulmonary parenchymal abnormalities.
IMPRESSION: 1. Left subclavian Port-A-Cath tip projects over the lower SVC. No
acute complicating features.
2. Right upper lobe lung mass as noted previously. No new/acute
cardiopulmonary disease.

## 2018-06-06 IMAGING — CT CT CHEST W/ CM
2 of 3 series · 15 of 36 positions shown, 18 images · IV contrast (iopamidol)
Comparison: 01/05/2016

CLINICAL DATA: Primary lung cancer.

EXAM:
CT CHEST WITH CONTRAST
TECHNIQUE: Multidetector CT imaging of the chest was performed during
intravenous contrast administration.
CONTRAST:  75mL FEFMM0-QDD IOPAMIDOL (FEFMM0-QDD) INJECTION 61%

[Series 2: axial st · axial · 0.61mm/px · z∈[-303,-63]mm · 12 of 142 slices shown, 15 images]
[im 11/142  mediastinal]
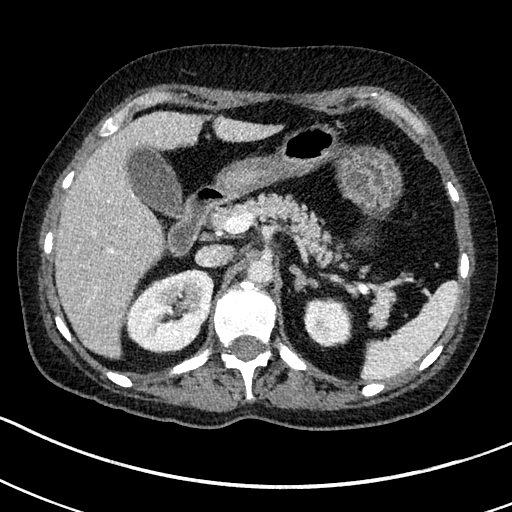
[im 11/142  lung]
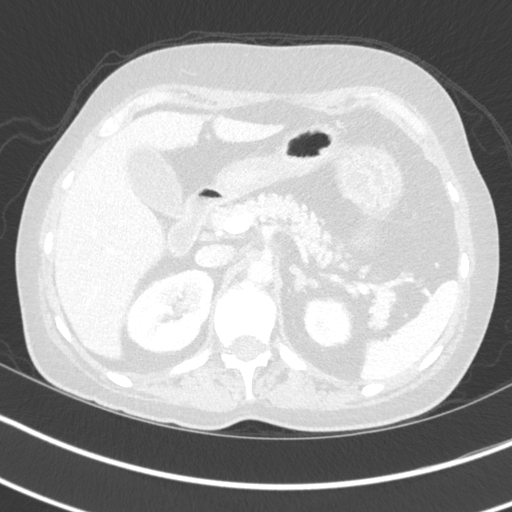
[im 21/142  lung]
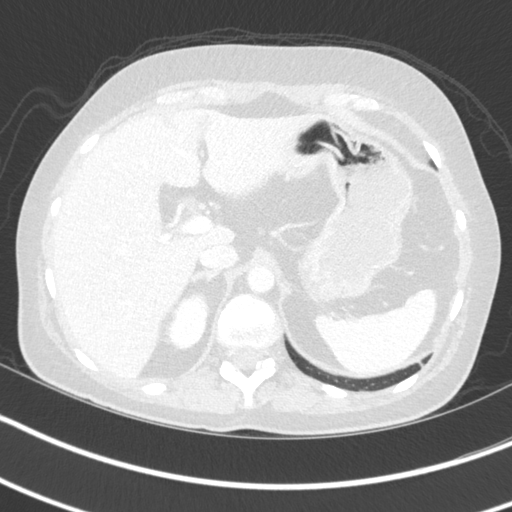
[im 32/142  lung]
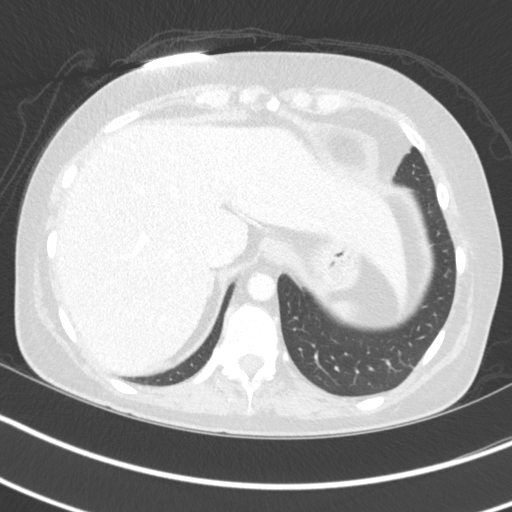
[im 42/142  lung]
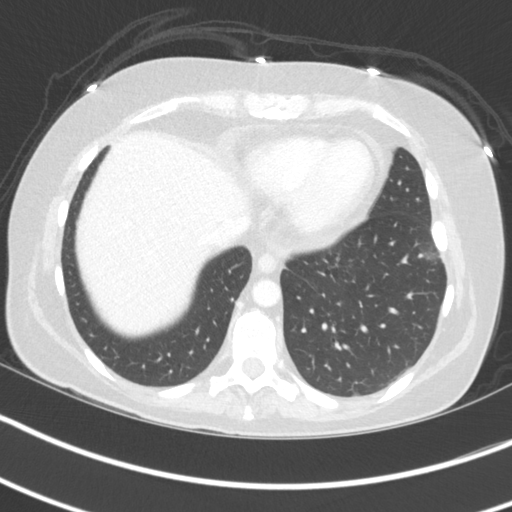
[im 53/142  mediastinal]
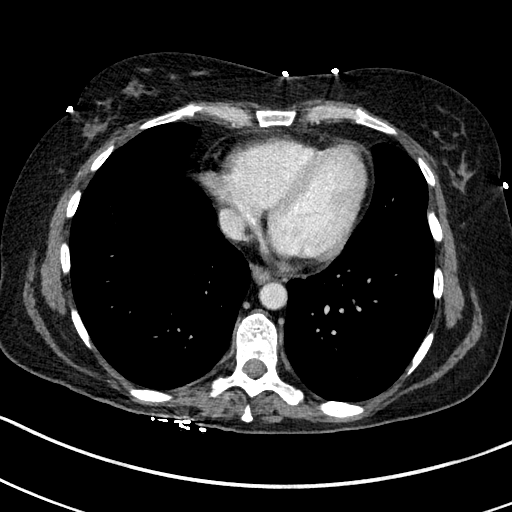
[im 53/142  lung]
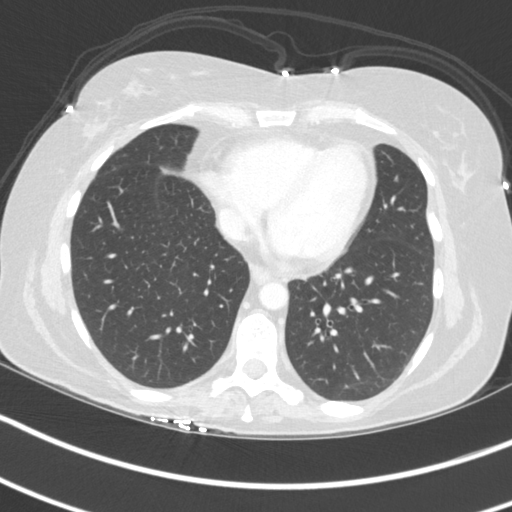
[im 63/142  lung]
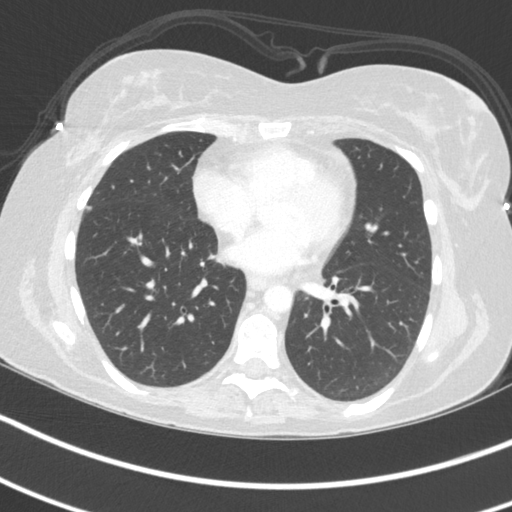
[im 79/142  lung]
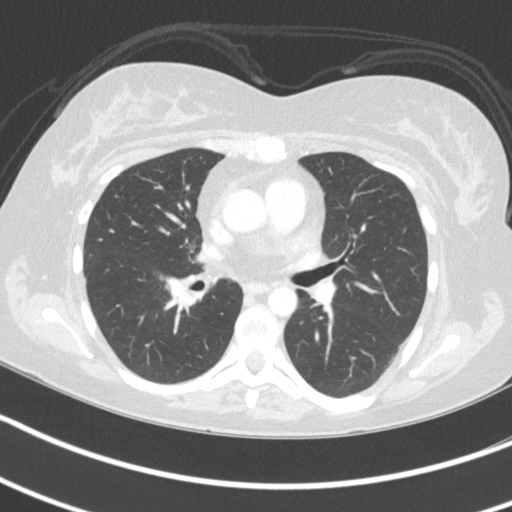
[im 89/142  lung]
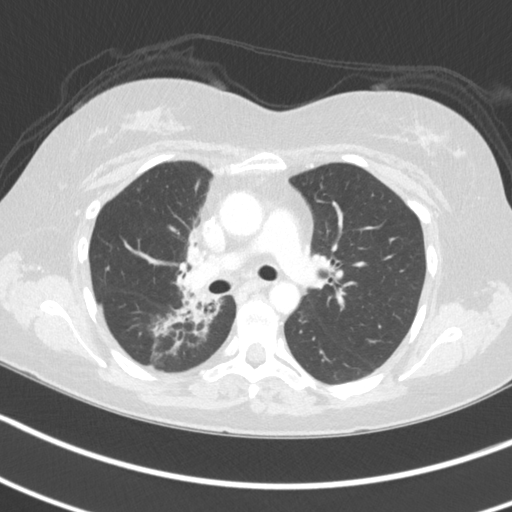
[im 100/142  mediastinal]
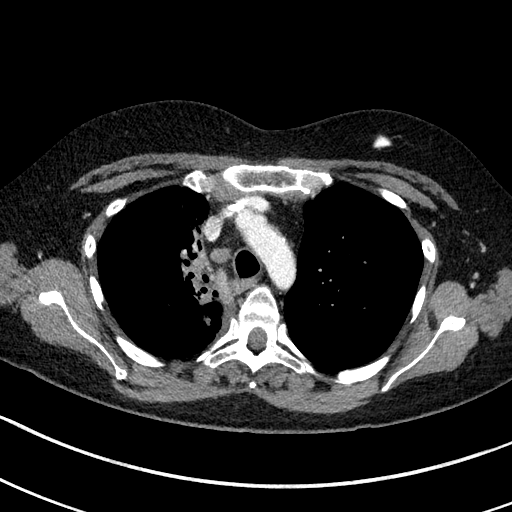
[im 100/142  lung]
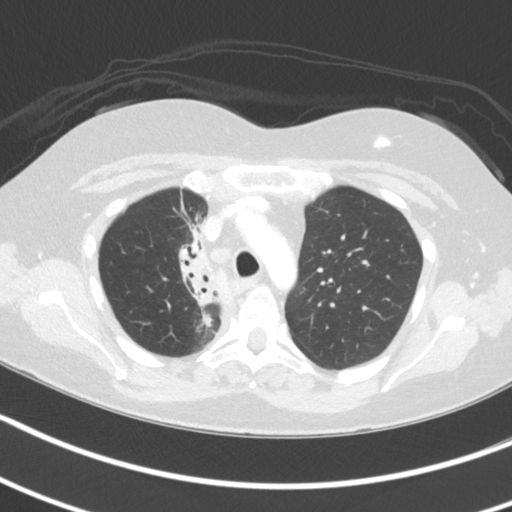
[im 110/142  lung]
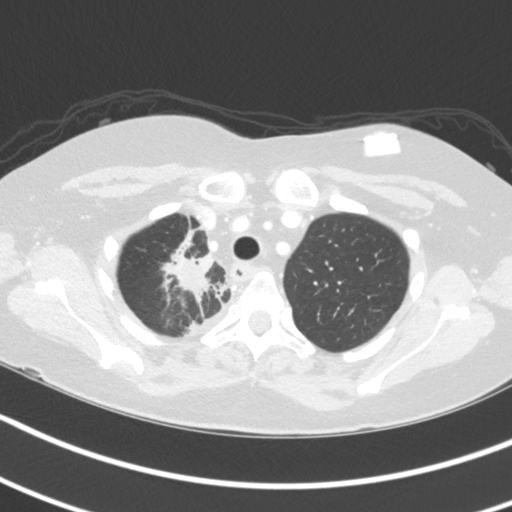
[im 121/142  lung]
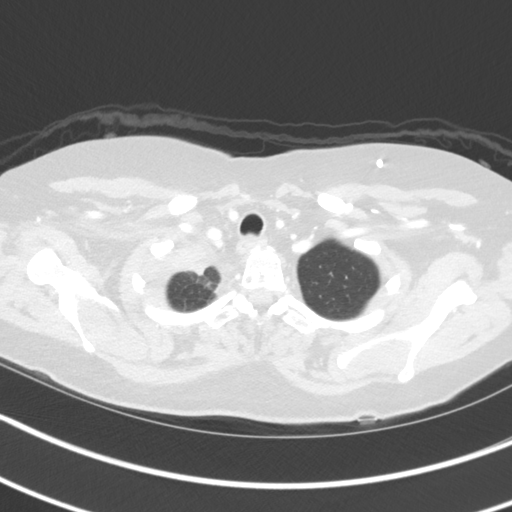
[im 131/142  lung]
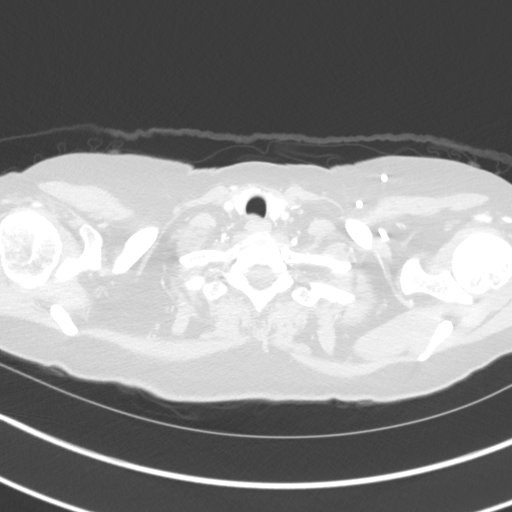

[Series 5: coronal · coronal · 0.60mm/px · 3 of 115 slices shown]
[im 23/115  lung]
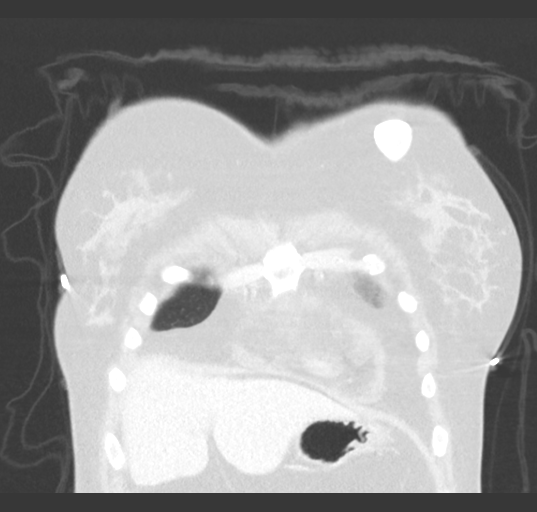
[im 46/115  lung]
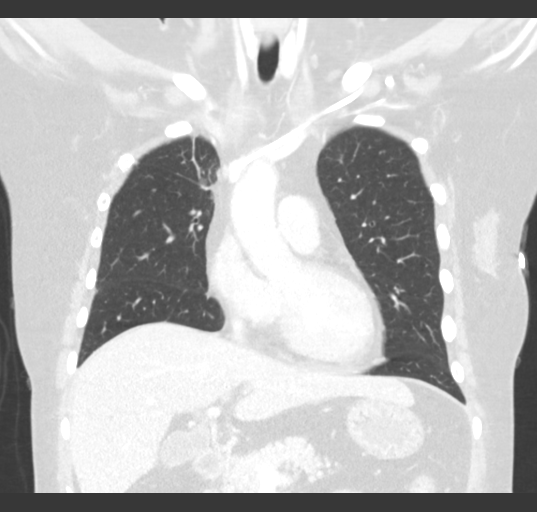
[im 69/115  lung]
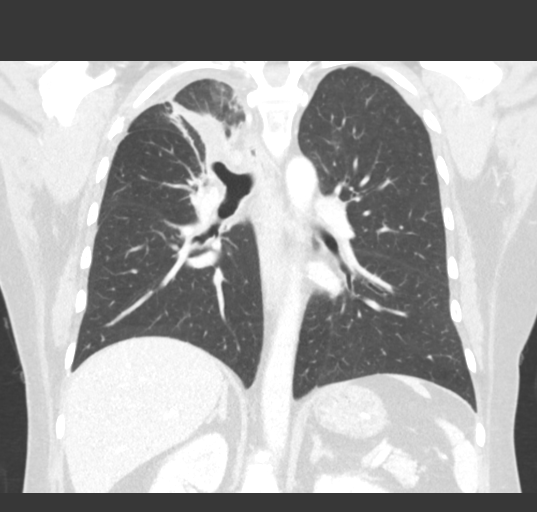

[15 of 36 positions shown; findings below may reference images not displayed]

FINDINGS: Cardiovascular: No pericardial effusion identified. Normal heart
size. Aortic atherosclerosis.

Mediastinum/Nodes: The trachea appears patent and is midline. Normal
appearance of the esophagus. Right paratracheal lymph node measures
9 mm, image 43 of series 2. Previously 11 mm

Lungs/Pleura: Architectural distortion, fibrosis and consolidation
within the paramediastinal right lung identified compatible with
changes of external beam radiation. The index lesion within the area
of radiation change measures 1.8 x 1.7 cm, image 30 of series 2.
Previously 2.0 x 3.0 cm.

Upper Abdomen: No acute abnormality.

Musculoskeletal: No chest wall abnormality. No acute or significant
osseous findings.
IMPRESSION: 1. Interval decrease in size of index lesion within the right upper
lobe.
2. Interval development of changes secondary to external beam
radiation within the paramediastinal right lung.
3. Previous a borderline right paratracheal lymph node has decreased
in size and now measures 9 mm.
